# Patient Record
Sex: Female | Born: 1975 | Race: White | Hispanic: No | Marital: Married | Smoking: Former smoker
Health system: Southern US, Community
[De-identification: ages and names within clinical notes are randomized; demographics above are authoritative.]

## PROBLEM LIST (undated history)

## (undated) DIAGNOSIS — I350 Nonrheumatic aortic (valve) stenosis: Secondary | ICD-10-CM

## (undated) DIAGNOSIS — R519 Headache, unspecified: Secondary | ICD-10-CM

## (undated) DIAGNOSIS — Q231 Congenital insufficiency of aortic valve: Secondary | ICD-10-CM

## (undated) DIAGNOSIS — R739 Hyperglycemia, unspecified: Secondary | ICD-10-CM

## (undated) DIAGNOSIS — M1990 Unspecified osteoarthritis, unspecified site: Secondary | ICD-10-CM

## (undated) DIAGNOSIS — I712 Thoracic aortic aneurysm, without rupture: Secondary | ICD-10-CM

## (undated) DIAGNOSIS — E0590 Thyrotoxicosis, unspecified without thyrotoxic crisis or storm: Secondary | ICD-10-CM

## (undated) DIAGNOSIS — J45909 Unspecified asthma, uncomplicated: Secondary | ICD-10-CM

## (undated) DIAGNOSIS — R51 Headache: Secondary | ICD-10-CM

## (undated) DIAGNOSIS — Z8619 Personal history of other infectious and parasitic diseases: Secondary | ICD-10-CM

## (undated) DIAGNOSIS — Q230 Congenital stenosis of aortic valve: Secondary | ICD-10-CM

## (undated) DIAGNOSIS — E042 Nontoxic multinodular goiter: Secondary | ICD-10-CM

## (undated) DIAGNOSIS — Q2381 Bicuspid aortic valve: Secondary | ICD-10-CM

## (undated) DIAGNOSIS — Z8632 Personal history of gestational diabetes: Secondary | ICD-10-CM

## (undated) DIAGNOSIS — R12 Heartburn: Secondary | ICD-10-CM

## (undated) DIAGNOSIS — J302 Other seasonal allergic rhinitis: Secondary | ICD-10-CM

## (undated) DIAGNOSIS — I7121 Aneurysm of the ascending aorta, without rupture: Secondary | ICD-10-CM

## (undated) HISTORY — DX: Personal history of other infectious and parasitic diseases: Z86.19

## (undated) HISTORY — DX: Headache, unspecified: R51.9

## (undated) HISTORY — DX: Nonrheumatic aortic (valve) stenosis: I35.0

## (undated) HISTORY — DX: Unspecified osteoarthritis, unspecified site: M19.90

## (undated) HISTORY — DX: Nontoxic multinodular goiter: E04.2

## (undated) HISTORY — DX: Aneurysm of the ascending aorta, without rupture: I71.21

## (undated) HISTORY — PX: BREAST REDUCTION SURGERY: SHX8

## (undated) HISTORY — DX: Personal history of gestational diabetes: Z86.32

## (undated) HISTORY — DX: Bicuspid aortic valve: Q23.81

## (undated) HISTORY — DX: Unspecified asthma, uncomplicated: J45.909

## (undated) HISTORY — DX: Heartburn: R12

## (undated) HISTORY — DX: Headache: R51

## (undated) HISTORY — DX: Other seasonal allergic rhinitis: J30.2

## (undated) HISTORY — DX: Thyrotoxicosis, unspecified without thyrotoxic crisis or storm: E05.90

## (undated) HISTORY — PX: OTHER SURGICAL HISTORY: SHX169

## (undated) HISTORY — DX: Hyperglycemia, unspecified: R73.9

## (undated) HISTORY — DX: Congenital insufficiency of aortic valve: Q23.1

## (undated) HISTORY — DX: Congenital stenosis of aortic valve: Q23.0

## (undated) HISTORY — DX: Thoracic aortic aneurysm, without rupture: I71.2

---

## 2008-08-29 ENCOUNTER — Inpatient Hospital Stay (HOSPITAL_COMMUNITY): Admission: AD | Admit: 2008-08-29 | Discharge: 2008-08-29 | Payer: Self-pay | Admitting: Obstetrics and Gynecology

## 2008-10-05 ENCOUNTER — Ambulatory Visit: Payer: Self-pay | Admitting: Unknown Physician Specialty

## 2009-04-03 ENCOUNTER — Encounter: Admission: RE | Admit: 2009-04-03 | Discharge: 2009-04-03 | Payer: Self-pay | Admitting: Family Medicine

## 2009-04-03 IMAGING — US US SOFT TISSUE HEAD/NECK
1 series · 14 of 25 positions shown · non-contrast
Comparison: None

CLINICAL DATA: Thyromegaly.

THYROID ULTRASOUND
TECHNIQUE: Ultrasound examination of the thyroid gland and
adjacent soft tissues was performed.

[Series 1: us soft tissue head/neck · 0.07mm/px · 14 of 29 slices shown]
[im 1/29]
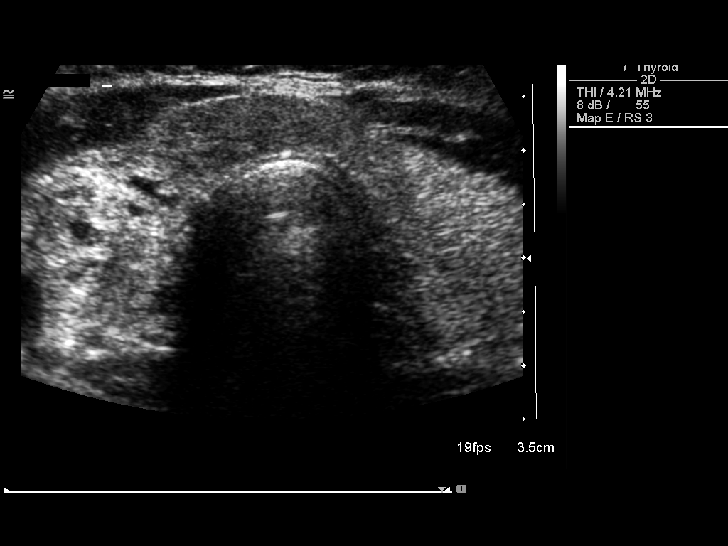
[im 3/29]
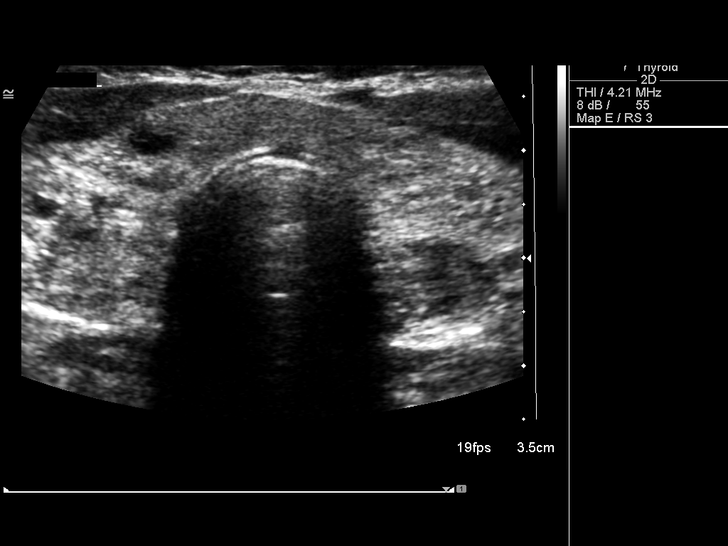
[im 5/29]
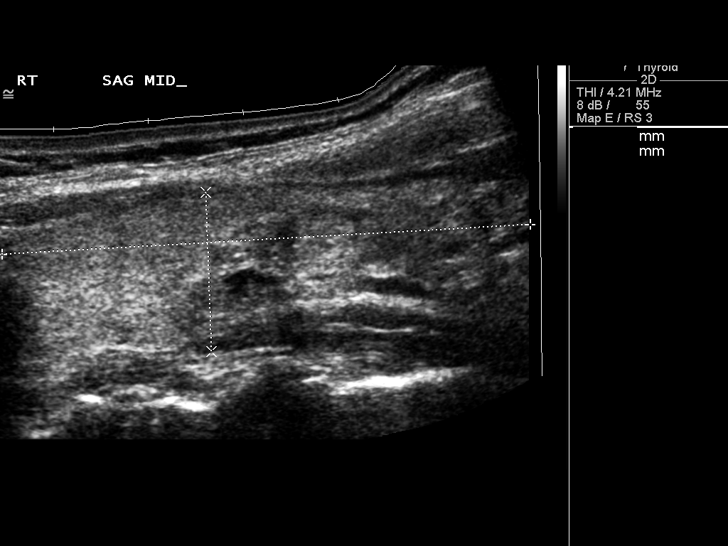
[im 8/29]
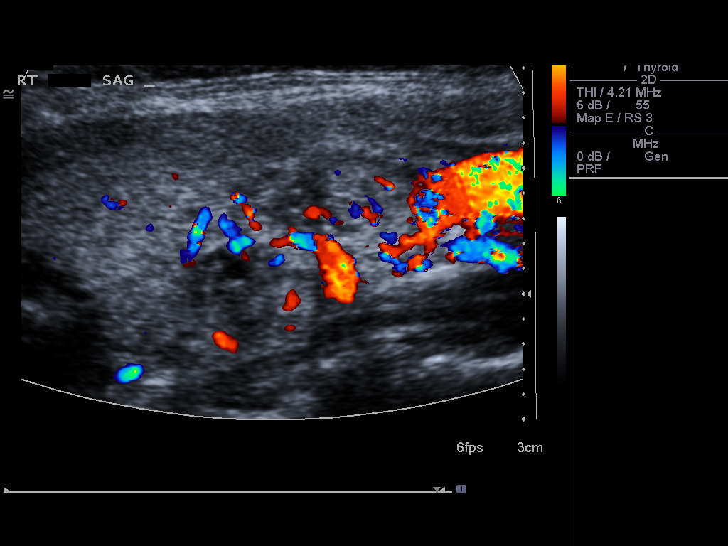
[im 10/29]
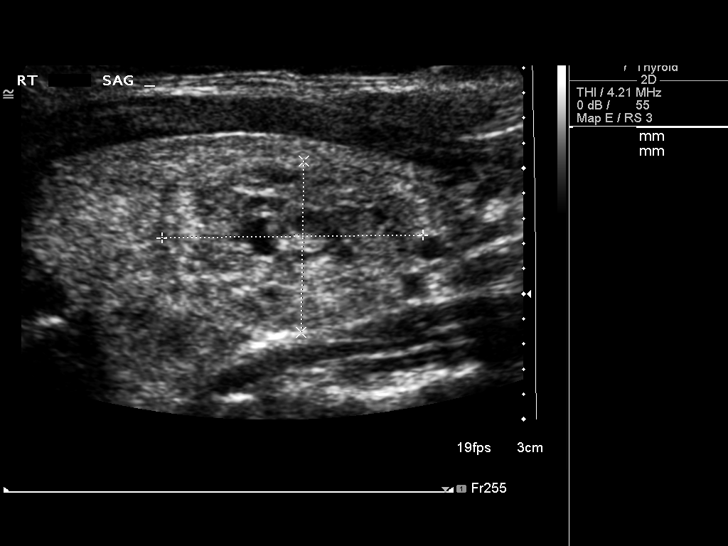
[im 11/29]
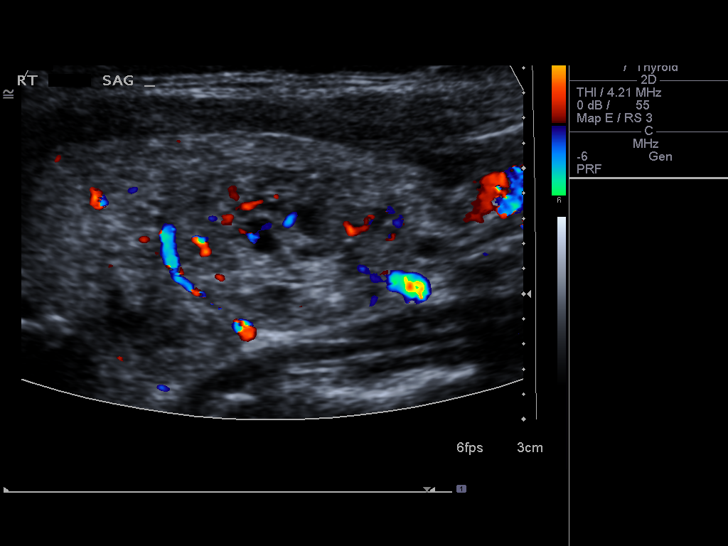
[im 13/29]
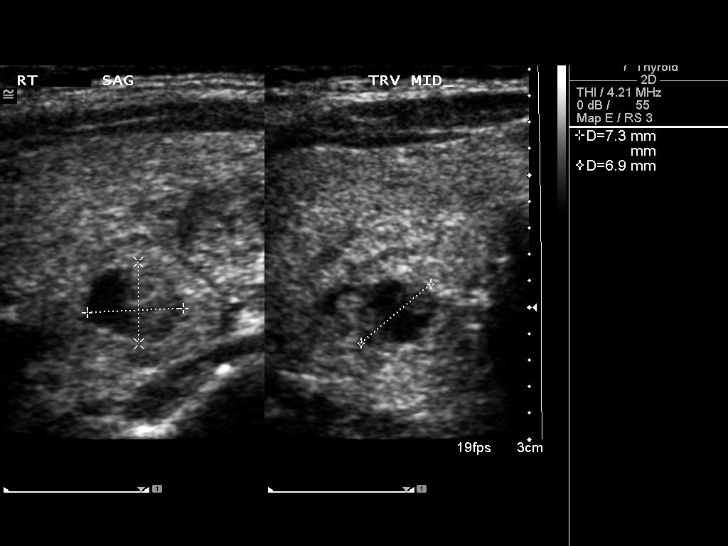
[im 16/29]
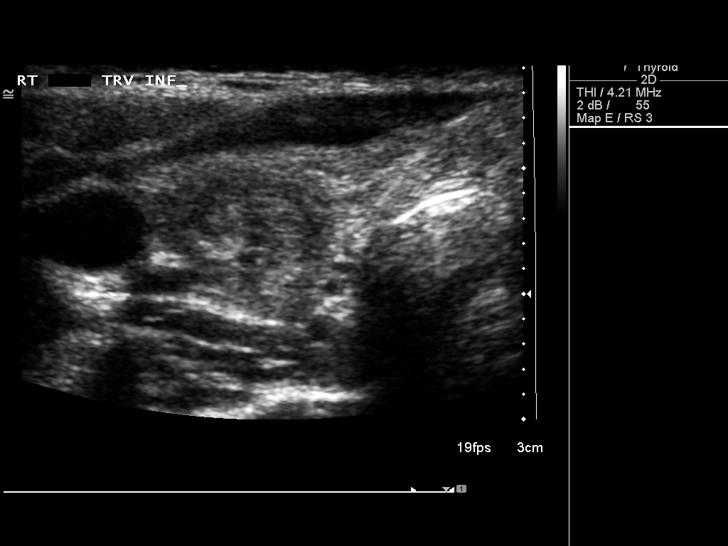
[im 18/29]
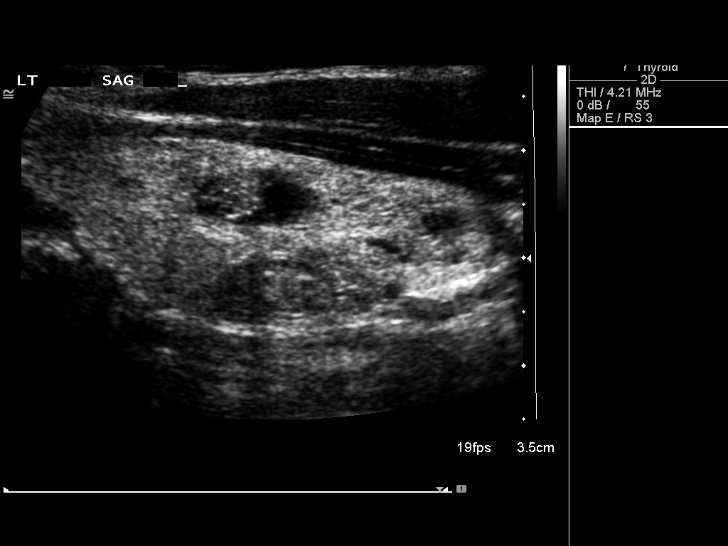
[im 19/29]
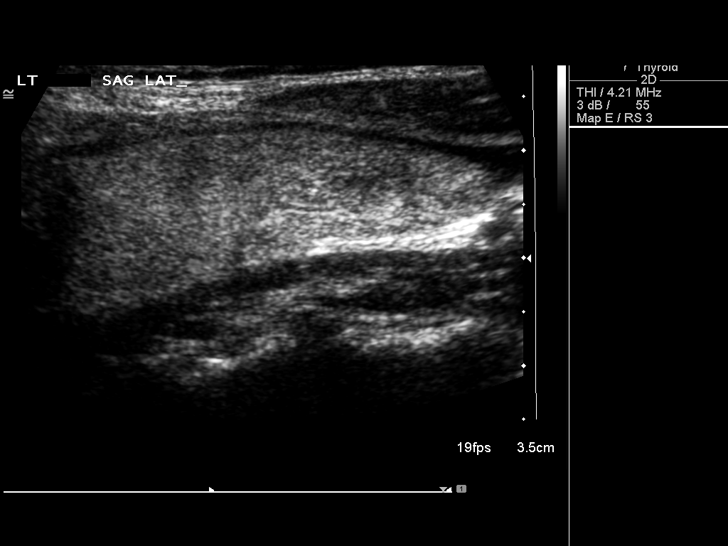
[im 22/29]
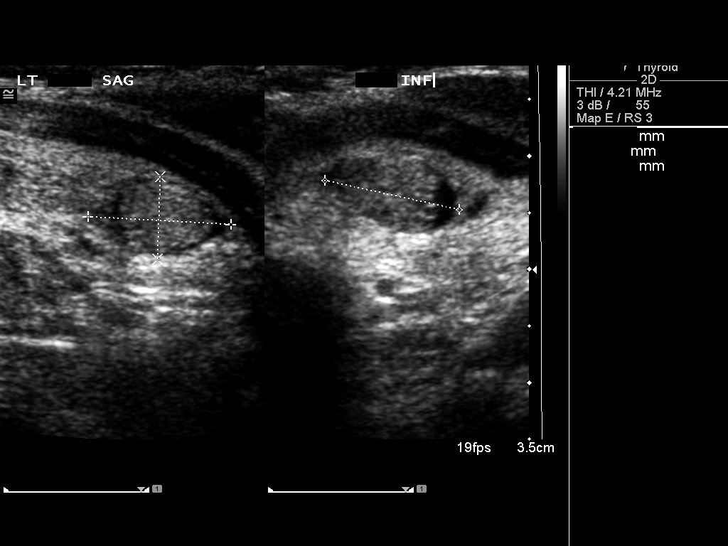
[im 24/29]
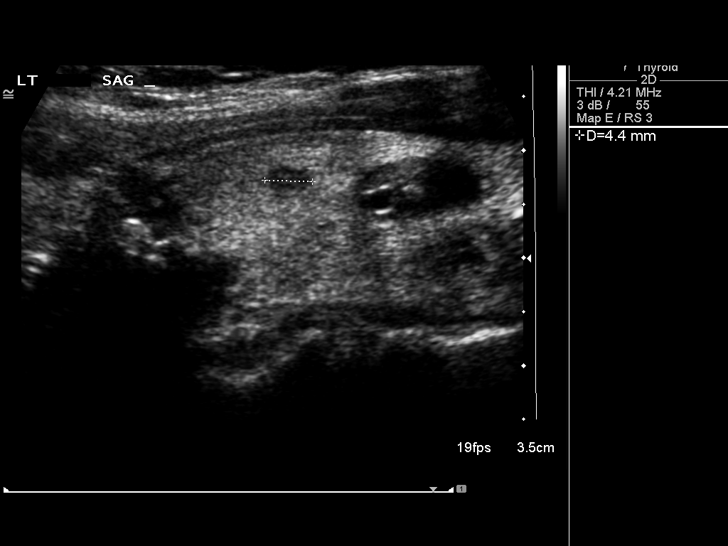
[im 26/29]
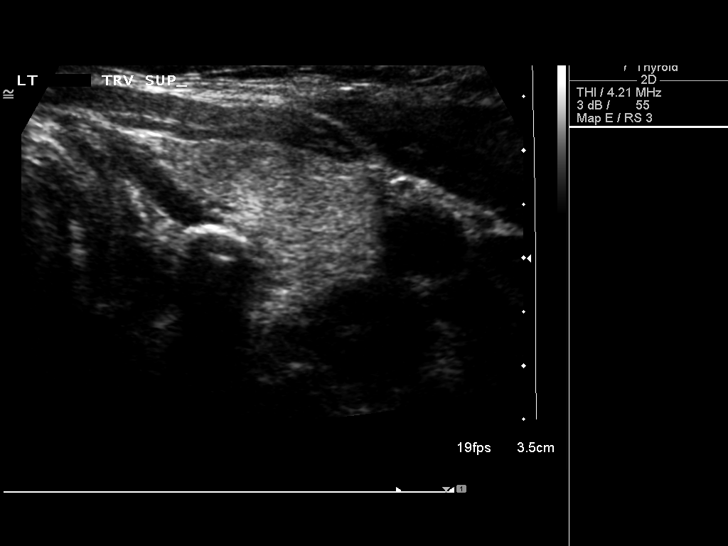
[im 29/29]
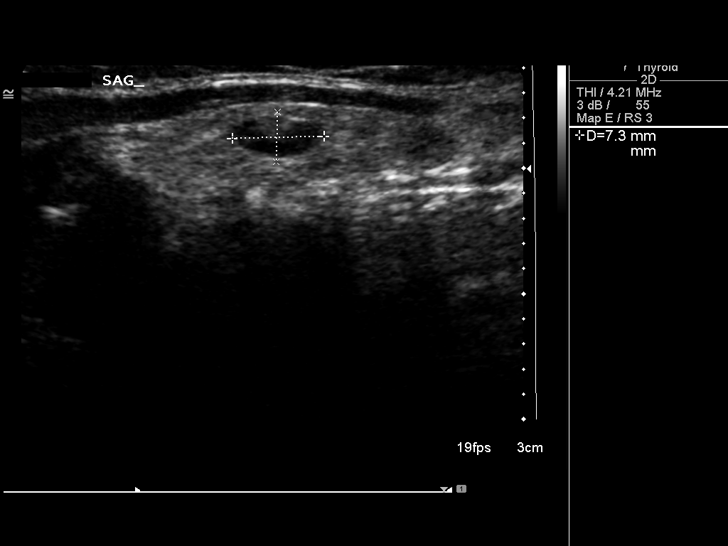

[14 of 25 positions shown; findings below may reference images not displayed]

FINDINGS: The right lobe measures 5.6 x 1.7 x 2.3 cm.  The left
lobe measures 5.3 x 1.9 x 2.1 cm.  The isthmus measures 6 mm.

Thyroid echotexture is diffusely heterogeneous.  Numerous solid and
mixed solid/cystic lesions identified.

The largest right-sided nodule is hypervascular and primarily
solid.  2.1 x 1.4 x 1.8 cm.

A 7 mm isthmic/medial right-sided nodule is hypoechoic and
partially solid. The largest left sided lesion is in the
inter/lower pole region and measures 2.1 x 0.8 x 1.4 cm.  This is
primarily solid with a small amount of cystic component superiorly.

A solid lower pole left sided lesion measures 1.3 x 0.7 x 1.2 cm.

Other smaller lesions identified bilaterally.
IMPRESSION: Numerous thyroid nodules bilaterally.  Most consistent with
multinodular goiter.  None warrant biopsy at this time.  Recommend
ultrasound surveillance in approximately 6 - 12 months to confirm
ongoing stability.

## 2009-05-10 ENCOUNTER — Encounter: Admission: RE | Admit: 2009-05-10 | Discharge: 2009-05-10 | Payer: Self-pay | Admitting: Internal Medicine

## 2009-05-10 ENCOUNTER — Other Ambulatory Visit: Admission: RE | Admit: 2009-05-10 | Discharge: 2009-05-10 | Payer: Self-pay | Admitting: Interventional Radiology

## 2009-05-10 IMAGING — US US BIOPSY
1 series · 9 of 9 positions shown · non-contrast
Comparison: none

CLINICAL DATA: Patient with history of thyromegaly and thyroid
[DATE] which revealed numerous thyroid nodules bilaterally.
There is a dominant nodule the right mid thyroid lobe measuring
x 1.4 x 1.8 cm.  On the left there is a dominant nodule in the
inter/lower pole region measuring 2.1 x 0.8 x 1.4 cm.  Request is
now made for needle aspirate biopsy of both right and left dominant
thyroid nodules.

ULTRASOUND-GUIDED NEEDLE ASPIRATE BIOPSY, DOMINANT RIGHT MID
THYROID NODULE
The above procedure was discussed with the patient and written
informed consent was obtained.

[Series 1: us biopsy · 9 acquisitions, 9 frames shown]
[im 1/9]
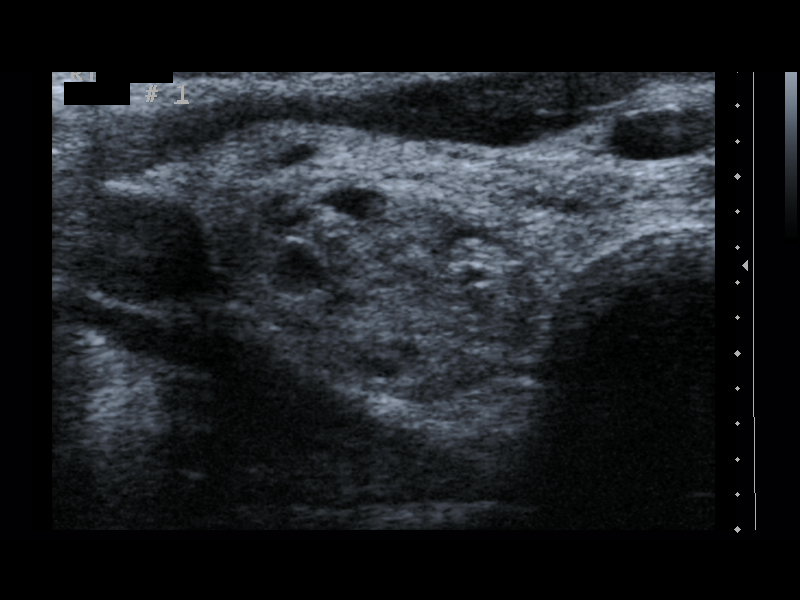
[im 2/9]
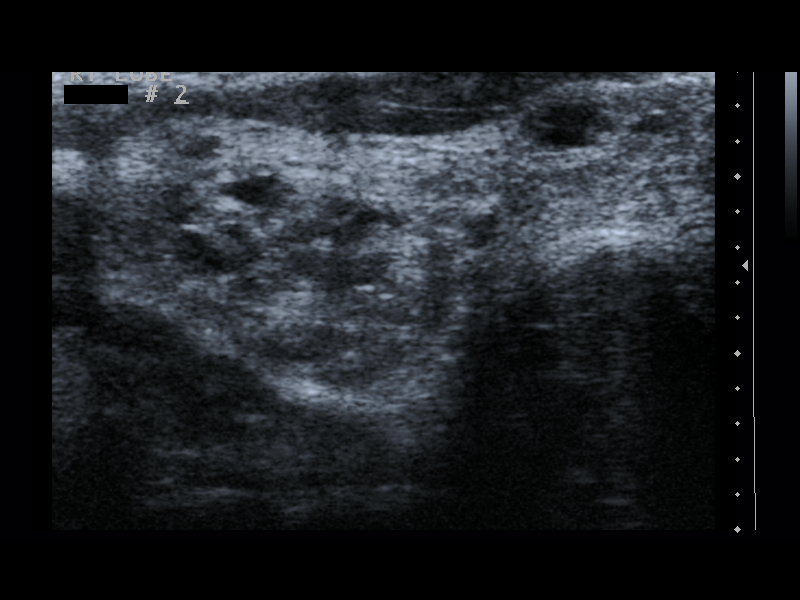
[im 3/9]
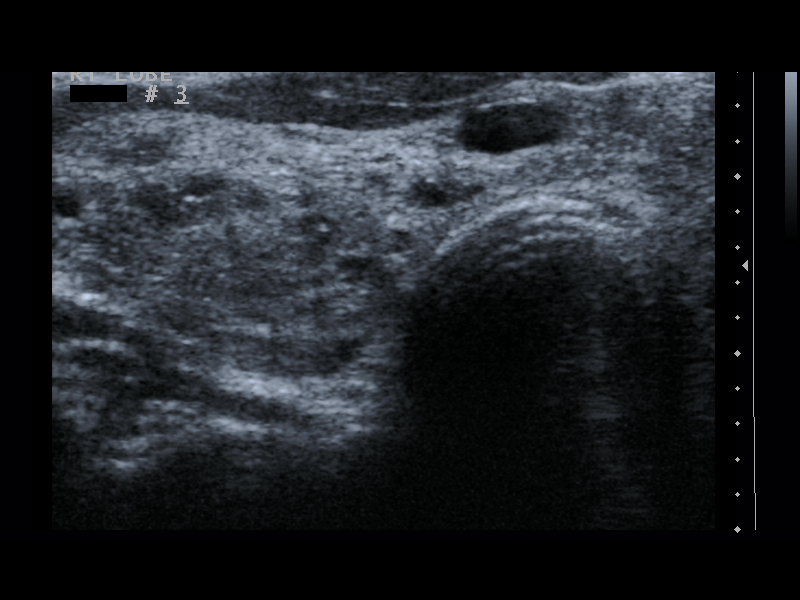
[im 4/9]
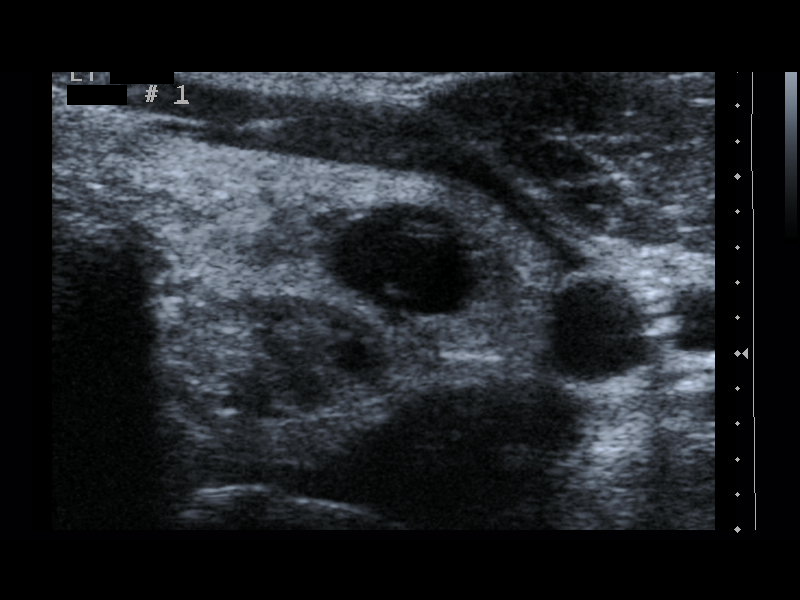
[im 5/9]
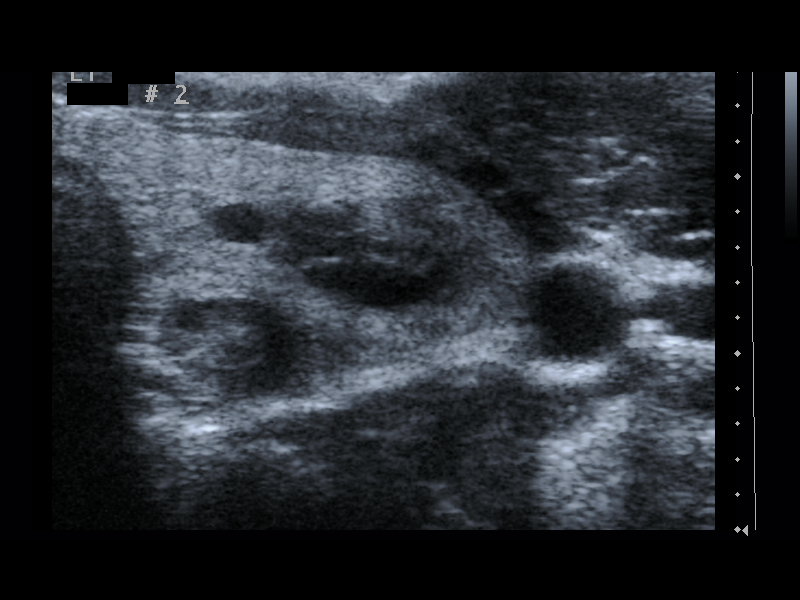
[im 6/9]
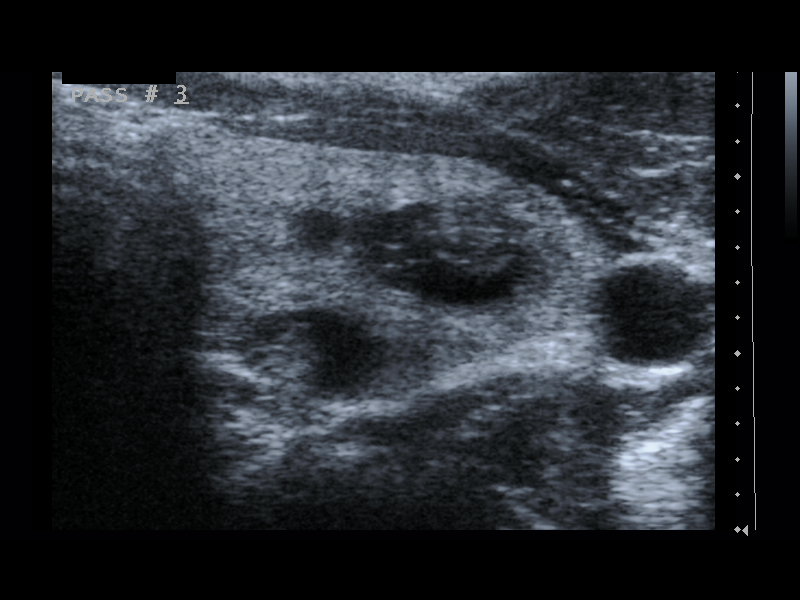
[im 7/9]
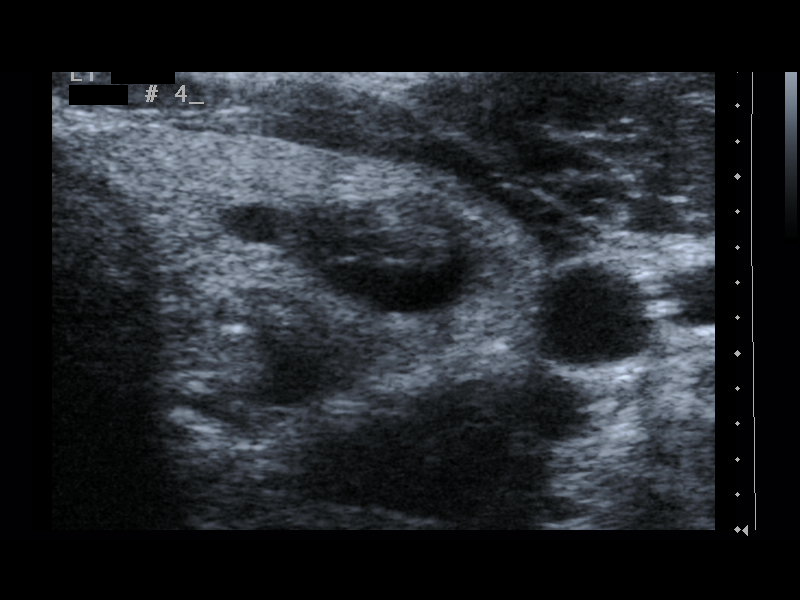
[im 8/9]
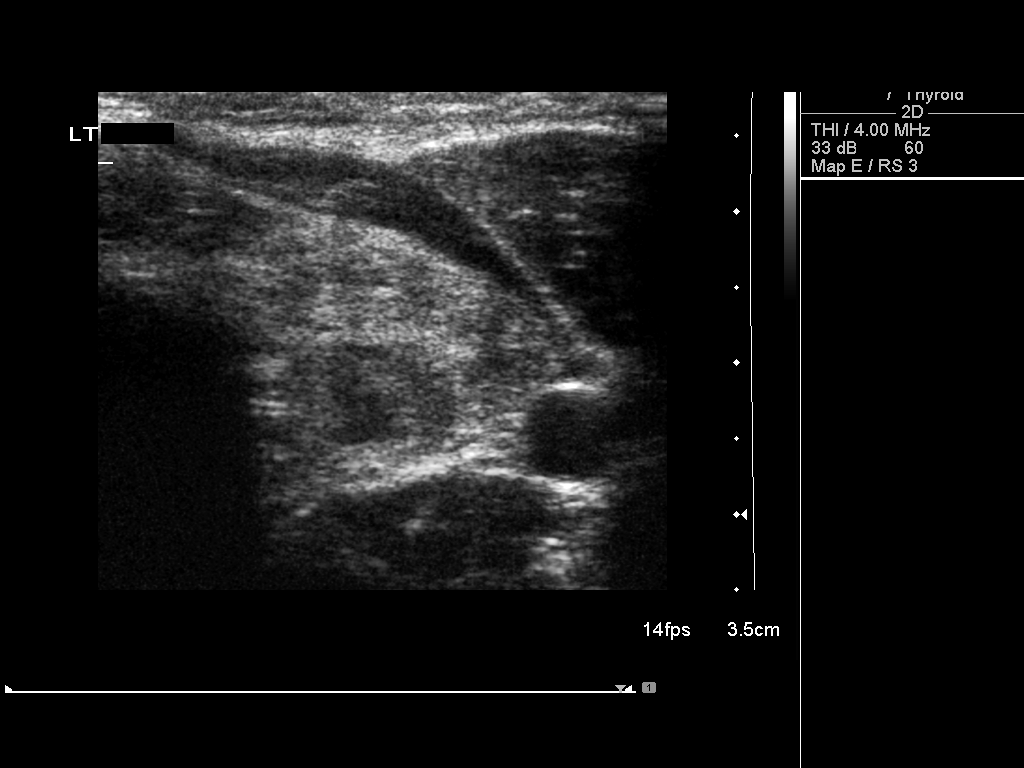
[im 9/9]
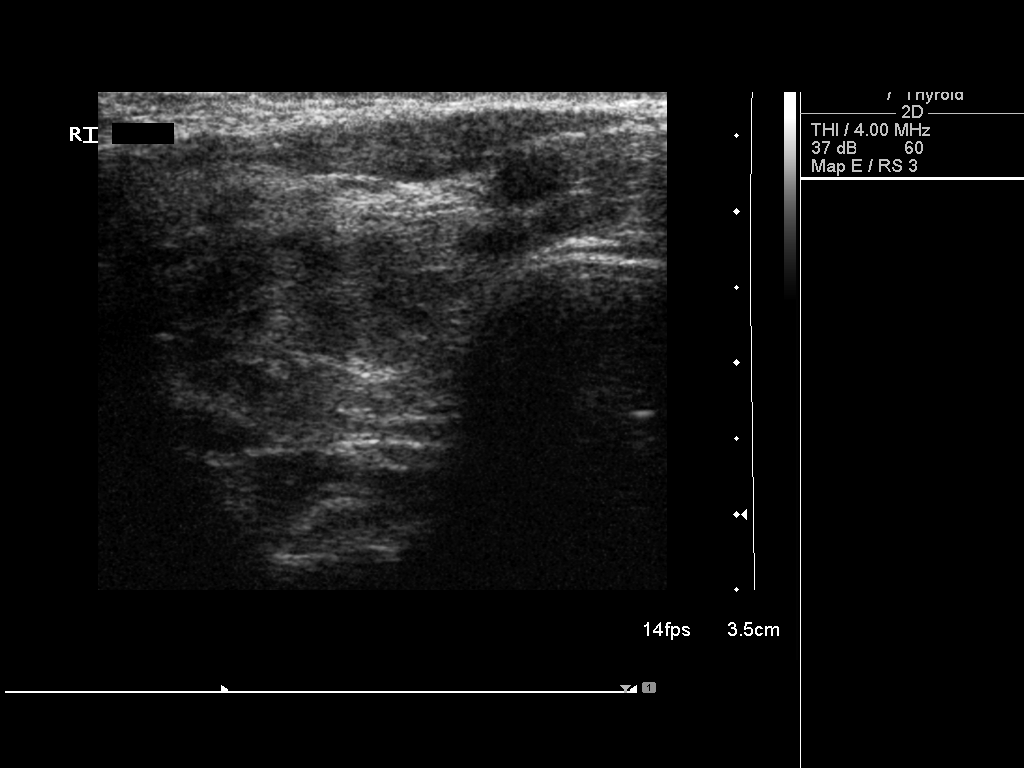

[9 of 9 positions shown; findings below may reference images not displayed]

FINDINGS: Ultrasound was performed to localize and mark an adequate
site for the biopsy.  The patient was then prepped and draped in a
normal sterile fashion.  Local anesthesia was provided with 1%
lidocaine.  Under direct ultrasound guidance, 3 passes were made
using 25 gauge needles into the dominant nodule within the right
mid lobe of the thyroid.  Ultrasound was used to confirm needle
placements on all occasions.  Specimens were sent to Pathology for
analysis.  Post procedural imaging demonstrated no hematoma or
immediate complication.  The patient tolerated the procedure well.

ULTRASOUND GUIDED NEEDLE ASPIRATE BIOPSY , DOMINANT LEFT
INTER/LOWER POLE THYROID NODULE
FINDINGS: Ultrasound was performed to localize and mark an
adequate site for the biopsy.  The patient was then prepped and
draped in the normal sterile fashion.  Local anesthesia was
provided with 1% lidocaine.  Under direct ultrasound guidance , 4
passes were made using 25 gauge needles into the dominant nodule
within the left inter/lower pole of the thyroid.  Ultrasound was
used to confirm needle placements on all occasions.  Specimens were
sent to pathology for analysis.  Postprocedural imaging
demonstrated no hematoma or immediate complication.  The patient
tolerated the procedure well.
IMPRESSION: Successful ultrasound guided needle aspirate biopsies
of  dominant right mid and left lower thyroid nodules.  Pathology
pending.

Read by: CORO.-CORO

## 2009-09-12 ENCOUNTER — Encounter: Admission: RE | Admit: 2009-09-12 | Discharge: 2009-09-12 | Payer: Self-pay | Admitting: Endocrinology

## 2010-01-01 ENCOUNTER — Inpatient Hospital Stay (HOSPITAL_COMMUNITY): Admission: RE | Admit: 2010-01-01 | Discharge: 2010-01-03 | Payer: Self-pay | Admitting: Obstetrics

## 2010-05-10 ENCOUNTER — Ambulatory Visit: Payer: Self-pay | Admitting: Cardiology

## 2010-05-23 ENCOUNTER — Encounter: Payer: Self-pay | Admitting: Cardiology

## 2010-05-23 ENCOUNTER — Ambulatory Visit (HOSPITAL_COMMUNITY)
Admission: RE | Admit: 2010-05-23 | Discharge: 2010-05-23 | Payer: Self-pay | Source: Home / Self Care | Attending: Cardiology | Admitting: Cardiology

## 2010-05-23 ENCOUNTER — Ambulatory Visit: Payer: Self-pay

## 2010-08-25 LAB — CBC
HCT: 34.1 % — ABNORMAL LOW (ref 36.0–46.0)
HCT: 40.1 % (ref 36.0–46.0)
Hemoglobin: 13.5 g/dL (ref 12.0–15.0)
MCH: 31.4 pg (ref 26.0–34.0)
MCV: 94.5 fL (ref 78.0–100.0)
Platelets: 159 10*3/uL (ref 150–400)
Platelets: 172 10*3/uL (ref 150–400)
RBC: 3.61 MIL/uL — ABNORMAL LOW (ref 3.87–5.11)
WBC: 15 10*3/uL — ABNORMAL HIGH (ref 4.0–10.5)

## 2010-08-25 LAB — RPR: RPR Ser Ql: NONREACTIVE

## 2010-08-25 LAB — GLUCOSE, CAPILLARY
Glucose-Capillary: 84 mg/dL (ref 70–99)
Glucose-Capillary: 88 mg/dL (ref 70–99)

## 2010-09-20 LAB — HCG, QUANTITATIVE, PREGNANCY: hCG, Beta Chain, Quant, S: 15 m[IU]/mL — ABNORMAL HIGH (ref <5)

## 2010-09-20 LAB — ABO/RH: ABO/RH(D): A POS

## 2010-09-20 LAB — CBC: Hemoglobin: 14.1 g/dL (ref 12.0–15.0)

## 2015-01-22 ENCOUNTER — Emergency Department: Admission: EM | Admit: 2015-01-22 | Discharge: 2015-01-22 | Discharge status: Absent without leave | Payer: Self-pay

## 2015-01-24 ENCOUNTER — Ambulatory Visit (INDEPENDENT_AMBULATORY_CARE_PROVIDER_SITE_OTHER): Payer: BLUE CROSS/BLUE SHIELD | Admitting: Cardiovascular Disease

## 2015-01-24 ENCOUNTER — Encounter: Payer: Self-pay | Admitting: Cardiovascular Disease

## 2015-01-24 VITALS — BP 116/72 | HR 71 | Ht 66.5 in | Wt 140.2 lb

## 2015-01-24 DIAGNOSIS — R0989 Other specified symptoms and signs involving the circulatory and respiratory systems: Secondary | ICD-10-CM | POA: Diagnosis not present

## 2015-01-24 DIAGNOSIS — R0609 Other forms of dyspnea: Secondary | ICD-10-CM

## 2015-01-24 DIAGNOSIS — Q230 Congenital stenosis of aortic valve: Secondary | ICD-10-CM | POA: Insufficient documentation

## 2015-01-24 DIAGNOSIS — I350 Nonrheumatic aortic (valve) stenosis: Secondary | ICD-10-CM | POA: Insufficient documentation

## 2015-01-24 DIAGNOSIS — Z13220 Encounter for screening for lipoid disorders: Secondary | ICD-10-CM

## 2015-01-24 DIAGNOSIS — Q231 Congenital insufficiency of aortic valve: Secondary | ICD-10-CM | POA: Diagnosis not present

## 2015-01-24 LAB — HEPATIC FUNCTION PANEL
ALK PHOS: 40 U/L (ref 33–115)
ALT: 13 U/L (ref 6–29)
AST: 16 U/L (ref 10–30)
Albumin: 4.4 g/dL (ref 3.6–5.1)
BILIRUBIN DIRECT: 0.1 mg/dL (ref <=0.2)
BILIRUBIN TOTAL: 0.4 mg/dL (ref 0.2–1.2)
Indirect Bilirubin: 0.3 mg/dL (ref 0.2–1.2)
Total Protein: 6.9 g/dL (ref 6.1–8.1)

## 2015-01-24 LAB — LIPID PANEL
CHOL/HDL RATIO: 3.2 ratio (ref <=5.0)
Cholesterol: 165 mg/dL (ref 125–200)
HDL: 52 mg/dL (ref >=46)
LDL Cholesterol: 101 mg/dL (ref <130)
Triglycerides: 58 mg/dL (ref <150)
VLDL: 12 mg/dL (ref <30)

## 2015-01-24 NOTE — Assessment & Plan Note (Signed)
Mr. Vargus has had a murmur since childhood and was followed by Dr. Martinique remotely. She has what is described as bicuspid aortic stenosis and was told at some point she would need to suggest surgically. She has noticed increasing dyspnea on exertion over the last year with some chest heaviness. She has a typical AS murmur. I'm going to order 2-D echocardiogram to further evaluate this.

## 2015-01-24 NOTE — Patient Instructions (Addendum)
Your physician has requested that you have an echocardiogram. Echocardiography is a painless test that uses sound waves to create images of your heart. It provides your doctor with information about the size and shape of your heart and how well your heart's chambers and valves are working. This procedure takes approximately one hour. There are no restrictions for this procedure.  Your physician has requested that you have a carotid duplex. This test is an ultrasound of the carotid arteries in your neck. It looks at blood flow through these arteries that supply the brain with blood. Allow one hour for this exam. There are no restrictions or special instructions.  Your physician recommends that you return for lab work at your earliest [place].   Dr Gwenlyn Found recommends that you schedule a follow-up appointment after the studies have been completed.

## 2015-01-24 NOTE — Progress Notes (Signed)
     01/24/2015 Charlene Carey   1976-04-05  [id number]  Primary Physician No PCP Per Patient Primary Cardiologist: Lorretta Harp MD Renae Gloss   HPI:  Charlene Carey is a very pleasant 39 year old mildly overweight married Caucasian female mother of 3 children and is accompanied by her mother Charlene Carey today. She saw Dr. Martinique in the remote past (2011). There is a history of a murmur from childhood with what she describes as a bicuspid aortic valve. Dr. Martinique said that she would need this addressed at some point in the future. She has no other cardiovascular risk factors. She has noticed increasing dyspnea on exertion when walking up stairs and fatigue with some chest heaviness. She has had no problems with her deliveries. There is no family history of bicuspid aortic valve sclerosis.   No current outpatient prescriptions on file.   No current facility-administered medications for this visit.    Allergies  Allergen Reactions  . Codeine Nausea Only    Has withdrawal when trying to stop med    Social History   Social History  . Marital Status: Married    Spouse Name: N/A  . Number of Children: N/A  . Years of Education: N/A   Occupational History  . Not on file.   Social History Main Topics  . Smoking status: Former Research scientist (life sciences)  . Smokeless tobacco: Never Used  . Alcohol Use: 0.6 oz/week    1 Standard drinks or equivalent per week     Comment: depends on day   . Drug Use: No  . Sexual Activity: Not on file   Other Topics Concern  . Not on file   Social History Narrative  . No narrative on file     Review of Systems: General: negative for chills, fever, night sweats or weight changes.  Cardiovascular: negative for chest pain, dyspnea on exertion, edema, orthopnea, palpitations, paroxysmal nocturnal dyspnea or shortness of breath Dermatological: negative for rash Respiratory: negative for cough or wheezing Urologic: negative for hematuria Abdominal:  negative for nausea, vomiting, diarrhea, bright red blood per rectum, melena, or hematemesis Neurologic: negative for visual changes, syncope, or dizziness All other systems reviewed and are otherwise negative except as noted above.    Blood pressure 116/72, pulse 71, height 5' 6.5" (1.689 m), weight 140 lb 4 oz (63.617 kg).  General appearance: alert and no distress Neck: no adenopathy, no JVD, supple, symmetrical, trachea midline, thyroid not enlarged, symmetric, no tenderness/mass/nodules and bilateral carotid bruits versus transmitted murmur Lungs: clear to auscultation bilaterally Heart: 22-3/6 outflow tract murmur at base consistent with aortic stenosis Extremities: extremities normal, atraumatic, no cyanosis or edema  EKG normal sinus rhythm at 71 ST-T wave changes. I personally reviewed this EKG  ASSESSMENT AND PLAN:   No problem-specific assessment & plan notes found for this encounter.      Lorretta Harp MD FACP,FACC,FAHA, [facility] 01/24/2015 8:17 AM

## 2015-01-26 ENCOUNTER — Telehealth: Payer: Self-pay | Admitting: Cardiovascular Disease

## 2015-01-26 NOTE — Telephone Encounter (Signed)
Returning call from yesterday,think it ws her lab results.

## 2015-01-26 NOTE — Telephone Encounter (Signed)
Communicated results to patient. She verbalized understanding and appreciation for the call.

## 2015-01-31 ENCOUNTER — Other Ambulatory Visit: Payer: Self-pay

## 2015-01-31 ENCOUNTER — Ambulatory Visit (HOSPITAL_COMMUNITY): Payer: BLUE CROSS/BLUE SHIELD | Attending: Cardiovascular Disease

## 2015-01-31 DIAGNOSIS — Q230 Congenital stenosis of aortic valve: Secondary | ICD-10-CM

## 2015-01-31 DIAGNOSIS — I071 Rheumatic tricuspid insufficiency: Secondary | ICD-10-CM | POA: Diagnosis not present

## 2015-01-31 DIAGNOSIS — Z87891 Personal history of nicotine dependence: Secondary | ICD-10-CM | POA: Diagnosis not present

## 2015-01-31 DIAGNOSIS — I371 Nonrheumatic pulmonary valve insufficiency: Secondary | ICD-10-CM | POA: Diagnosis not present

## 2015-01-31 DIAGNOSIS — I340 Nonrheumatic mitral (valve) insufficiency: Secondary | ICD-10-CM | POA: Diagnosis not present

## 2015-01-31 DIAGNOSIS — Q231 Congenital insufficiency of aortic valve: Secondary | ICD-10-CM | POA: Diagnosis not present

## 2015-01-31 DIAGNOSIS — I350 Nonrheumatic aortic (valve) stenosis: Secondary | ICD-10-CM | POA: Insufficient documentation

## 2015-02-03 ENCOUNTER — Ambulatory Visit (HOSPITAL_COMMUNITY)
Admission: RE | Admit: 2015-02-03 | Discharge: 2015-02-03 | Disposition: A | Discharge status: Home / Self Care | Payer: BLUE CROSS/BLUE SHIELD | Source: Ambulatory Visit | Attending: Cardiology | Admitting: Cardiology

## 2015-02-03 DIAGNOSIS — R0989 Other specified symptoms and signs involving the circulatory and respiratory systems: Secondary | ICD-10-CM | POA: Diagnosis not present

## 2015-02-06 ENCOUNTER — Encounter: Payer: Self-pay | Admitting: *Deleted

## 2015-02-14 ENCOUNTER — Encounter: Payer: Self-pay | Admitting: Cardiovascular Disease

## 2015-02-14 ENCOUNTER — Ambulatory Visit (INDEPENDENT_AMBULATORY_CARE_PROVIDER_SITE_OTHER): Payer: BLUE CROSS/BLUE SHIELD | Admitting: Cardiovascular Disease

## 2015-02-14 VITALS — BP 112/78 | HR 80 | Ht 66.0 in | Wt 142.0 lb

## 2015-02-14 DIAGNOSIS — Q231 Congenital insufficiency of aortic valve: Secondary | ICD-10-CM

## 2015-02-14 DIAGNOSIS — I350 Nonrheumatic aortic (valve) stenosis: Secondary | ICD-10-CM

## 2015-02-14 DIAGNOSIS — Q230 Congenital stenosis of aortic valve: Secondary | ICD-10-CM

## 2015-02-14 NOTE — Patient Instructions (Addendum)
Your physician wants you to follow-up in: 6 months with Dr Gwenlyn Found. You will receive a reminder letter in the mail two months in advance. If you don't receive a letter, please call our office to schedule the follow-up appointment.    Echocardiogram (to be done in 6 months). Echocardiography is a painless test that uses sound waves to create images of your heart. It provides your doctor with information about the size and shape of your heart and how well your heart's chambers and valves are working. This procedure takes approximately one hour. There are no restrictions for this procedure. This test is performed at [address] 3rd floor.

## 2015-02-14 NOTE — Progress Notes (Signed)
History of bicuspid aortic stenosis with recent 2-D echo revealed moderate AS the bicuspid aortic valve. The aortic valve area was measured at 1.06 cm2 with a peak gradient of 44 mmHg.  The LV function was normal. 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 She does have mild dyspnea on exertion. We'll continue to follow despite 2-D echo again in 6 months. We had a long discussion about aortic valve replacement at some point in the future and the risks and benefits of tissue versus mechanical prosthesis.   Lorretta Harp, M.D., [place], [facility], [place] [address] [place], [place]  [address]  [phone] 02/14/2015 10:18 AM

## 2015-02-14 NOTE — Assessment & Plan Note (Signed)
History of bicuspid aortic stenosis with recent 2-D echo revealed moderate AS the bicuspid aortic valve. The aortic valve area was measured at 1.06 cm2 with a peak gradient of 44 mmHg.  The LV function was normal. 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 She does have mild dyspnea on exertion. We'll continue to follow despite 2-D echo again in 6 months. We had a long discussion about aortic valve replacement at some point in the future and the risks and benefits of tissue versus mechanical prosthesis.

## 2016-10-16 ENCOUNTER — Encounter: Payer: Self-pay | Admitting: Cardiovascular Disease

## 2016-10-16 ENCOUNTER — Ambulatory Visit (INDEPENDENT_AMBULATORY_CARE_PROVIDER_SITE_OTHER): Payer: BLUE CROSS/BLUE SHIELD | Admitting: Cardiovascular Disease

## 2016-10-16 VITALS — BP 118/78 | HR 88 | Ht 66.5 in | Wt 154.0 lb

## 2016-10-16 DIAGNOSIS — R0609 Other forms of dyspnea: Secondary | ICD-10-CM

## 2016-10-16 DIAGNOSIS — Q231 Congenital insufficiency of aortic valve: Secondary | ICD-10-CM

## 2016-10-16 DIAGNOSIS — R0789 Other chest pain: Secondary | ICD-10-CM | POA: Insufficient documentation

## 2016-10-16 DIAGNOSIS — Q230 Congenital stenosis of aortic valve: Secondary | ICD-10-CM | POA: Diagnosis not present

## 2016-10-16 MED ORDER — PANTOPRAZOLE SODIUM 40 MG PO TBEC: 40.0000 mg | DELAYED_RELEASE_TABLET | Freq: Every day | ORAL | 11 refills | Status: DC

## 2016-10-16 NOTE — Progress Notes (Signed)
10/16/2016 KRISTINIA LEAVY   09-Dec-1975  [id number]  Primary Physician Patient, No Pcp Per Primary Cardiologist: Lorretta Harp MD Renae Gloss  HPI:  Mrs. Idler is a very pleasant 41 year old mildly overweight married Caucasian female mother of 3 children and is accompanied by her son [name] today. She saw Dr. Martinique in the remote past (2011). I last saw her in the office 02/14/15. There is a history of a murmur from childhood with what she describes as a bicuspid aortic valve. Dr. Martinique said that she would need this addressed at some point in the future. She has no other cardiovascular risk factors. She has noticed increasing dyspnea on exertion when walking up stairs and fatigue with some chest heaviness. She has had no problems with her deliveries. There is no family history of bicuspid aortic valve sclerosis. Since I saw her last she's gained approximately 15 pounds. She does complain of increasing dyspnea on exertion and some chest burning which she attributes to repeat reflux.   Current Outpatient Prescriptions  Medication Sig Dispense Refill  . cetirizine (ZYRTEC) 10 MG tablet Take 10 mg by mouth daily.    . pantoprazole (PROTONIX) 40 MG tablet Take 1 tablet (40 mg total) by mouth daily. 30 tablet 11   No current facility-administered medications for this visit.     Allergies  Allergen Reactions  . Codeine Nausea Only    Has withdrawal when trying to stop med    Social History   Social History  . Marital status: Married    Spouse name: N/A  . Number of children: N/A  . Years of education: N/A   Occupational History  . Not on file.   Social History Main Topics  . Smoking status: Former Research scientist (life sciences)  . Smokeless tobacco: Never Used  . Alcohol use 0.6 oz/week    1 Standard drinks or equivalent per week     Comment: depends on day   . Drug use: No  . Sexual activity: Not on file   Other Topics Concern  . Not on file   Social History Narrative  .  No narrative on file     Review of Systems: General: negative for chills, fever, night sweats or weight changes.  Cardiovascular: negative for chest pain, dyspnea on exertion, edema, orthopnea, palpitations, paroxysmal nocturnal dyspnea or shortness of breath Dermatological: negative for rash Respiratory: negative for cough or wheezing Urologic: negative for hematuria Abdominal: negative for nausea, vomiting, diarrhea, bright red blood per rectum, melena, or hematemesis Neurologic: negative for visual changes, syncope, or dizziness All other systems reviewed and are otherwise negative except as noted above.    Blood pressure 118/78, pulse 88, height 5' 6.5" (1.689 m), weight 154 lb (69.9 kg).  General appearance: alert and no distress Neck: no adenopathy, no JVD, supple, symmetrical, trachea midline, thyroid not enlarged, symmetric, no tenderness/mass/nodules and Soft bilateral carotid bruits Lungs: clear to auscultation bilaterally Heart: Soft outflow tract murmur consistent with aortic stenosis Extremities: extremities normal, atraumatic, no cyanosis or edema  EKG sinus rhythm at 88 without ST or T-wave changes. I personally reviewed this EKG  ASSESSMENT AND PLAN:   Aortic stenosis with bicuspid valve History of bicuspid aortic stenosis with echo performed 01/31/15 revealing normal LV systolic function with an aortic valve area of 1.05 cm, peak gradient of 44 mmHg. She has noticed increasing dyspnea on exertion and some chest burning which she appeared to reflux. I'm going to repeat a 2-D echocardiogram.  Atypical  chest pain Increase and recent atypical chest pain which she treated to reflux. She really has no cardiac risk factors. Her father however did have a myocardial infarction last week at age 63. I am going to put her on Protonix empirically and we'll see her back  Dyspnea on exertion Increased dyspnea on exertion recently with known bicuspid aortic stenosis. We'll recheck a  2-D echocardiogram      Lorretta Harp MD [facility], [facility] 10/16/2016 1:01 PM

## 2016-10-16 NOTE — Assessment & Plan Note (Signed)
History of bicuspid aortic stenosis with echo performed 01/31/15 revealing normal LV systolic function with an aortic valve area of 1.05 cm, peak gradient of 44 mmHg. She has noticed increasing dyspnea on exertion and some chest burning which she appeared to reflux. I'm going to repeat a 2-D echocardiogram.

## 2016-10-16 NOTE — Assessment & Plan Note (Signed)
Increased dyspnea on exertion recently with known bicuspid aortic stenosis. We'll recheck a 2-D echocardiogram

## 2016-10-16 NOTE — Patient Instructions (Signed)
Medication Instructions: START Protonix 40 mg daily.   Labwork: Your physician recommends that you return for FASTING lab work.   Testing/Procedures: Your physician has requested that you have an echocardiogram. Echocardiography is a painless test that uses sound waves to create images of your heart. It provides your doctor with information about the size and shape of your heart and how well your heart's chambers and valves are working. This procedure takes approximately one hour. There are no restrictions for this procedure.  Follow-Up: Your physician recommends that you schedule a follow-up appointment in: 2-3 weeks with Dr. Gwenlyn Found.  If you need a refill on your cardiac medications before your next appointment, please call your pharmacy.

## 2016-10-16 NOTE — Assessment & Plan Note (Signed)
Increase and recent atypical chest pain which she treated to reflux. She really has no cardiac risk factors. Her father however did have a myocardial infarction last week at age 43. I am going to put her on Protonix empirically and we'll see her back

## 2016-10-18 LAB — CBC WITH DIFFERENTIAL/PLATELET
BASOS ABS: 57 {cells}/uL (ref 0–200)
BASOS PCT: 1 %
EOS ABS: 0 {cells}/uL — AB (ref 15–500)
Eosinophils Relative: 0 %
HEMATOCRIT: 41.6 % (ref 35.0–45.0)
Hemoglobin: 13.8 g/dL (ref 11.7–15.5)
LYMPHS PCT: 33 %
Lymphs Abs: 1881 cells/uL (ref 850–3900)
MCH: 30.2 pg (ref 27.0–33.0)
MCHC: 33.2 g/dL (ref 32.0–36.0)
MCV: 91 fL (ref 80.0–100.0)
MPV: 11.4 fL (ref 7.5–12.5)
Monocytes Absolute: 399 cells/uL (ref 200–950)
Monocytes Relative: 7 %
NEUTROS PCT: 59 %
Neutro Abs: 3363 cells/uL (ref 1500–7800)
Platelets: 252 10*3/uL (ref 140–400)
RBC: 4.57 MIL/uL (ref 3.80–5.10)
RDW: 13 % (ref 11.0–15.0)
WBC: 5.7 10*3/uL (ref 3.8–10.8)

## 2016-10-18 LAB — HEPATIC FUNCTION PANEL
ALK PHOS: 39 U/L (ref 33–115)
ALT: 12 U/L (ref 6–29)
AST: 15 U/L (ref 10–30)
Albumin: 4.4 g/dL (ref 3.6–5.1)
BILIRUBIN DIRECT: 0.1 mg/dL (ref <=0.2)
BILIRUBIN INDIRECT: 0.4 mg/dL (ref 0.2–1.2)
Total Bilirubin: 0.5 mg/dL (ref 0.2–1.2)
Total Protein: 6.8 g/dL (ref 6.1–8.1)

## 2016-10-18 LAB — BASIC METABOLIC PANEL WITH GFR
BUN: 11 mg/dL (ref 7–25)
CALCIUM: 9.6 mg/dL (ref 8.6–10.2)
CO2: 26 mmol/L (ref 20–31)
CREATININE: 0.82 mg/dL (ref 0.50–1.10)
Chloride: 106 mmol/L (ref 98–110)
GFR, Est African American: >89 mL/min (ref >=60)
GFR, Est Non African American: 88 mL/min (ref >=60)
GLUCOSE: 88 mg/dL (ref 65–99)
Potassium: 5.3 mmol/L (ref 3.5–5.3)
SODIUM: 139 mmol/L (ref 135–146)

## 2016-10-18 LAB — LIPID PANEL
CHOL/HDL RATIO: 3.3 ratio (ref <5.0)
CHOLESTEROL: 161 mg/dL (ref <200)
HDL: 49 mg/dL — ABNORMAL LOW (ref >50)
LDL Cholesterol: 96 mg/dL (ref <100)
Triglycerides: 80 mg/dL (ref <150)
VLDL: 16 mg/dL (ref <30)

## 2016-10-18 LAB — T4, FREE: Free T4: 1.4 ng/dL (ref 0.8–1.8)

## 2016-10-18 LAB — TSH: TSH: 0.34 m[IU]/L — AB

## 2016-10-19 LAB — HEMOGLOBIN A1C
Hgb A1c MFr Bld: 5.2 % (ref <5.7)
MEAN PLASMA GLUCOSE: 103 mg/dL

## 2016-10-19 LAB — PROTIME-INR
INR: 1
PROTHROMBIN TIME: 10.4 s (ref 9.0–11.5)

## 2016-10-21 ENCOUNTER — Telehealth: Payer: Self-pay | Admitting: Cardiovascular Disease

## 2016-10-21 NOTE — Telephone Encounter (Signed)
Protime-INR  Order: [id number]  Status:  Final result Visible to patient:  No (Not Released) Dx:  Aortic stenosis with bicuspid valve; ...  Notes recorded by Therisa Doyne on 10/21/2016 at 12:48 PM EDT Left detail message with results, ok per DPR, and to call back if any questions.  Asked pt to call back with PCP information if she has one. ------  Notes recorded by Lorretta Harp, MD on 10/21/2016 at 9:16 AM EDT Acceptable FLP for primary prevention. TSH low. Forward to PCP to address

## 2016-10-21 NOTE — Telephone Encounter (Signed)
Lipid panel  Order: [id number]  Status:  Final result Visible to patient:  No (Not Released) Dx:  Aortic stenosis with bicuspid valve; ...  Notes recorded by Therisa Doyne on 10/21/2016 at 1:16 PM EDT Spoke with pt. She stated that she does not currently have a PCP but to send her the lab work to take to a PCP when she finds one. Pt stated she has been having more chest tightness lately as she had explained to Dr. Gwenlyn Found at last appt. I advised pt to go to emergency room if she has severe tightness, pain in jaw or left arm or any other symptoms such as SOB, dizziness, sweating associated with the chest tightness. She verbalized understanding. I reminded her of her ECHO appt on 10/29/16 and explained that this will look at the size and function of her heart and based on those results, Dr. Gwenlyn Found will decide if anything else to do. Pt verbalized thanks. ------  Notes recorded by Therisa Doyne on 10/21/2016 at 12:48 PM EDT Left detail message with results, ok per DPR, and to call back if any questions.  Asked pt to call back with PCP information if she has one. ------  Notes recorded by Lorretta Harp, MD on 10/21/2016 at 9:16 AM EDT Acceptable FLP for primary prevention. TSH low. Forward to PCP to address

## 2016-10-28 ENCOUNTER — Telehealth (HOSPITAL_COMMUNITY): Payer: Self-pay | Admitting: Cardiovascular Disease

## 2016-10-28 NOTE — Telephone Encounter (Signed)
Pt returned my call in regards to rescheduling her echo appt due to a tech having a death in the family. Patient called with some concerns as far as when she exerts a moderate amount of energy(climbing a flight of stairs) she has increased SOB followed back pain in her back. She also voiced occasional a dull achyness/tingling feeling in both arms. As of yesterday she noticed a bruise on her rt ankle without any injury. She would like to speak with a nurse to be advised if these symptoms are alarming and if they should be addressed immediately.   Thanks

## 2016-10-28 NOTE — Telephone Encounter (Signed)
Spoke with the patient. She stated that everything was finalized for her ECHO tomorrow and she did not need anything further as of right now.

## 2016-10-29 ENCOUNTER — Ambulatory Visit (HOSPITAL_COMMUNITY): Payer: BLUE CROSS/BLUE SHIELD | Attending: Cardiovascular Disease

## 2016-10-29 ENCOUNTER — Other Ambulatory Visit: Payer: Self-pay

## 2016-10-29 DIAGNOSIS — R0789 Other chest pain: Secondary | ICD-10-CM

## 2016-10-29 DIAGNOSIS — Q230 Congenital stenosis of aortic valve: Secondary | ICD-10-CM | POA: Diagnosis present

## 2016-10-29 DIAGNOSIS — Q231 Congenital insufficiency of aortic valve: Secondary | ICD-10-CM

## 2016-10-29 DIAGNOSIS — I350 Nonrheumatic aortic (valve) stenosis: Secondary | ICD-10-CM | POA: Insufficient documentation

## 2016-10-30 ENCOUNTER — Other Ambulatory Visit: Payer: Self-pay | Admitting: Cardiovascular Disease

## 2016-10-30 DIAGNOSIS — Q231 Congenital insufficiency of aortic valve: Principal | ICD-10-CM

## 2016-10-30 DIAGNOSIS — Q230 Congenital stenosis of aortic valve: Secondary | ICD-10-CM

## 2016-11-08 ENCOUNTER — Ambulatory Visit (INDEPENDENT_AMBULATORY_CARE_PROVIDER_SITE_OTHER): Payer: BLUE CROSS/BLUE SHIELD | Admitting: Cardiovascular Disease

## 2016-11-08 ENCOUNTER — Encounter: Payer: Self-pay | Admitting: Cardiovascular Disease

## 2016-11-08 VITALS — BP 106/76 | HR 86 | Ht 66.0 in | Wt 153.0 lb

## 2016-11-08 DIAGNOSIS — I350 Nonrheumatic aortic (valve) stenosis: Secondary | ICD-10-CM

## 2016-11-08 DIAGNOSIS — R0609 Other forms of dyspnea: Secondary | ICD-10-CM

## 2016-11-08 LAB — CBC WITH DIFFERENTIAL/PLATELET
Basophils Absolute: 0 10*3/uL (ref 0.0–0.2)
Basos: 1 %
EOS (ABSOLUTE): 0 10*3/uL (ref 0.0–0.4)
Eos: 0 %
Hematocrit: 42.5 % (ref 34.0–46.6)
Hemoglobin: 14.7 g/dL (ref 11.1–15.9)
Immature Grans (Abs): 0 10*3/uL (ref 0.0–0.1)
Immature Granulocytes: 0 %
Lymphocytes Absolute: 1.8 10*3/uL (ref 0.7–3.1)
Lymphs: 31 %
MCH: 30.8 pg (ref 26.6–33.0)
MCHC: 34.6 g/dL (ref 31.5–35.7)
MCV: 89 fL (ref 79–97)
Monocytes Absolute: 0.5 10*3/uL (ref 0.1–0.9)
Monocytes: 9 %
Neutrophils Absolute: 3.3 10*3/uL (ref 1.4–7.0)
Neutrophils: 59 %
Platelets: 233 10*3/uL (ref 150–379)
RBC: 4.77 x10E6/uL (ref 3.77–5.28)
RDW: 13 % (ref 12.3–15.4)
WBC: 5.7 10*3/uL (ref 3.4–10.8)

## 2016-11-08 LAB — BASIC METABOLIC PANEL
BUN / CREAT RATIO: 13 (ref 9–23)
BUN: 11 mg/dL (ref 6–24)
CO2: 25 mmol/L (ref 18–29)
CREATININE: 0.85 mg/dL (ref 0.57–1.00)
Calcium: 10 mg/dL (ref 8.7–10.2)
Chloride: 102 mmol/L (ref 96–106)
GFR, EST AFRICAN AMERICAN: 97 mL/min/{1.73_m2} (ref >59)
GFR, EST NON AFRICAN AMERICAN: 84 mL/min/{1.73_m2} (ref >59)
Glucose: 94 mg/dL (ref 65–99)
Potassium: 4.8 mmol/L (ref 3.5–5.2)
SODIUM: 139 mmol/L (ref 134–144)

## 2016-11-08 LAB — PROTIME-INR
INR: 1 (ref 0.8–1.2)
Prothrombin Time: 10.4 s (ref 9.1–12.0)

## 2016-11-08 LAB — APTT: aPTT: 26 s (ref 24–33)

## 2016-11-08 NOTE — Assessment & Plan Note (Signed)
Charlene Carey returns today for follow-up for 2-D echo performed in evaluation of dyspnea and chest pain. Her LV function was normal. Aortic stenosis has progressed now to the moderate range with a peak gradient of 54 mmHg and a mean of 35. Valve area was 0.89 cm. Based on this and her symptoms I decided to proceed with outpatient right and left heart cath. The patient understands that risks included but are not limited to stroke (1 in 1000), death (1 in 39), kidney failure [usually temporary] (1 in 500), bleeding (1 in 200), allergic reaction [possibly serious] (1 in 200). The patient understands and agrees to proceed

## 2016-11-08 NOTE — Patient Instructions (Signed)
   [facility] [address] [place] [address] Dept: [phone] Loc: [phone]  Charlene Carey  11/08/2016  You are scheduled for a Cardiac Catheterization on Monday, June 11 with Dr. Quay Burow.  1. Please arrive at the [facility] (Main Entrance A) at [facility]: [address] [place], [place] [address] at 9:30 AM (two hours before your procedure to ensure your preparation). Free valet parking service is available.   Special note: Every effort is made to have your procedure done on time. Please understand that emergencies sometimes delay scheduled procedures.  2. Diet: Do not eat or drink anything after midnight prior to your procedure except sips of water to take medications.  3. Labs: You will need to have blood drawn on Monday, June 4 at [address], [place]  Open: 8am - 4:30pm (Lunch 12:30 - 1:30). You do not need to be fasting.  4. Medication instructions in preparation for your procedure:  On the morning of your procedure, take any morning medicines NOT listed above.  You may use sips of water.  5. Plan for one night stay--bring personal belongings. 6. Bring a current list of your medications and current insurance cards. 7. You MUST have a responsible person to drive you home. 8. Someone MUST be with you the first 24 hours after you arrive home or your discharge will be delayed. 9. Please wear clothes that are easy to get on and off and wear slip-on shoes.  Thank you for allowing [place] to care for you!   -- [facility]

## 2016-11-08 NOTE — Progress Notes (Signed)
11/08/2016 Charlene Carey   1976/03/04  [id number]  Primary Physician Patient, No Pcp Per Primary Cardiologist: Lorretta Harp MD Renae Gloss  HPI:  Charlene Carey is a very pleasant 41 year old mildly overweight married Caucasian female mother of 3 children and is accompanied by her son Charlene Carey today. She saw Dr. Martinique in the remote past (2011). I last saw her in the office 02/14/15. There is a history of a murmur from childhood with what she describes as a bicuspid aortic valve. Dr. Martinique said that she would need this addressed at some point in the future. She has no other cardiovascular risk factors. She has noticed increasing dyspnea on exertion when walking up stairs and fatigue with some chest heaviness. She has had no problems with her deliveries. There is no family history of bicuspid aortic valve sclerosis. Since I saw her last she's gained approximately 15 pounds. She does complain of increasing dyspnea on exertion and some chest burning which she attributes to repeat reflux. A 2-D echocardiogram performed 10/29/16 revealed normal LV function with progression of her aortic stenosis now in the moderate range. Gradient was 54 mmHg with a mean of 35 and a valve area 0.89 cm. She recently was at [facility] and  when trying to keep up with her children and husband noticed increasing dyspnea and chest. Burning rating to her back. Based on this we decided to proceed with outpatient right and left heart cath to define her anatomy and physiology.   Current Outpatient Prescriptions  Medication Sig Dispense Refill  . cetirizine (ZYRTEC) 10 MG tablet Take 10 mg by mouth daily.    . pantoprazole (PROTONIX) 40 MG tablet Take 1 tablet (40 mg total) by mouth daily. 30 tablet 11   No current facility-administered medications for this visit.     Allergies  Allergen Reactions  . Codeine Nausea Only    Has withdrawal when trying to stop med    Social History   Social  History  . Marital status: Married    Spouse name: N/A  . Number of children: N/A  . Years of education: N/A   Occupational History  . Not on file.   Social History Main Topics  . Smoking status: Former Research scientist (life sciences)  . Smokeless tobacco: Never Used  . Alcohol use 0.6 oz/week    1 Standard drinks or equivalent per week     Comment: depends on day   . Drug use: No  . Sexual activity: Not on file   Other Topics Concern  . Not on file   Social History Narrative  . No narrative on file     Review of Systems: General: negative for chills, fever, night sweats or weight changes.  Cardiovascular: negative for chest pain, dyspnea on exertion, edema, orthopnea, palpitations, paroxysmal nocturnal dyspnea or shortness of breath Dermatological: negative for rash Respiratory: negative for cough or wheezing Urologic: negative for hematuria Abdominal: negative for nausea, vomiting, diarrhea, bright red blood per rectum, melena, or hematemesis Neurologic: negative for visual changes, syncope, or dizziness All other systems reviewed and are otherwise negative except as noted above.    Blood pressure 106/76, pulse 86, height 5\' 6"  (1.676 m), weight 153 lb (69.4 kg), SpO2 98 %.  General appearance: alert and no distress Neck: no adenopathy, no carotid bruit, no JVD, supple, symmetrical, trachea midline, thyroid not enlarged, symmetric, no tenderness/mass/nodules and Bilateral bruits versus transmitted murmur Lungs: clear to auscultation bilaterally Heart: 2/6 systolic ejection murmur  at murmur at the base consistent with aortic stenosis. Extremities: Tumor 6 systolic ejection murmur at the base consistent with aortic stenosis.  EKG not performed today  ASSESSMENT AND PLAN:   Dyspnea on exertion Charlene Carey returns today for follow-up for 2-D echo performed in evaluation of dyspnea and chest pain. Her LV function was normal. Aortic stenosis has progressed now to the moderate range with a peak  gradient of 54 mmHg and a mean of 35. Valve area was 0.89 cm. Based on this and her symptoms I decided to proceed with outpatient right and left heart cath. The patient understands that risks included but are not limited to stroke (1 in 1000), death (1 in 62), kidney failure [usually temporary] (1 in 500), bleeding (1 in 200), allergic reaction [possibly serious] (1 in 200). The patient understands and agrees to proceed      Lorretta Harp MD [facility], [facility] 11/08/2016 10:30 AM

## 2016-11-12 ENCOUNTER — Telehealth: Payer: Self-pay

## 2016-11-12 NOTE — Telephone Encounter (Signed)
Pre visit call completed 

## 2016-11-13 ENCOUNTER — Ambulatory Visit
Admission: RE | Admit: 2016-11-13 | Discharge: 2016-11-13 | Disposition: A | Discharge status: Home / Self Care | Payer: BLUE CROSS/BLUE SHIELD | Source: Ambulatory Visit | Attending: Cardiovascular Disease | Admitting: Cardiovascular Disease

## 2016-11-13 ENCOUNTER — Ambulatory Visit (INDEPENDENT_AMBULATORY_CARE_PROVIDER_SITE_OTHER): Payer: BLUE CROSS/BLUE SHIELD | Admitting: Family

## 2016-11-13 ENCOUNTER — Other Ambulatory Visit: Payer: Self-pay | Admitting: Cardiovascular Disease

## 2016-11-13 ENCOUNTER — Encounter: Payer: Self-pay | Admitting: Family

## 2016-11-13 VITALS — BP 121/75 | HR 77 | Temp 98.2°F | Resp 16 | Ht 67.0 in | Wt 154.2 lb

## 2016-11-13 DIAGNOSIS — E559 Vitamin D deficiency, unspecified: Secondary | ICD-10-CM

## 2016-11-13 DIAGNOSIS — I350 Nonrheumatic aortic (valve) stenosis: Secondary | ICD-10-CM | POA: Diagnosis not present

## 2016-11-13 DIAGNOSIS — F419 Anxiety disorder, unspecified: Secondary | ICD-10-CM

## 2016-11-13 DIAGNOSIS — E041 Nontoxic single thyroid nodule: Secondary | ICD-10-CM

## 2016-11-13 DIAGNOSIS — E0590 Thyrotoxicosis, unspecified without thyrotoxic crisis or storm: Secondary | ICD-10-CM

## 2016-11-13 DIAGNOSIS — R0609 Other forms of dyspnea: Principal | ICD-10-CM

## 2016-11-13 LAB — T4, FREE: FREE T4: 1.17 ng/dL (ref 0.60–1.60)

## 2016-11-13 LAB — T3, FREE: T3, Free: 3.1 pg/mL (ref 2.3–4.2)

## 2016-11-13 LAB — TSH: TSH: 0.38 u[IU]/mL (ref 0.35–4.50)

## 2016-11-13 IMAGING — CR DG CHEST 2V
2 series · 2 of 2 positions shown · non-contrast
Comparison: [DATE]

CLINICAL DATA: Dyspnea on exertion.  Aortic valve stenosis.

EXAM:
CHEST  2 VIEW

[w chest pa]
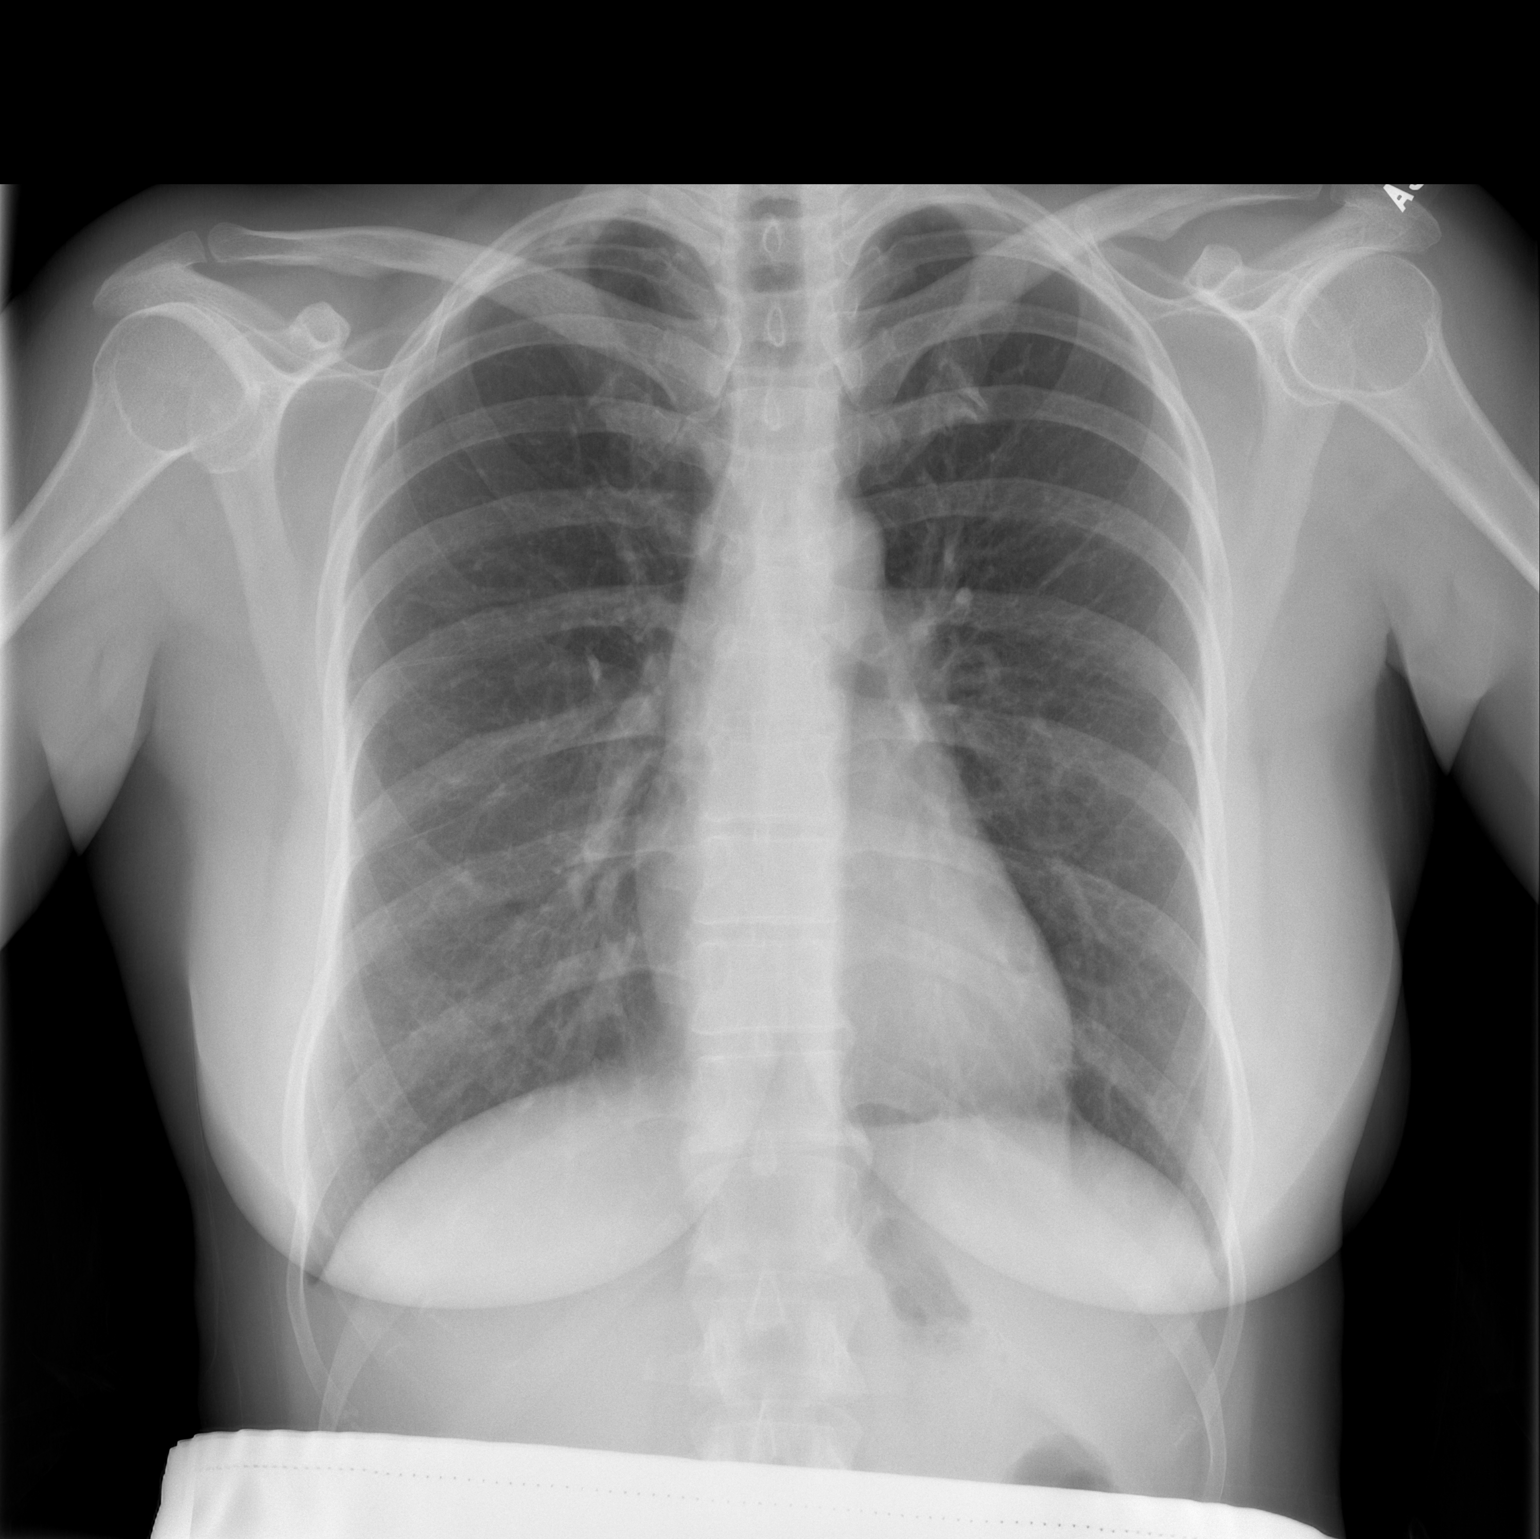

[w chest lat]
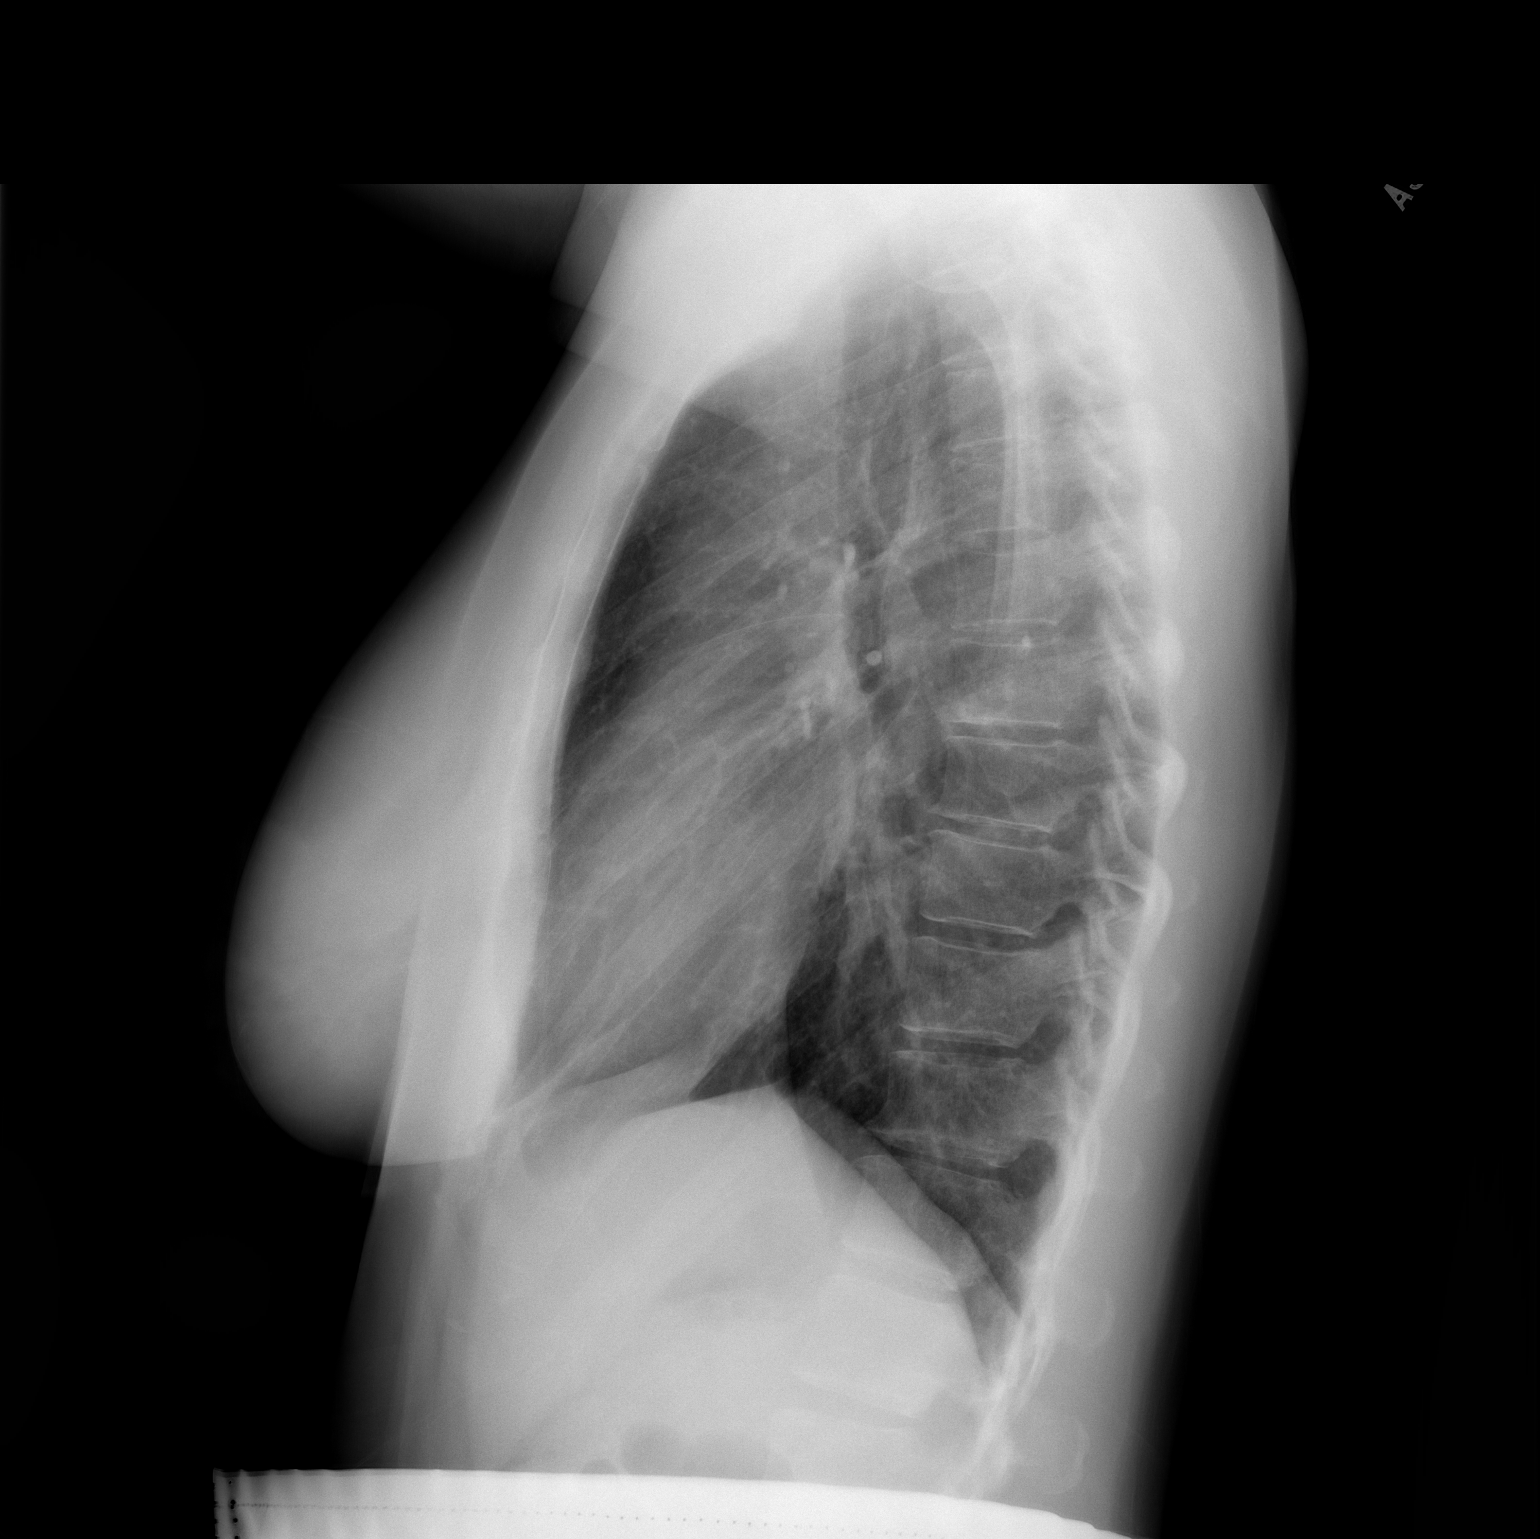

[2 of 2 positions shown; findings below may reference images not displayed]

FINDINGS: Normal heart size and mediastinal contours. No acute infiltrate or
edema. No effusion or pneumothorax. No acute osseous findings.
IMPRESSION: Negative chest.
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
## 2016-11-13 NOTE — Patient Instructions (Addendum)
Please complete lab work prior to leaving.  Welcome to [facility]! 

## 2016-11-14 ENCOUNTER — Ambulatory Visit (HOSPITAL_BASED_OUTPATIENT_CLINIC_OR_DEPARTMENT_OTHER)
Admission: RE | Admit: 2016-11-14 | Discharge: 2016-11-14 | Disposition: A | Discharge status: Home / Self Care | Payer: BLUE CROSS/BLUE SHIELD | Source: Ambulatory Visit | Attending: Family | Admitting: Family

## 2016-11-14 DIAGNOSIS — E041 Nontoxic single thyroid nodule: Secondary | ICD-10-CM | POA: Diagnosis present

## 2016-11-14 DIAGNOSIS — E042 Nontoxic multinodular goiter: Secondary | ICD-10-CM | POA: Diagnosis not present

## 2016-11-15 ENCOUNTER — Telehealth: Payer: Self-pay

## 2016-11-15 NOTE — Telephone Encounter (Signed)
Call placed to Pt.  Call went to VM.  Left generic message requesting call back.  This nurse name and # left.  Await call back.

## 2016-11-16 ENCOUNTER — Telehealth: Payer: Self-pay | Admitting: Family

## 2016-11-16 DIAGNOSIS — E042 Nontoxic multinodular goiter: Secondary | ICD-10-CM

## 2016-11-16 NOTE — Telephone Encounter (Addendum)
Her thyroid blood test is now in the normal range but still bordering on overactive thyroid. Vit D testing is normal.

## 2016-11-16 NOTE — Telephone Encounter (Signed)
Please let pt know that her US shows multiple thyroid nodules. I would like her to see endocrinology for further evaluation.

## 2016-11-17 LAB — VITAMIN D 1,25 DIHYDROXY
VITAMIN D 1, 25 (OH) TOTAL: 63 pg/mL (ref 18–72)
VITAMIN D3 1, 25 (OH): 63 pg/mL
Vitamin D2 1, 25 (OH)2: <8 pg/mL

## 2016-11-18 ENCOUNTER — Encounter (HOSPITAL_COMMUNITY)
Admission: RE | Disposition: A | Discharge status: Home / Self Care | Payer: Self-pay | Source: Ambulatory Visit | Attending: Cardiovascular Disease

## 2016-11-18 ENCOUNTER — Encounter: Payer: Self-pay | Admitting: Family

## 2016-11-18 ENCOUNTER — Ambulatory Visit (HOSPITAL_COMMUNITY)
Admission: RE | Admit: 2016-11-18 | Discharge: 2016-11-18 | Disposition: A | Discharge status: Home / Self Care | Payer: BLUE CROSS/BLUE SHIELD | Source: Ambulatory Visit | Attending: Cardiovascular Disease | Admitting: Cardiovascular Disease

## 2016-11-18 DIAGNOSIS — I350 Nonrheumatic aortic (valve) stenosis: Secondary | ICD-10-CM

## 2016-11-18 DIAGNOSIS — E663 Overweight: Secondary | ICD-10-CM | POA: Diagnosis not present

## 2016-11-18 DIAGNOSIS — Q230 Congenital stenosis of aortic valve: Secondary | ICD-10-CM

## 2016-11-18 DIAGNOSIS — R0609 Other forms of dyspnea: Secondary | ICD-10-CM | POA: Insufficient documentation

## 2016-11-18 DIAGNOSIS — Z6824 Body mass index (BMI) 24.0-24.9, adult: Secondary | ICD-10-CM | POA: Insufficient documentation

## 2016-11-18 DIAGNOSIS — K219 Gastro-esophageal reflux disease without esophagitis: Secondary | ICD-10-CM | POA: Diagnosis not present

## 2016-11-18 DIAGNOSIS — Z87891 Personal history of nicotine dependence: Secondary | ICD-10-CM | POA: Insufficient documentation

## 2016-11-18 DIAGNOSIS — Q231 Congenital insufficiency of aortic valve: Secondary | ICD-10-CM

## 2016-11-18 DIAGNOSIS — Q2381 Bicuspid aortic valve: Secondary | ICD-10-CM

## 2016-11-18 HISTORY — PX: RIGHT/LEFT HEART CATH AND CORONARY ANGIOGRAPHY: CATH118266

## 2016-11-18 LAB — POCT I-STAT 3, VENOUS BLOOD GAS (G3P V)
Acid-base deficit: 1 mmol/L (ref 0.0–2.0)
BICARBONATE: 23.9 mmol/L (ref 20.0–28.0)
O2 Saturation: 77 %
PCO2 VEN: 39.5 mmHg — AB (ref 44.0–60.0)
PH VEN: 7.389 (ref 7.250–7.430)
PO2 VEN: 42 mmHg (ref 32.0–45.0)
TCO2: 25 mmol/L (ref 0–100)

## 2016-11-18 LAB — POCT I-STAT 3, ART BLOOD GAS (G3+)
Acid-base deficit: 1 mmol/L (ref 0.0–2.0)
Bicarbonate: 23.9 mmol/L (ref 20.0–28.0)
O2 Saturation: 97 %
PCO2 ART: 39 mmHg (ref 32.0–48.0)
PH ART: 7.396 (ref 7.350–7.450)
PO2 ART: 89 mmHg (ref 83.0–108.0)
TCO2: 25 mmol/L (ref 0–100)

## 2016-11-18 LAB — PREGNANCY, URINE: PREG TEST UR: NEGATIVE

## 2016-11-18 SURGERY — RIGHT/LEFT HEART CATH AND CORONARY ANGIOGRAPHY
Anesthesia: LOCAL

## 2016-11-18 MED ORDER — ASPIRIN 81 MG PO CHEW
CHEWABLE_TABLET | ORAL | Status: AC
  Administered 2016-11-18: 81 mg via ORAL
  Filled 2016-11-18: qty 1

## 2016-11-18 MED ORDER — MIDAZOLAM HCL 2 MG/2ML IJ SOLN
INTRAMUSCULAR | Status: AC
  Filled 2016-11-18: qty 2

## 2016-11-18 MED ORDER — FENTANYL CITRATE (PF) 100 MCG/2ML IJ SOLN
INTRAMUSCULAR | Status: AC
  Filled 2016-11-18: qty 2

## 2016-11-18 MED ORDER — SODIUM CHLORIDE 0.9% FLUSH: 3.0000 mL | Freq: Two times a day (BID) | INTRAVENOUS | Status: DC

## 2016-11-18 MED ORDER — LIDOCAINE HCL (PF) 1 % IJ SOLN
INTRAMUSCULAR | Status: DC | PRN
  Administered 2016-11-18: 24 mL

## 2016-11-18 MED ORDER — HEPARIN (PORCINE) IN NACL 2-0.9 UNIT/ML-% IJ SOLN
INTRAMUSCULAR | Status: AC | PRN
  Administered 2016-11-18: 1000 mL

## 2016-11-18 MED ORDER — HEPARIN (PORCINE) IN NACL 2-0.9 UNIT/ML-% IJ SOLN
INTRAMUSCULAR | Status: AC
  Filled 2016-11-18: qty 1000

## 2016-11-18 MED ORDER — IOPAMIDOL (ISOVUE-370) INJECTION 76%
INTRAVENOUS | Status: DC | PRN
  Administered 2016-11-18: 65 mL via INTRA_ARTERIAL

## 2016-11-18 MED ORDER — ASPIRIN 81 MG PO CHEW
81.0000 mg | CHEWABLE_TABLET | Freq: Every day | ORAL | Status: DC
  Administered 2016-11-18: 81 mg via ORAL

## 2016-11-18 MED ORDER — LIDOCAINE HCL (PF) 1 % IJ SOLN
INTRAMUSCULAR | Status: AC
  Filled 2016-11-18: qty 30

## 2016-11-18 MED ORDER — SODIUM CHLORIDE 0.9 % IV SOLN
250.0000 mL | INTRAVENOUS | Status: DC | PRN
Start: 2016-11-18 — End: 2016-11-18

## 2016-11-18 MED ORDER — SODIUM CHLORIDE 0.9% FLUSH
3.0000 mL | INTRAVENOUS | Status: DC | PRN
Start: 2016-11-18 — End: 2016-11-18

## 2016-11-18 MED ORDER — ASPIRIN 81 MG PO CHEW
CHEWABLE_TABLET | ORAL | Status: AC
  Filled 2016-11-18: qty 1

## 2016-11-18 MED ORDER — SODIUM CHLORIDE 0.9 % IV SOLN
INTRAVENOUS | Status: AC
  Administered 2016-11-18: 16:00:00 via INTRAVENOUS

## 2016-11-18 MED ORDER — SODIUM CHLORIDE 0.9 % WEIGHT BASED INFUSION: 1.0000 mL/kg/h | INTRAVENOUS | Status: DC

## 2016-11-18 MED ORDER — SODIUM CHLORIDE 0.9% FLUSH: 3.0000 mL | INTRAVENOUS | Status: DC | PRN

## 2016-11-18 MED ORDER — MIDAZOLAM HCL 2 MG/2ML IJ SOLN
INTRAMUSCULAR | Status: DC | PRN
  Administered 2016-11-18: 1 mg via INTRAVENOUS

## 2016-11-18 MED ORDER — ACETAMINOPHEN 325 MG PO TABS: 650.0000 mg | ORAL_TABLET | ORAL | Status: DC | PRN

## 2016-11-18 MED ORDER — FENTANYL CITRATE (PF) 100 MCG/2ML IJ SOLN
INTRAMUSCULAR | Status: DC | PRN
  Administered 2016-11-18: 25 ug via INTRAVENOUS

## 2016-11-18 MED ORDER — SODIUM CHLORIDE 0.9 % WEIGHT BASED INFUSION
3.0000 mL/kg/h | INTRAVENOUS | Status: AC
  Administered 2016-11-18: 3 mL/kg/h via INTRAVENOUS

## 2016-11-18 MED ORDER — ASPIRIN 81 MG PO CHEW
81.0000 mg | CHEWABLE_TABLET | ORAL | Status: AC
  Administered 2016-11-18: 81 mg via ORAL

## 2016-11-18 MED ORDER — IOPAMIDOL (ISOVUE-370) INJECTION 76%
INTRAVENOUS | Status: AC
  Filled 2016-11-18: qty 100

## 2016-11-18 MED ORDER — ONDANSETRON HCL 4 MG/2ML IJ SOLN: 4.0000 mg | Freq: Four times a day (QID) | INTRAMUSCULAR | Status: DC | PRN

## 2016-11-18 SURGICAL SUPPLY — 15 items
CATH INFINITI 5FR MULTPACK ANG (CATHETERS) ×1 IMPLANT
CATH SWAN GANZ 7F STRAIGHT (CATHETERS) ×1 IMPLANT
DEVICE CLOSURE MYNXGRIP 6/7F (Vascular Products) ×1 IMPLANT
KIT HEART LEFT (KITS) ×2 IMPLANT
PACK CARDIAC CATHETERIZATION (CUSTOM PROCEDURE TRAY) ×2 IMPLANT
SHEATH PINNACLE 5F 10CM (SHEATH) IMPLANT
SHEATH PINNACLE 6F 10CM (SHEATH) ×1 IMPLANT
SHEATH PINNACLE 7F 10CM (SHEATH) ×1 IMPLANT
SYR MEDRAD MARK V 150ML (SYRINGE) ×2 IMPLANT
TRANSDUCER W/STOPCOCK (MISCELLANEOUS) ×3 IMPLANT
TUBING ART PRESS 72  MALE/FEM (TUBING) ×1
TUBING ART PRESS 72 MALE/FEM (TUBING) IMPLANT
TUBING CIL FLEX 10 FLL-RA (TUBING) ×2 IMPLANT
WIRE EMERALD 3MM-J .035X150CM (WIRE) ×1 IMPLANT
WIRE EMERALD ST .035X150CM (WIRE) ×1 IMPLANT

## 2016-11-18 NOTE — Discharge Instructions (Signed)

## 2016-11-18 NOTE — H&P (View-Only) (Signed)
11/08/2016 Charlene Carey   [birth year]  [id number]  Primary Physician Patient, No Pcp Per Primary Cardiologist: Lorretta Harp MD Renae Gloss  HPI:  Charlene Carey is a very pleasant 41 year old mildly overweight married Caucasian female mother of 3 children and is accompanied by Charlene Carey today. She saw Dr. Martinique in the remote past (2011). I last saw Charlene in the office 02/14/15. There is a history of a murmur from childhood with what she describes as a bicuspid aortic valve. Dr. Martinique said that she would need this addressed at some point in the future. She has no other cardiovascular risk factors. She has noticed increasing dyspnea on exertion when walking up stairs and fatigue with some chest heaviness. She has had no problems with Charlene deliveries. There is no family history of bicuspid aortic valve sclerosis. Since I saw Charlene last she's gained approximately 15 pounds. She does complain of increasing dyspnea on exertion and some chest burning which she attributes to repeat reflux. A 2-D echocardiogram performed 10/29/16 revealed normal LV function with progression of Charlene aortic stenosis now in the moderate range. Gradient was 54 mmHg with a mean of 35 and a valve area 0.89 cm. She recently was at [facility] and  when trying to keep up with Charlene children and husband noticed increasing dyspnea and chest. Burning rating to Charlene back. Based on this we decided to proceed with outpatient right and left heart cath to define Charlene anatomy and physiology.   Current Outpatient Prescriptions  Medication Sig Dispense Refill  . cetirizine (ZYRTEC) 10 MG tablet Take 10 mg by mouth daily.    . pantoprazole (PROTONIX) 40 MG tablet Take 1 tablet (40 mg total) by mouth daily. 30 tablet 11   No current facility-administered medications for this visit.     Allergies  Allergen Reactions  . Codeine Nausea Only    Has withdrawal when trying to stop med    Social History   Social  History  . Marital status: Married    Spouse name: N/A  . Number of children: N/A  . Years of education: N/A   Occupational History  . Not on file.   Social History Main Topics  . Smoking status: Former Research scientist (life sciences)  . Smokeless tobacco: Never Used  . Alcohol use 0.6 oz/week    1 Standard drinks or equivalent per week     Comment: depends on day   . Drug use: No  . Sexual activity: Not on file   Other Topics Concern  . Not on file   Social History Narrative  . No narrative on file     Review of Systems: General: negative for chills, fever, night sweats or weight changes.  Cardiovascular: negative for chest pain, dyspnea on exertion, edema, orthopnea, palpitations, paroxysmal nocturnal dyspnea or shortness of breath Dermatological: negative for rash Respiratory: negative for cough or wheezing Urologic: negative for hematuria Abdominal: negative for nausea, vomiting, diarrhea, bright red blood per rectum, melena, or hematemesis Neurologic: negative for visual changes, syncope, or dizziness All other systems reviewed and are otherwise negative except as noted above.    Blood pressure 106/76, pulse 86, height 5\' 6"  (1.676 m), weight 153 lb (69.4 kg), SpO2 98 %.  General appearance: alert and no distress Neck: no adenopathy, no carotid bruit, no JVD, supple, symmetrical, trachea midline, thyroid not enlarged, symmetric, no tenderness/mass/nodules and Bilateral bruits versus transmitted murmur Lungs: clear to auscultation bilaterally Heart: 2/6 systolic ejection murmur  at murmur at the base consistent with aortic stenosis. Extremities: Tumor 6 systolic ejection murmur at the base consistent with aortic stenosis.  EKG not performed today  ASSESSMENT AND PLAN:   Dyspnea on exertion Charlene Carey returns today for follow-up for 2-D echo performed in evaluation of dyspnea and chest pain. Charlene LV function was normal. Aortic stenosis has progressed now to the moderate range with a peak  gradient of 54 mmHg and a mean of 35. Valve area was 0.89 cm. Based on this and Charlene symptoms I decided to proceed with outpatient right and left heart cath. The patient understands that risks included but are not limited to stroke (1 in 1000), death (1 in 67), kidney failure [usually temporary] (1 in 500), bleeding (1 in 200), allergic reaction [possibly serious] (1 in 200). The patient understands and agrees to proceed      Lorretta Harp MD [facility], [facility] 11/08/2016 10:30 AM

## 2016-11-18 NOTE — Interval H&P Note (Signed)
Cath Lab Visit (complete for each Cath Lab visit)  Clinical Evaluation Leading to the Procedure:   ACS: No.  Non-ACS:    Anginal Classification: CCS II  Anti-ischemic medical therapy: No Therapy  Non-Invasive Test Results: No non-invasive testing performed  Prior CABG: No previous CABG      History and Physical Interval Note:  11/18/2016 3:02 PM  Charlene Carey  has presented today for surgery, with the diagnosis of aortic stenosis  The various methods of treatment have been discussed with the patient and family. After consideration of risks, benefits and other options for treatment, the patient has consented to  Procedure(s): Right/Left Heart Cath and Coronary Angiography (N/A) as a surgical intervention .  The patient's history has been reviewed, patient examined, no change in status, stable for surgery.  I have reviewed the patient's chart and labs.  Questions were answered to the patient's satisfaction.     Quay Burow

## 2016-11-18 NOTE — Telephone Encounter (Signed)
Notified pt and she is agreeable to proceed with referral. Advised pt to let us know if she has not been contacted within 1 week regarding endo appt.

## 2016-11-19 ENCOUNTER — Encounter (HOSPITAL_COMMUNITY): Payer: Self-pay | Admitting: Cardiovascular Disease

## 2016-11-20 ENCOUNTER — Telehealth: Payer: Self-pay | Admitting: Cardiovascular Disease

## 2016-11-20 NOTE — Telephone Encounter (Signed)
Pt said she had a Cath on Monday. She was told by the nurse to call and find out is she needs to take an aspirin every day?

## 2016-11-20 NOTE — Telephone Encounter (Signed)
Patient had cath on Monday 6/11 by Dr. Gwenlyn Found to evaluate aortic stenosis. Will defer to MD to advise on ASA

## 2016-11-24 NOTE — Telephone Encounter (Signed)
No need for her to be on aspirin.

## 2016-11-25 NOTE — Telephone Encounter (Signed)
Patient returned the phone call. Informed that per Dr. Gwenlyn Found she did not need to take an aspirin. Her appointment for Monday the 25th with Rosaria Ferries, PA has been confirmed. Patient verbalized her understanding.

## 2016-11-25 NOTE — Telephone Encounter (Signed)
Left message to call back  

## 2016-12-02 ENCOUNTER — Ambulatory Visit (INDEPENDENT_AMBULATORY_CARE_PROVIDER_SITE_OTHER): Payer: BLUE CROSS/BLUE SHIELD | Admitting: Physician Assistant

## 2016-12-02 ENCOUNTER — Encounter: Payer: Self-pay | Admitting: Physician Assistant

## 2016-12-02 VITALS — Ht 66.5 in | Wt 155.0 lb

## 2016-12-02 DIAGNOSIS — I7789 Other specified disorders of arteries and arterioles: Secondary | ICD-10-CM

## 2016-12-02 DIAGNOSIS — Q231 Congenital insufficiency of aortic valve: Secondary | ICD-10-CM

## 2016-12-02 DIAGNOSIS — R0609 Other forms of dyspnea: Secondary | ICD-10-CM

## 2016-12-02 DIAGNOSIS — Q230 Congenital stenosis of aortic valve: Secondary | ICD-10-CM | POA: Diagnosis not present

## 2016-12-02 DIAGNOSIS — R0789 Other chest pain: Secondary | ICD-10-CM

## 2016-12-02 LAB — HCG, SERUM, QUALITATIVE: hCG,Beta Subunit,Qual,Serum: NEGATIVE m[IU]/mL (ref <6)

## 2016-12-02 NOTE — Patient Instructions (Addendum)
Medication Instructions:  Your physician recommends that you continue on your current medications as directed. Please refer to the Current Medication list given to you today.  Labwork: D-DIMER  Testing/Procedures: CTA CHEST AORTA   Follow-Up: Your physician recommends that you schedule a follow-up appointment in: DR BERRY July   If you need a refill on your cardiac medications before your next appointment, please call your pharmacy.

## 2016-12-02 NOTE — Progress Notes (Signed)
Cardiology Office Note   Date:  12/02/2016   ID:  Charlene Carey, DOB 1975-09-14, MRN [id number]  PCP:  Debbrah Alar, NP  Cardiologist:  Dr. Gwenlyn Found 11/08/2016 Charlene Ferries, PA-C    History of Present Illness: Charlene Carey is a 41 y.o. female with a history of moderate AS  06/10, seen by Dr. Gwenlyn Found for dyspnea on exertion and chest burning, cath scheduled 06/11, R/L heart cath with no significant CAD and mild AS, needs CT angiogram for her aortic root.  Charlene Carey presents for cardiology follow up.  She remembers being told that her arteries were clear and her valve was mild. She also remembers being told that her aorta is enlarged.  She is getting chest tightness with exertion. She will get the tightness with climbing one flight of stairs and will get a cramping pain in her back that comes around to her chest. It resolves with rest.  She tried to a gentle hike recently, just could not keep up with others. She tried to walk up the stairs carrying laundry, but this also made her very SOB and gave her chest pain.   She denies orthopnea, PND or LE edema. No CP at rest.   She is frustrated because she feels that her activity level is very limited. She is not able to play with her son and she would like. She doesn't see away to improve.  Before her bowel started giving her problems, she was an exerciser and was very active.   Past Medical History:  Diagnosis Date  . Allergy-induced asthma   . Aortic stenosis with bicuspid valve   . Frequent headaches   . Heartburn   . History of chicken pox   . History of gestational diabetes   . Seasonal allergies     Past Surgical History:  Procedure Laterality Date  . BREAST REDUCTION SURGERY    . OTHER SURGICAL HISTORY     had d& c  . RIGHT/LEFT HEART CATH AND CORONARY ANGIOGRAPHY N/A 11/18/2016   Procedure: Right/Left Heart Cath and Coronary Angiography;  Surgeon: Lorretta Harp, MD;  Location: [facility];  Service: Cardiovascular;  Laterality: N/A;    No current outpatient prescriptions on file.   No current facility-administered medications for this visit.     Allergies:   Codeine    Social History:  The patient  reports that she has quit smoking. She has never used smokeless tobacco. She reports that she drinks about 0.6 oz of alcohol per week . She reports that she does not use drugs.   Family History:  The patient's family history includes Diabetes in her father; Hyperlipidemia in her maternal grandfather; Hypertension in her maternal grandfather and mother; Stroke in her maternal grandfather.    ROS:  Please see the history of present illness. All other systems are reviewed and negative.    PHYSICAL EXAM: VS:  Ht 5' 6.5" (1.689 m)   Wt 155 lb (70.3 kg)   BMI 24.64 kg/m  , BMI Body mass index is 24.64 kg/m. GEN: Well nourished, well developed, female in no acute distress  HEENT: normal for age  Neck: no JVD, no carotid bruit, no masses Cardiac: RRR; 3/6 murmur, no rubs, or gallops, loud S2 Respiratory:  clear to auscultation bilaterally, normal work of breathing GI: soft, nontender, nondistended, + BS MS: no deformity or atrophy; no edema; distal pulses are 2+ in all 4 extremities   Skin: warm and dry, no  rash Neuro:  Strength and sensation are intact Psych: euthymic mood, full affect   EKG:  EKG is not ordered today.  CATH: 11/18/2016 IMPRESSION: [name] has normal coronary arteries, mild aortic stenosis and a dilated aortic root. She'll need CT angiography of her aorta. At this point, I do not think that her aortic valve is tight enough to require intervention. I performed right common femoral angiogram and MYNX closed right common femoral puncture site. She will be discharged home today as an outpatient and I will see her back in 2-3 weeks for follow-up.  ECHO: 10/29/2016 - Left ventricle: The cavity size was normal. Systolic function was    normal. The estimated ejection fraction was in the range of 55%   to 60%. Wall motion was normal; there were no regional wall   motion abnormalities. Left ventricular diastolic function   parameters were normal. - Aortic valve: There was moderate stenosis. There was no   regurgitation. Peak velocity (S): 380 cm/s. Mean gradient (S): 35 mm Hg. - Mitral valve: Transvalvular velocity was within the normal range.   There was no evidence for stenosis. There was trivial regurgitation. - Right ventricle: The cavity size was normal. Wall thickness was   normal. Systolic function was normal. - Atrial septum: No defect or patent foramen ovale was identified   by color flow Doppler. - Tricuspid valve: There was trivial regurgitation. - Pulmonary arteries: Systolic pressure was within the normal   range. PA peak pressure: 40 mm Hg (S). Impressions: - Compared with the echo 01/2015, the mean aortic valve gradient has   increased from 24 mmHg to 35 mmHg.  Recent Labs: 10/16/2016: ALT 12 11/08/2016: BUN 11; Creatinine, Ser 0.85; Hemoglobin 14.7; Platelets 233; Potassium 4.8; Sodium 139 11/13/2016: TSH 0.38    Lipid Panel    Component Value Date/Time   CHOL 161 10/16/2016 1058   TRIG 80 10/16/2016 1058   HDL 49 (L) 10/16/2016 1058   CHOLHDL 3.3 10/16/2016 1058   VLDL 16 10/16/2016 1058   LDLCALC 96 10/16/2016 1058     Wt Readings from Last 3 Encounters:  12/02/16 155 lb (70.3 kg)  11/18/16 154 lb (69.9 kg)  11/13/16 154 lb 3.2 oz (69.9 kg)     Other studies Reviewed: Additional studies/ records that were reviewed today include: Office notes, hospital records and testing.  ASSESSMENT AND PLAN:  1.  Bicuspid aortic valve with aortic stenosis: I feel that her dyspnea on exertion and chest pain are coming from supply/demand mismatch secondary to her valve. I will discuss with Dr. There are any procedures available might help her. Her gradients are not high enough for a valve replacement. She is  encouraged to limit her activity but do anything she feels like doing.  2. Enlarged aortic root: See cath report. A CT chest to evaluate this was ordered. Prior to the CT, we will check a BMET and a pregnancy test if these are needed. Follow-up on results  3. Chest pain/DOE: Her symptoms are atypical and she had no coronary artery disease cath. However, the patient and her family are very worried so we will check a d-dimer.   Current medicines are reviewed at length with the patient today.  The patient does not have concerns regarding medicines.  The following changes have been made:  no change  Labs/ tests ordered today include:   Orders Placed This Encounter  Procedures  . CT ANGIO CHEST AORTA W &/OR WO CONTRAST  . D-Dimer, Quantitative  .  hCG, serum, qualitative     Disposition:   FU with Dr. Gwenlyn Found  Signed, Thayne Cindric, Suanne Marker, PA-C  12/02/2016 1:37 PM    [facility] Phone: [phone]; Fax: [phone]  This note was written with the assistance of speech recognition software. Please excuse any transcriptional errors.

## 2016-12-03 LAB — D-DIMER, QUANTITATIVE: D-DIMER: 0.26 mg/L FEU (ref 0.00–0.49)

## 2016-12-05 ENCOUNTER — Ambulatory Visit (INDEPENDENT_AMBULATORY_CARE_PROVIDER_SITE_OTHER)
Admission: RE | Admit: 2016-12-05 | Discharge: 2016-12-05 | Disposition: A | Discharge status: Home / Self Care | Payer: BLUE CROSS/BLUE SHIELD | Source: Ambulatory Visit | Attending: Physician Assistant | Admitting: Physician Assistant

## 2016-12-05 DIAGNOSIS — I7789 Other specified disorders of arteries and arterioles: Secondary | ICD-10-CM | POA: Diagnosis not present

## 2016-12-05 IMAGING — CT CT ANGIO CHEST
2 of 7 series · 18 of 46 positions shown · IV contrast (isovue)
Comparison: None.

CLINICAL DATA: Eval aortic root enlargement. Hx of bicuspid valve.
Chronic SOB with only slight exertion worsening since [REDACTED].

EXAM:
CT ANGIOGRAPHY CHEST WITH CONTRAST
TECHNIQUE: Multidetector CT imaging of the chest was performed using the
standard protocol during bolus administration of intravenous
contrast. Multiplanar CT image reconstructions and MIPs were
obtained to evaluate the vascular anatomy.
CONTRAST:  100 cc Isovue 370

[Series 4: aorta 3.0 i31f 2 · axial · 0.65mm/px · z∈[-303,-21]mm · 15 of 102 slices shown]
[im 4/102  lung]
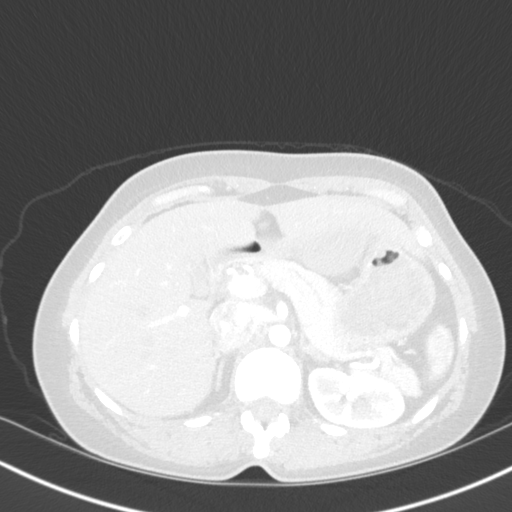
[im 11/102  soft-tissue]
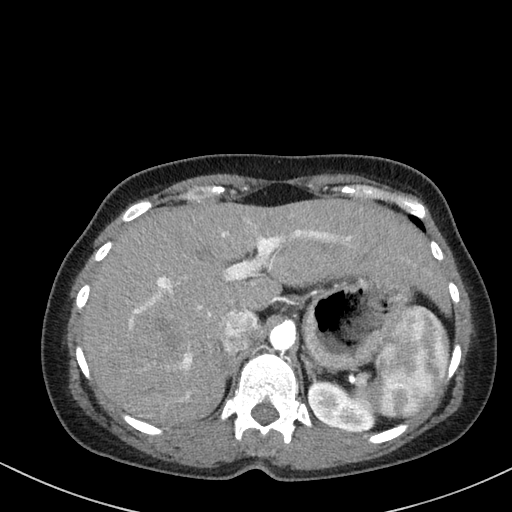
[im 18/102  lung]
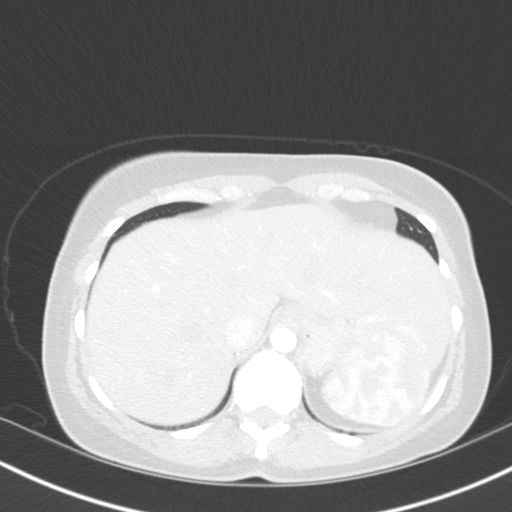
[im 25/102  soft-tissue]
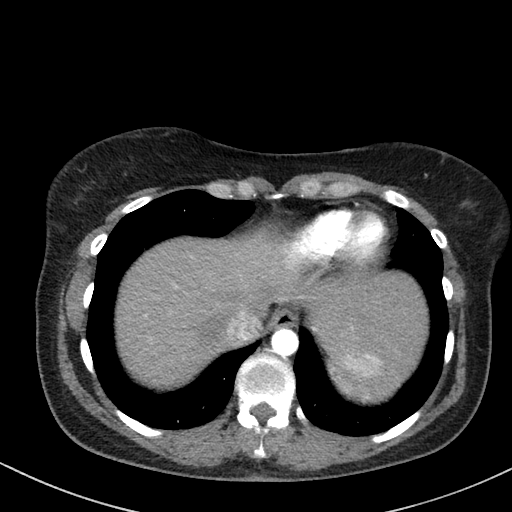
[im 32/102  lung]
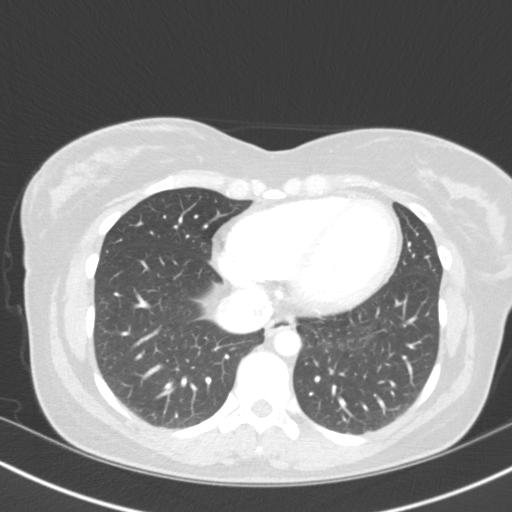
[im 39/102  soft-tissue]
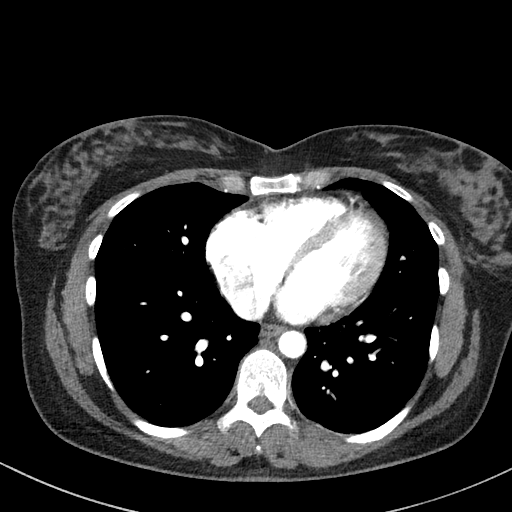
[im 46/102  lung]
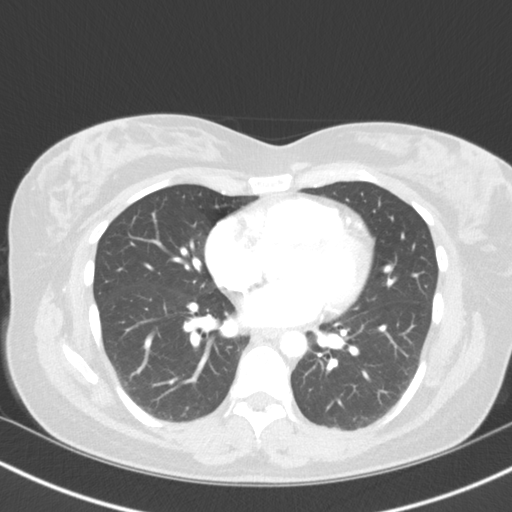
[im 53/102  soft-tissue]
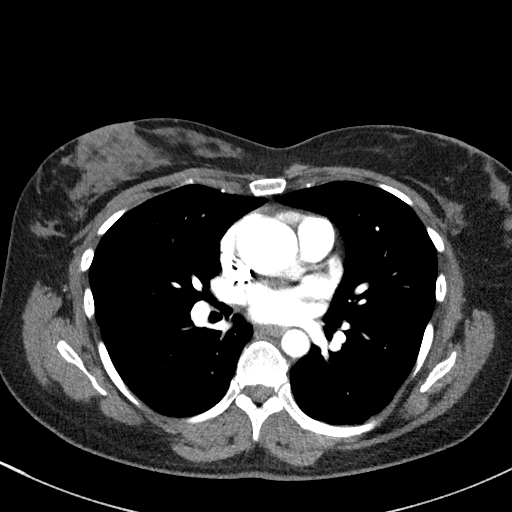
[im 56/102  lung]
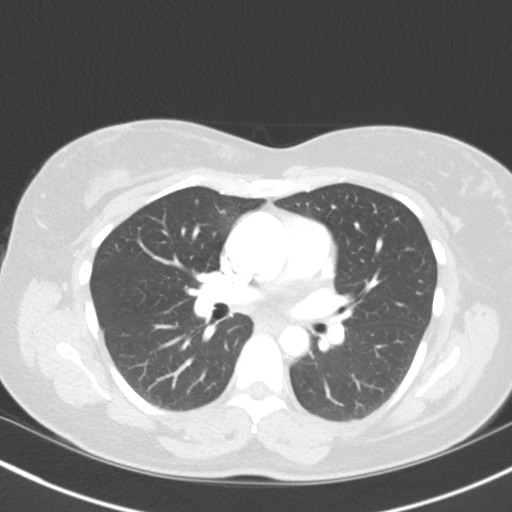
[im 63/102  soft-tissue]
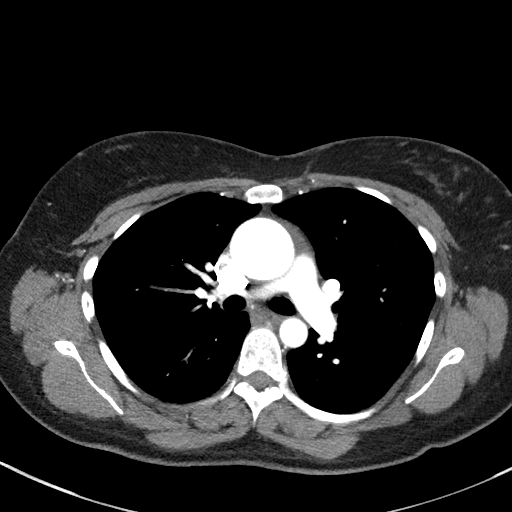
[im 70/102  lung]
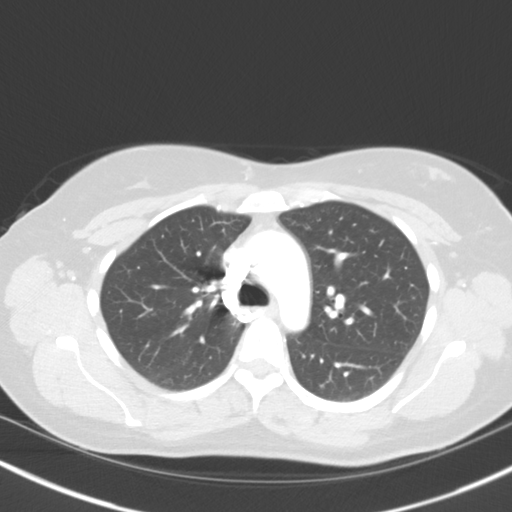
[im 77/102  soft-tissue]
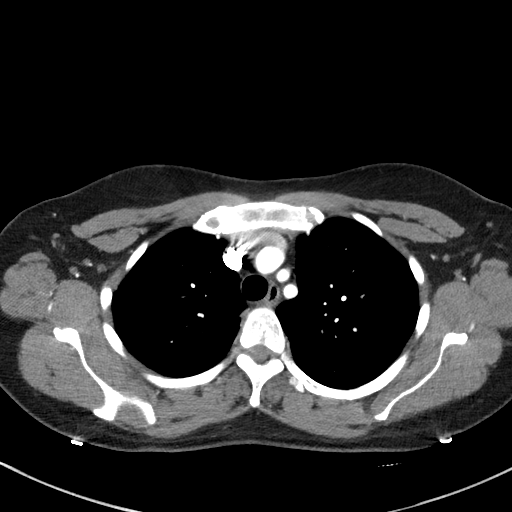
[im 84/102  lung]
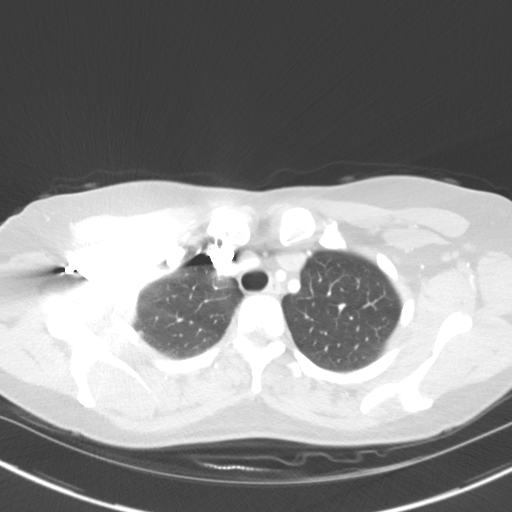
[im 91/102  soft-tissue]
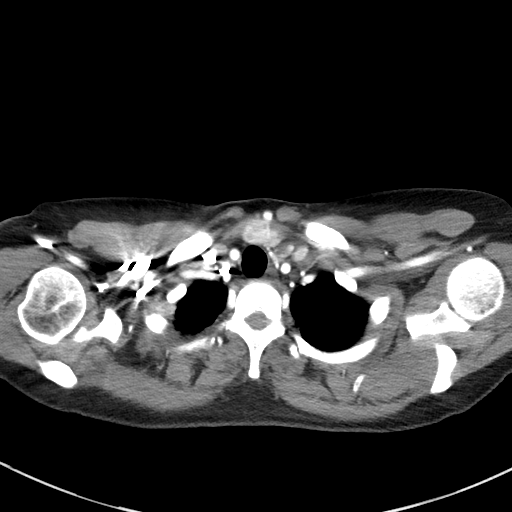
[im 98/102  lung]
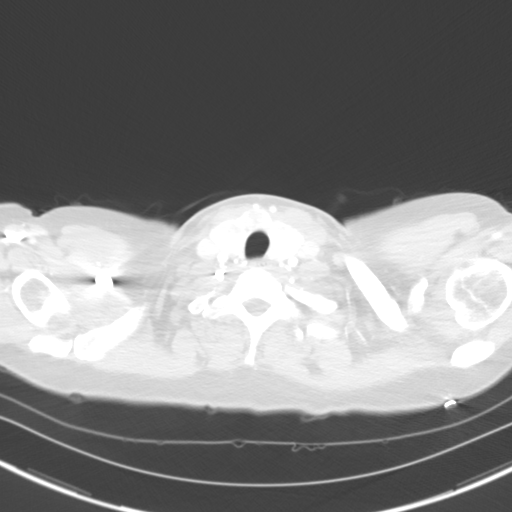

[Series 7: coronals · coronal · 0.59mm/px · 3 of 98 slices shown]
[im 25/98  soft-tissue]
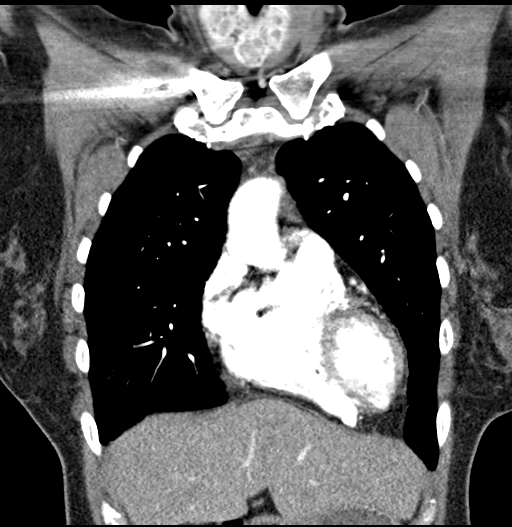
[im 49/98  soft-tissue]
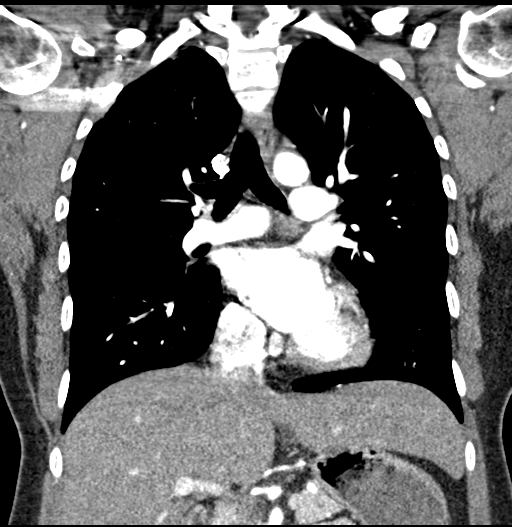
[im 73/98  soft-tissue]
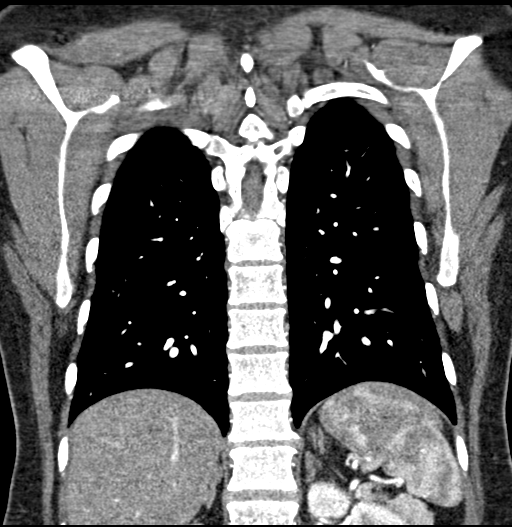

[18 of 46 positions shown; findings below may reference images not displayed]

FINDINGS: Cardiovascular: Aortic root measures 3.6 cm diameter. Mid ascending
aorta measures 4 cm diameter. Upper ascending aorta/proximal aortic
arch measures 3.5 cm diameter.

Distal arch is normal in caliber at 2 cm. Descending thoracic aorta
is normal in caliber.

Heart size is normal.  No pericardial effusion.

Mediastinum/Nodes: No mass or enlarged lymph nodes within the
mediastinum or perihilar regions. Esophagus is unremarkable. Trachea
and central bronchi are unremarkable.

Numerous hypodense foci throughout the thyroid gland, incompletely
imaged, largest within the left thyroid lobe measuring 1.5 cm.

Lungs/Pleura: Lungs are clear. No pleural effusion. No pulmonary
edema.

Upper Abdomen: Limited images of the upper abdomen are unremarkable.

Musculoskeletal: Mild degenerative spurring within the lower
thoracic spine. No acute or suspicious osseous finding.

Review of the MIP images confirms the above findings.
IMPRESSION: 1. Aneurysmal dilatation of the ascending aorta, measurements given
above.
2. No acute findings. Lungs are clear. No pneumonia or pulmonary
edema.
3. **An incidental finding of potential clinical significance has
been found. Numerous hypodense foci throughout the thyroid lobe,
incompletely imaged, largest hypodense lesion located within the
left thyroid lobe measuring 1.5 cm. Recommend further
characterization with nonemergent thyroid ultrasound.**

Aortic aneurysm NOS ([TX]-[TX]).

## 2016-12-05 MED ORDER — IOPAMIDOL (ISOVUE-370) INJECTION 76%
100.0000 mL | Freq: Once | INTRAVENOUS | Status: AC | PRN
  Administered 2016-12-05: 100 mL via INTRAVENOUS

## 2016-12-13 ENCOUNTER — Encounter: Payer: Self-pay | Admitting: Cardiovascular Disease

## 2016-12-13 ENCOUNTER — Ambulatory Visit (INDEPENDENT_AMBULATORY_CARE_PROVIDER_SITE_OTHER): Payer: BLUE CROSS/BLUE SHIELD | Admitting: Cardiovascular Disease

## 2016-12-13 ENCOUNTER — Telehealth: Payer: Self-pay | Admitting: Cardiovascular Disease

## 2016-12-13 VITALS — BP 121/77 | HR 76 | Ht 66.5 in | Wt 159.0 lb

## 2016-12-13 DIAGNOSIS — Q231 Congenital insufficiency of aortic valve: Secondary | ICD-10-CM

## 2016-12-13 DIAGNOSIS — Q230 Congenital stenosis of aortic valve: Secondary | ICD-10-CM

## 2016-12-13 DIAGNOSIS — R0609 Other forms of dyspnea: Secondary | ICD-10-CM | POA: Diagnosis not present

## 2016-12-13 DIAGNOSIS — R0789 Other chest pain: Secondary | ICD-10-CM | POA: Diagnosis not present

## 2016-12-13 NOTE — Telephone Encounter (Signed)
New Message  Pt call requesting to speak with someone to reschedule procedure on 7/23. Pt would like to reschedule for August. Please call back to discuss

## 2016-12-13 NOTE — Patient Instructions (Signed)
Medication Instructions: Your physician recommends that you continue on your current medications as directed. Please refer to the Current Medication list given to you today.   Testing/Procedures: Your physician has requested that you have a TEE with Dr. Sallyanne Kuster. During a TEE, sound waves are used to create images of your heart. It provides your doctor with information about the size and shape of your heart and how well your heart's chambers and valves are working. In this test, a transducer is attached to the end of a flexible tube that's guided down your throat and into your esophagus (the tube leading from you mouth to your stomach) to get a more detailed image of your heart. You are not awake for the procedure. Please see the instruction sheet given to you today. For further information please visit [email or web address].   Follow-Up: You have been referred to Dr. Halford Chessman for evaluation for Aortic Valve Replacement.  Your physician recommends that you schedule a follow-up appointment in: 3 months with Dr. Gwenlyn Found.  If you need a refill on your cardiac medications before your next appointment, please call your pharmacy.

## 2016-12-13 NOTE — Progress Notes (Signed)
12/13/2016 Charlene Carey   1976-04-01  [id number]  Primary Physician Debbrah Alar, NP Primary Cardiologist: Lorretta Harp MD Renae Gloss  HPI:  Charlene Carey is a very pleasant 40 year old mildly overweight married Caucasian female mother of 3 children . She saw Dr. Martinique in the remote past (2011). I last saw her in the office 11/08/16. There is a history of a murmur from childhood with what she describes as a bicuspid aortic valve. Dr. Martinique said that she would need this addressed at some point in the future. She has no other cardiovascular risk factors. She has noticed increasing dyspnea on exertion when walking up stairs and fatigue with some chest heaviness. She has had no problems with her deliveries. There is no family history of bicuspid aortic valve sclerosis. Since I saw her last she's gained approximately 15 pounds. She does complain of increasing dyspnea on exertion and some chest burning which she attributes to repeat reflux. A 2-D echocardiogram performed 10/29/16 revealed normal LV function with progression of her aortic stenosis now in the moderate range. Gradient was 54 mmHg with a mean of 35 and a valve area 0.89 cm. She recently was at [facility] and when trying to keep up with her children and husband noticed increasing dyspnea and chest. Burning rating to her back. Based on this we decided to proceed with outpatient right and left heart cath to define her anatomy and physiology on 11/18/16 revealing normal coronary arteries with only mild aortic stenosis. Her valve area calculated at 1.4 cm with a peak gradient of 20 mmHg. There is clearly a discrepancy between her transthoracic echo and her invasive hemodynamic measurements. I'm going to obtain a TEE and refer her to Dr. Roxy Manns for surgical evaluation.   Current Outpatient Prescriptions  Medication Sig Dispense Refill  . Fexofenadine HCl (MUCINEX ALLERGY PO) Take by mouth.     No current  facility-administered medications for this visit.     Allergies  Allergen Reactions  . Codeine Nausea Only    Has withdrawal when trying to stop med    Social History   Social History  . Marital status: Married    Spouse name: N/A  . Number of children: N/A  . Years of education: N/A   Occupational History  . Not on file.   Social History Main Topics  . Smoking status: Former Research scientist (life sciences)  . Smokeless tobacco: Never Used  . Alcohol use 0.6 oz/week    1 Standard drinks or equivalent per week     Comment: depends on day   . Drug use: No  . Sexual activity: Not on file   Other Topics Concern  . Not on file   Social History Narrative   Homeschools    [name]- born in 1997   [name]-2001   [name]- 2011 (from second marriage)   She is a stay at home mom   No pets   Completed 2 years of college    married (second marriage)   Enjoys baking, gardening, herb garden        Review of Systems: General: negative for chills, fever, night sweats or weight changes.  Cardiovascular: negative for chest pain, dyspnea on exertion, edema, orthopnea, palpitations, paroxysmal nocturnal dyspnea or shortness of breath Dermatological: negative for rash Respiratory: negative for cough or wheezing Urologic: negative for hematuria Abdominal: negative for nausea, vomiting, diarrhea, bright red blood per rectum, melena, or hematemesis Neurologic: negative for visual changes, syncope, or dizziness All  other systems reviewed and are otherwise negative except as noted above.    Blood pressure 121/77, pulse 76, height 5' 6.5" (1.689 m), weight 159 lb (72.1 kg).  General appearance: alert and no distress Neck: no adenopathy, no JVD, supple, symmetrical, trachea midline, thyroid not enlarged, symmetric, no tenderness/mass/nodules and Bilateral bruit bruits versus transmitted murmur Lungs: clear to auscultation bilaterally Heart: Successful check murmur consistent with aortic stenosis Extremities:  extremities normal, atraumatic, no cyanosis or edema and Right femoral arterial venous puncture sites well-healed  EKG not performed today  ASSESSMENT AND PLAN:   Aortic stenosis with bicuspid valve Zyaira returns today after her recent right left heart cath performed in evaluation of a bicuspid aortic valve. Her coronary arteries were completely normal. Valve area by hemodynamics was 1.47 cm with a gradient of 20 mmHg which is different than her echo suggested moderate aortic stenosis with valve area of 0.897 m and a peak gradient of 54. A CT angiogram of her thoracic aorta measured 4 cm. There is a discrepancy between hemodynamic and noninvasive data. She is clinically symptomatic with chest pain and dyspnea. I am going to order a transesophageal echo and refer her to Dr. Ricard Dillon for surgical consultation.      Lorretta Harp MD [place], [facility] 12/13/2016 10:27 AM

## 2016-12-13 NOTE — Assessment & Plan Note (Signed)
Charlene Carey returns today after her recent right left heart cath performed in evaluation of a bicuspid aortic valve. Her coronary arteries were completely normal. Valve area by hemodynamics was 1.47 cm with a gradient of 20 mmHg which is different than her echo suggested moderate aortic stenosis with valve area of 0.897 m and a peak gradient of 54. A CT angiogram of her thoracic aorta measured 4 cm. There is a discrepancy between hemodynamic and noninvasive data. She is clinically symptomatic with chest pain and dyspnea. I am going to order a transesophageal echo and refer her to Dr. Ricard Dillon for surgical consultation.

## 2016-12-13 NOTE — Telephone Encounter (Signed)
Spoke with patient regarding new date and time for TEE with Dr. Floydene Flock 01/27/17 @ 10:00am---arrival time 9:00am at Short Stay .  Informed patient I will mail new instruction letter.  She voiced her understanding.

## 2016-12-13 NOTE — Telephone Encounter (Signed)
Pt would like to re schedule procedure on 12/30/16, to send or third week in August. She stated that she had prior plans that she had forgotten about. Will route to scheduling

## 2016-12-31 ENCOUNTER — Encounter: Payer: BLUE CROSS/BLUE SHIELD | Admitting: Thoracic Surgery (Cardiothoracic Vascular Surgery)

## 2017-01-08 ENCOUNTER — Encounter: Payer: Self-pay | Admitting: Endocrinology

## 2017-01-08 ENCOUNTER — Ambulatory Visit (INDEPENDENT_AMBULATORY_CARE_PROVIDER_SITE_OTHER): Payer: BLUE CROSS/BLUE SHIELD | Admitting: Endocrinology

## 2017-01-08 ENCOUNTER — Ambulatory Visit: Payer: BLUE CROSS/BLUE SHIELD | Admitting: Endocrinology

## 2017-01-08 VITALS — BP 128/76 | HR 84 | Ht 66.5 in | Wt 160.4 lb

## 2017-01-08 DIAGNOSIS — R946 Abnormal results of thyroid function studies: Secondary | ICD-10-CM | POA: Diagnosis not present

## 2017-01-08 DIAGNOSIS — E042 Nontoxic multinodular goiter: Secondary | ICD-10-CM | POA: Diagnosis not present

## 2017-01-08 DIAGNOSIS — R7989 Other specified abnormal findings of blood chemistry: Secondary | ICD-10-CM

## 2017-01-08 NOTE — Progress Notes (Signed)
Patient ID: Charlene Carey, female   DOB: April 25, 1976, 41 y.o.   MRN: [id number]           Referring provider: Debbrah Alar  Reason for Appointment: Goiter, new consultation    History of Present Illness:    The patient's thyroid enlargement was first discovered in 2010 or so when she was pregnant and her gynecologist noticed a thyroid swelling and also she was told that her thyroid test was slightly abnormal  She was referred to an endocrinologist who sent her for thyroid biopsy, this was done on 2 of the dominant nodule she had an was benign She apparently did not have any subsequent follow-up  Recently with her seeing a new primary care provider she was recommended a follow-up ultrasound Patient does not herself feel any swelling in her neck area  She has had difficulty no with swallowing  Does not feel like she has any choking sensation in her neck or pressure in any position   She has been told that her thyroid levels have been borderline She does not complain of any shakiness, palpitations, heat intolerance, weight loss or sweating   Lab Results  Component Value Date   FREET4 1.17 11/13/2016   FREET4 1.4 10/16/2016   TSH 0.38 11/13/2016   TSH 0.34 (L) 10/16/2016    She has had an ultrasound exam in 11/2016 which showed the following Right mid nodule measuring 2.6 x 1.9 x 1.7 cm previously measured 2.1 x 1.8 x 1.4 cm. There are calcifications within the nodule. This was previously biopsied in 2010.  Left mid nodule today measuring 2.3 x 1.4 x 1.0 cm previously measured 2.1 x 1.4 x 0.8 cm. This underwent biopsy in 2010.  She also has a right mid nodule which is 1.8 centimeters in maximum size which is solid and hypoechoic with T1 Rads score 4 points  Thyroid biopsy in  2010 showed the following from the 2 nodules, both reports were similar   INTERPRETATION(S): BENIGN Comment: There is colloid with admixed lymphocytes suggesting a background of chronic  thyroiditis.  There are also follicular epithelial cells predominantlyin flat sheets.  The findings suggest a non-neoplastic process such as anon-neoplastic goiter/hyperplastic nodule    Allergies as of 01/08/2017      Reactions   Codeine Nausea Only   Has withdrawal when trying to stop med      Medication List       Accurate as of 01/08/17  2:16 PM. Always use your most recent med list.          MUCINEX ALLERGY PO Take by mouth.       Allergies:  Allergies  Allergen Reactions  . Codeine Nausea Only    Has withdrawal when trying to stop med    Past Medical History:  Diagnosis Date  . Allergy-induced asthma   . Aortic stenosis with bicuspid valve   . Frequent headaches   . Heartburn   . History of chicken pox   . History of gestational diabetes   . Seasonal allergies     Past Surgical History:  Procedure Laterality Date  . BREAST REDUCTION SURGERY    . OTHER SURGICAL HISTORY     had d& c  . RIGHT/LEFT HEART CATH AND CORONARY ANGIOGRAPHY N/A 11/18/2016   Procedure: Right/Left Heart Cath and Coronary Angiography;  Surgeon: Lorretta Harp, MD;  Location: [facility];  Service: Cardiovascular;  Laterality: N/A;    Family History  Problem Relation Age  of Onset  . Hypertension Mother   . Hypertension Maternal Grandfather   . Stroke Maternal Grandfather   . Hyperlipidemia Maternal Grandfather   . Diabetes Father     Social History:  reports that she has quit smoking. She has never used smokeless tobacco. She reports that she drinks about 0.6 oz of alcohol per week . She reports that she does not use drugs.   Review of Systems:  Review of Systems  Constitutional: Negative for weight loss.  HENT: Negative for trouble swallowing.   Respiratory: Positive for shortness of breath.        She feels short of breath with exertion such as going up stairs  Cardiovascular: Positive for chest pain. Negative for palpitations and leg swelling.       She may  feel a pressure sensation in her left upper chest.  She is being evaluated for aortic stenosis by cardiologist  Endocrine: Negative for fatigue and heat intolerance.  Musculoskeletal: Negative for joint pain.  Neurological: Positive for tremors.      Examination:   BP 128/76   Pulse 84   Ht 5' 6.5" (1.689 m)   Wt 160 lb 6.4 oz (72.8 kg)   SpO2 99%   BMI 25.50 kg/m    General Appearance: pleasant,  Averagely built and nourished, not unusually anxious or hyperkinetic          Eyes: No abnormal prominence or eyelid swelling.          Neck: The thyroid is enlarged diffusely but mostly on the right side. Neck circumference is about 38 cm over the thyroid The right lobe is fleshy, relatively smooth without any clear nodules and about 2. 5-3 times normal Has small areas of nodularity in the isthmus Left lobe was about twice normal in size and has some firmer areas  There is no stridor. Pemberton sign is negative  There is no lymphadenopathy.     Cardiovascular: S2 is loud.  Hard systolic ejection murmur at the base  Respiratory:  Lungs clear  Neurological: REFLEXES: at biceps are normal.  No tremor Skin: no rash        Assessment/Plan:  Multinodular goiter, long-standing Her previous needle aspiration biopsy of 2 separate nodules had showed a nonneoplastic goiter with areas of colloid and follicular cells that were benign She has a relatively new solid nodule 1.8 cm in size on recent ultrasound Interval between the 2 ultrasound exams is 8 years  Although the radiologist has recommended needle aspiration of this new solid nodule review of the patient's record indicates that she has had a benign multinodular goiter for several years with fairly identical cytology in both the nodules that she had evaluated previously The patient is not very keen on doing a biopsy at this time and she felt better was rather uncomfortable She has not had any interim follow-up for 8 years on her  nodules and most likely she has had slow-growing multiple nodules which are similar and pathology The patient is agreeable to have follow-up ultrasound in 6 months to reevaluate and consider any new last patient at that time Discussed with patient that chances of malignancy are very small overall and the nodule is relatively small in size currently  ABNORMAL TSH: She has no signs or symptoms of hyperthyroidism and the last TSH is normal Likely she had an autonomous thyroid, discussed potential for developing a hot nodule in the future  [facility] 01/08/2017   Consultation note to be routed through PCP  on closing the note

## 2017-01-14 ENCOUNTER — Ambulatory Visit (INDEPENDENT_AMBULATORY_CARE_PROVIDER_SITE_OTHER): Payer: BLUE CROSS/BLUE SHIELD | Admitting: Family

## 2017-01-14 ENCOUNTER — Encounter: Payer: Self-pay | Admitting: Family

## 2017-01-14 DIAGNOSIS — M79643 Pain in unspecified hand: Secondary | ICD-10-CM

## 2017-01-14 DIAGNOSIS — Z0000 Encounter for general adult medical examination without abnormal findings: Secondary | ICD-10-CM

## 2017-01-14 DIAGNOSIS — Z0001 Encounter for general adult medical examination with abnormal findings: Secondary | ICD-10-CM | POA: Diagnosis not present

## 2017-01-14 LAB — URINALYSIS, ROUTINE W REFLEX MICROSCOPIC
Bilirubin Urine: NEGATIVE
HGB URINE DIPSTICK: NEGATIVE
KETONES UR: NEGATIVE
Leukocytes, UA: NEGATIVE
NITRITE: NEGATIVE
RBC / HPF: NONE SEEN (ref 0–?)
Specific Gravity, Urine: 1.025 (ref 1.000–1.030)
Total Protein, Urine: NEGATIVE
URINE GLUCOSE: NEGATIVE
UROBILINOGEN UA: 0.2 (ref 0.0–1.0)
pH: 6 (ref 5.0–8.0)

## 2017-01-14 LAB — HEPATIC FUNCTION PANEL
ALK PHOS: 37 U/L — AB (ref 39–117)
ALT: 12 U/L (ref 0–35)
AST: 17 U/L (ref 0–37)
Albumin: 4.4 g/dL (ref 3.5–5.2)
BILIRUBIN DIRECT: 0.1 mg/dL (ref 0.0–0.3)
BILIRUBIN TOTAL: 0.5 mg/dL (ref 0.2–1.2)
Total Protein: 7.1 g/dL (ref 6.0–8.3)

## 2017-01-14 LAB — BASIC METABOLIC PANEL
BUN: 11 mg/dL (ref 6–23)
CALCIUM: 9.5 mg/dL (ref 8.4–10.5)
CO2: 28 mEq/L (ref 19–32)
Chloride: 105 mEq/L (ref 96–112)
Creatinine, Ser: 0.87 mg/dL (ref 0.40–1.20)
GFR: 75.36 mL/min (ref >60.00)
GLUCOSE: 100 mg/dL — AB (ref 70–99)
POTASSIUM: 5.2 meq/L — AB (ref 3.5–5.1)
Sodium: 140 mEq/L (ref 135–145)

## 2017-01-14 LAB — LIPID PANEL
CHOLESTEROL: 172 mg/dL (ref 0–200)
HDL: 52 mg/dL (ref >39.00)
LDL Cholesterol: 107 mg/dL — ABNORMAL HIGH (ref 0–99)
NonHDL: 120.49
TRIGLYCERIDES: 65 mg/dL (ref 0.0–149.0)
Total CHOL/HDL Ratio: 3
VLDL: 13 mg/dL (ref 0.0–40.0)

## 2017-01-14 LAB — CBC WITH DIFFERENTIAL/PLATELET
BASOS ABS: 0.1 10*3/uL (ref 0.0–0.1)
Basophils Relative: 2 % (ref 0.0–3.0)
EOS ABS: 0 10*3/uL (ref 0.0–0.7)
Eosinophils Relative: 0.6 % (ref 0.0–5.0)
HCT: 42.2 % (ref 36.0–46.0)
HEMOGLOBIN: 13.9 g/dL (ref 12.0–15.0)
LYMPHS ABS: 1.6 10*3/uL (ref 0.7–4.0)
Lymphocytes Relative: 31.4 % (ref 12.0–46.0)
MCHC: 32.9 g/dL (ref 30.0–36.0)
MCV: 93.5 fl (ref 78.0–100.0)
MONO ABS: 0.4 10*3/uL (ref 0.1–1.0)
Monocytes Relative: 8.6 % (ref 3.0–12.0)
NEUTROS PCT: 57.4 % (ref 43.0–77.0)
Neutro Abs: 2.9 10*3/uL (ref 1.4–7.7)
Platelets: 221 10*3/uL (ref 150.0–400.0)
RBC: 4.52 Mil/uL (ref 3.87–5.11)
RDW: 12.7 % (ref 11.5–15.5)
WBC: 5 10*3/uL (ref 4.0–10.5)

## 2017-01-14 LAB — SEDIMENTATION RATE: Sed Rate: 1 mm/hr (ref 0–20)

## 2017-01-14 NOTE — Patient Instructions (Signed)
Please complete lab work prior to leaving. Work on [employer], exercise.

## 2017-01-14 NOTE — Progress Notes (Signed)
Pre visit review using our clinic review tool, if applicable. No additional management support is needed unless otherwise documented below in the visit note. 

## 2017-01-14 NOTE — Progress Notes (Signed)
Subjective:    Patient ID: Charlene Carey, female    DOB: [birth year], 41 y.o.   MRN: [id number]  HPI  [name] is a 41 yr old female who presents today for cpx.  Immunizations: tetanus 2011 Diet: diet is healthy Exercise: some exercise, hiking/walks in the neighborhood Pap Smear: up to date per pt- done by gyn Mammogram: 01/14/17, normal Vision:  2/18 Dental:  Up to date.     Review of Systems  Constitutional: Positive for unexpected weight change.  HENT: Positive for hearing loss.        Reports some issues with low tones, declines follow up at this time  Eyes: Negative for visual disturbance.  Respiratory: Positive for chest tightness.        + DOE  Cardiovascular: Negative for leg swelling.  Gastrointestinal: Negative for constipation and diarrhea.  Genitourinary: Negative for dysuria.  Musculoskeletal: Negative for myalgias.       Some hand pain in the AM, improves with improved diet.   Neurological: Negative for headaches.  Hematological: Negative for adenopathy.  Psychiatric/Behavioral:       Anxiety has been fair.        Past Medical History:  Diagnosis Date  . Allergy-induced asthma   . Aortic stenosis with bicuspid valve   . Frequent headaches   . Heartburn   . History of chicken pox   . History of gestational diabetes   . Seasonal allergies      Social History   Social History  . Marital status: Married    Spouse name: N/A  . Number of children: N/A  . Years of education: N/A   Occupational History  . Not on file.   Social History Main Topics  . Smoking status: Former Research scientist (life sciences)  . Smokeless tobacco: Never Used  . Alcohol use 0.6 oz/week    1 Standard drinks or equivalent per week     Comment: depends on day   . Drug use: No  . Sexual activity: Not on file   Other Topics Concern  . Not on file   Social History Narrative   Homeschools    Terrence Carey- born in 1997   Charlene Carey-1999   Charlene Carey- 2011 (from second marriage)   She is a stay at  home mom   No pets   Completed 2 years of college    married (second marriage)   Enjoys Control and instrumentation engineer, gardening, herb garden       Past Surgical History:  Procedure Laterality Date  . BREAST REDUCTION SURGERY    . OTHER SURGICAL HISTORY     had d& c  . RIGHT/LEFT HEART CATH AND CORONARY ANGIOGRAPHY N/A 11/18/2016   Procedure: Right/Left Heart Cath and Coronary Angiography;  Surgeon: Lorretta Harp, MD;  Location: [facility];  Service: Cardiovascular;  Laterality: N/A;    Family History  Problem Relation Age of Onset  . Hypertension Mother   . Thyroid disease Mother        Hypothyroid  . Hypertension Maternal Grandfather   . Stroke Maternal Grandfather   . Hyperlipidemia Maternal Grandfather   . Diabetes Father     Allergies  Allergen Reactions  . Codeine Nausea Only    Has withdrawal when trying to stop med    No current outpatient prescriptions on file prior to visit.   No current facility-administered medications on file prior to visit.     BP 118/82 (BP Location: Left Arm, Patient Position: Sitting, Cuff Size: Normal)  Pulse 87   Temp 98 F (36.7 C) (Oral)   Ht 5' 6.5" (1.689 m)   Wt 157 lb 2 oz (71.3 kg)   SpO2 96%   BMI 24.98 kg/m    Objective:   Physical Exam  Physical Exam  Constitutional: She is oriented to person, place, and time. She appears well-developed and well-nourished. No distress.  HENT:  Head: Normocephalic and atraumatic.  Right Ear: Tympanic membrane and ear canal normal.  Left Ear: Tympanic membrane and ear canal normal.  Mouth/Throat: Oropharynx is clear and moist.  Eyes: Pupils are equal, round, and reactive to light. No scleral icterus.  Neck: Normal range of motion. No thyromegaly present.  Cardiovascular: Normal rate and regular rhythm.   Grade 2 systolic murmur heard. Pulmonary/Chest: Effort normal and breath sounds normal. No respiratory distress. He has no wheezes. She has no rales. She exhibits no tenderness.    Abdominal: Soft. Bowel sounds are normal. She exhibits no distension and no mass. There is no tenderness. There is no rebound and no guarding.  Musculoskeletal: She exhibits no edema.  Lymphadenopathy:    She has no cervical adenopathy.  Neurological: She is alert and oriented to person, place, and time. She has normal patellar reflexes. She exhibits normal muscle tone. Coordination normal.  Skin: Skin is warm and dry.  Psychiatric: She has a normal mood and affect. Her behavior is normal. Judgment and thought content normal.  Breast/pelvic deferred           Assessment & Plan:    Preventive care-will obtain routine labs. Tetanus is up-to-date. Pap smear and mammogram are up-to-date. Discussed healthy diet and regular exercise. Of note she has had extensive cardiac workup and this is ongoing.  Hand pain-will check ESR rheumatoid factor and a N/A for further evaluation.     Assessment & Plan:

## 2017-01-15 LAB — RHEUMATOID FACTOR

## 2017-01-15 LAB — ANA: Anti Nuclear Antibody(ANA): NEGATIVE

## 2017-01-18 ENCOUNTER — Telehealth: Payer: Self-pay | Admitting: Family

## 2017-01-18 DIAGNOSIS — E875 Hyperkalemia: Secondary | ICD-10-CM

## 2017-01-18 NOTE — Telephone Encounter (Signed)
Potassium is  Mildly elevated.  I would like her to repeat her basic metabolic panel in 1 week please. Other lab work looks good.

## 2017-01-21 NOTE — Telephone Encounter (Signed)
Notified pt and she voices understanding. Lab appt scheduled for 01/29/17 at 9:30am, future order placed.

## 2017-01-21 NOTE — Telephone Encounter (Signed)
Left message for pt to return my call.

## 2017-01-24 ENCOUNTER — Other Ambulatory Visit: Payer: Self-pay | Admitting: Cardiovascular Disease

## 2017-01-24 DIAGNOSIS — I350 Nonrheumatic aortic (valve) stenosis: Secondary | ICD-10-CM

## 2017-01-27 ENCOUNTER — Ambulatory Visit (HOSPITAL_COMMUNITY)
Admission: RE | Admit: 2017-01-27 | Discharge: 2017-01-27 | Disposition: A | Discharge status: Home / Self Care | Payer: BLUE CROSS/BLUE SHIELD | Source: Ambulatory Visit | Attending: Cardiovascular Disease | Admitting: Cardiovascular Disease

## 2017-01-27 ENCOUNTER — Encounter (HOSPITAL_COMMUNITY)
Admission: RE | Disposition: A | Discharge status: Home / Self Care | Payer: Self-pay | Source: Ambulatory Visit | Attending: Cardiovascular Disease

## 2017-01-27 ENCOUNTER — Ambulatory Visit (HOSPITAL_BASED_OUTPATIENT_CLINIC_OR_DEPARTMENT_OTHER)
Admission: RE | Admit: 2017-01-27 | Discharge: 2017-01-27 | Disposition: A | Discharge status: Home / Self Care | Payer: BLUE CROSS/BLUE SHIELD | Source: Ambulatory Visit | Attending: Cardiovascular Disease | Admitting: Cardiovascular Disease

## 2017-01-27 ENCOUNTER — Encounter (HOSPITAL_COMMUNITY): Payer: Self-pay | Admitting: Cardiovascular Disease

## 2017-01-27 DIAGNOSIS — I350 Nonrheumatic aortic (valve) stenosis: Secondary | ICD-10-CM | POA: Diagnosis not present

## 2017-01-27 DIAGNOSIS — Q231 Congenital insufficiency of aortic valve: Secondary | ICD-10-CM | POA: Diagnosis not present

## 2017-01-27 DIAGNOSIS — R12 Heartburn: Secondary | ICD-10-CM | POA: Insufficient documentation

## 2017-01-27 DIAGNOSIS — Z87891 Personal history of nicotine dependence: Secondary | ICD-10-CM | POA: Diagnosis not present

## 2017-01-27 DIAGNOSIS — Q230 Congenital stenosis of aortic valve: Secondary | ICD-10-CM

## 2017-01-27 DIAGNOSIS — Z8249 Family history of ischemic heart disease and other diseases of the circulatory system: Secondary | ICD-10-CM | POA: Diagnosis not present

## 2017-01-27 DIAGNOSIS — R51 Headache: Secondary | ICD-10-CM | POA: Insufficient documentation

## 2017-01-27 DIAGNOSIS — Z885 Allergy status to narcotic agent status: Secondary | ICD-10-CM | POA: Diagnosis not present

## 2017-01-27 HISTORY — PX: TEE WITHOUT CARDIOVERSION: SHX5443

## 2017-01-27 SURGERY — ECHOCARDIOGRAM, TRANSESOPHAGEAL
Anesthesia: Moderate Sedation

## 2017-01-27 MED ORDER — DIPHENHYDRAMINE HCL 50 MG/ML IJ SOLN
INTRAMUSCULAR | Status: AC
  Filled 2017-01-27: qty 1

## 2017-01-27 MED ORDER — MIDAZOLAM HCL 10 MG/2ML IJ SOLN
INTRAMUSCULAR | Status: DC | PRN
  Administered 2017-01-27: 2 mg via INTRAVENOUS
  Administered 2017-01-27 (×2): 1 mg via INTRAVENOUS
  Administered 2017-01-27: 2 mg via INTRAVENOUS

## 2017-01-27 MED ORDER — SODIUM CHLORIDE 0.9 % IV SOLN: INTRAVENOUS | Status: DC

## 2017-01-27 MED ORDER — BUTAMBEN-TETRACAINE-BENZOCAINE 2-2-14 % EX AERO
INHALATION_SPRAY | CUTANEOUS | Status: DC | PRN
  Administered 2017-01-27: 2 via TOPICAL

## 2017-01-27 MED ORDER — MIDAZOLAM HCL 5 MG/ML IJ SOLN
INTRAMUSCULAR | Status: AC
  Filled 2017-01-27: qty 2

## 2017-01-27 MED ORDER — FENTANYL CITRATE (PF) 100 MCG/2ML IJ SOLN
INTRAMUSCULAR | Status: AC
  Filled 2017-01-27: qty 2

## 2017-01-27 MED ORDER — FENTANYL CITRATE (PF) 100 MCG/2ML IJ SOLN
INTRAMUSCULAR | Status: DC | PRN
Start: 2017-01-27 — End: 2017-01-27
  Administered 2017-01-27 (×2): 25 ug via INTRAVENOUS

## 2017-01-27 NOTE — Op Note (Signed)
INDICATIONS: aortic stenosis  PROCEDURE:   Informed consent was obtained prior to the procedure. The risks, benefits and alternatives for the procedure were discussed and the patient comprehended these risks.  Risks include, but are not limited to, cough, sore throat, vomiting, nausea, somnolence, esophageal and stomach trauma or perforation, bleeding, low blood pressure, aspiration, pneumonia, infection, trauma to the teeth and death.    After a procedural time-out, the oropharynx was anesthetized with 20% benzocaine spray.   During this procedure the patient was administered a total of Versed 6 mg and Fentanyl 50 mg to achieve and maintain moderate conscious sedation.  The patient's heart rate, blood pressure, and oxygen saturationweare monitored continuously during the procedure. The period of conscious sedation was 16 minutes, of which I was present face-to-face 100% of this time.  The transesophageal probe was inserted in the esophagus and stomach without difficulty and multiple views were obtained.  The patient was kept under observation until the patient left the procedure room.  The patient left the procedure room in stable condition.   Agitated microbubble saline contrast was not administered.  COMPLICATIONS:    There were no immediate complications.  FINDINGS:  Bicuspid aortic valve with moderate stenosis, trivial regurgitation. Normal LVEF. No coarctation is seen.  RECOMMENDATIONS:    Will send report to Dr. Gwenlyn Found and Dr. Roxy Manns  Time Spent Directly with the Patient:  30 minutes   Destine Ambroise 01/27/2017, 10:36 AM

## 2017-01-27 NOTE — H&P (Signed)
   Chief Complaint:  Aortic stenosis  HPI:  This is a 41 y.o. female with a past medical history significant for bicuspid aortic valve, but with only mild gradients at cardiac cath, with recent exertional angina and dyspnea, referred for TEE   PMHx:  Past Medical History:  Diagnosis Date  . Allergy-induced asthma   . Aortic stenosis with bicuspid valve   . Frequent headaches   . Heartburn   . History of chicken pox   . History of gestational diabetes   . Seasonal allergies     Past Surgical History:  Procedure Laterality Date  . BREAST REDUCTION SURGERY    . OTHER SURGICAL HISTORY     had d& c  . RIGHT/LEFT HEART CATH AND CORONARY ANGIOGRAPHY N/A 11/18/2016   Procedure: Right/Left Heart Cath and Coronary Angiography;  Surgeon: Lorretta Harp, MD;  Location: [facility];  Service: Cardiovascular;  Laterality: N/A;    FAMHx:  Family History  Problem Relation Age of Onset  . Hypertension Mother   . Thyroid disease Mother        Hypothyroid  . Hypertension Maternal Grandfather   . Stroke Maternal Grandfather   . Hyperlipidemia Maternal Grandfather   . Diabetes Father     SOCHx:   reports that she has quit smoking. She has never used smokeless tobacco. She reports that she drinks about 0.6 oz of alcohol per week . She reports that she does not use drugs.  ALLERGIES:  Allergies  Allergen Reactions  . Codeine Nausea Only    Has withdrawal when trying to stop med    ROS: Pertinent items noted in HPI and remainder of comprehensive ROS otherwise negative.  HOME MEDS: No prescriptions prior to admission.    LABS/IMAGING: No results found for this or any previous visit (from the past 48 hour(s)). No results found.  VITALS: There were no vitals taken for this visit.  EXAM:  General: Alert, oriented x3, no distress Head: no evidence of trauma, PERRL, EOMI, no exophtalmos or lid lag, no myxedema, no xanthelasma; normal ears, nose and oropharynx Neck:  normal jugular venous pulsations and no hepatojugular reflux; brisk carotid pulses without delay and no carotid bruits Chest: clear to auscultation, no signs of consolidation by percussion or palpation, normal fremitus, symmetrical and full respiratory excursions Cardiovascular: normal position and quality of the apical impulse, regular rhythm, normal first heart sound and second heart sounds, no rubs or gallops, 2-3/6 aortic ejection murmur Abdomen: no tenderness or distention, no masses by palpation, no abnormal pulsatility or arterial bruits, normal bowel sounds, no hepatosplenomegaly Extremities: no clubbing, cyanosis or edema; 2+ radial, ulnar and brachial pulses bilaterally; 2+ right femoral, posterior tibial and dorsalis pedis pulses; 2+ left femoral, posterior tibial and dorsalis pedis pulses; no subclavian or femoral bruits Neurological: grossly nonfocal   IMPRESSION: 41 yo with symptoms of hemodynamically significant AS due to bicuspid aortic valve and only mild gradients at cath, here for TEE  PLAN: This procedure has been fully reviewed with the patient and written informed consent has been obtained.   Sanda Klein, MD, [facility] [phone] office [phone] pager  01/27/2017, 9:14 AM

## 2017-01-27 NOTE — Discharge Instructions (Signed)
Moderate Conscious Sedation, Adult °Sedation is the use of medicines to promote relaxation and relieve discomfort and anxiety. Moderate conscious sedation is a type of sedation. Under moderate conscious sedation, you are less alert than normal, but you are still able to respond to instructions, touch, or both. °Moderate conscious sedation is used during short medical and dental procedures. It is milder than deep sedation, which is a type of sedation under which you cannot be easily woken up. It is also milder than general anesthesia, which is the use of medicines to make you unconscious. Moderate conscious sedation allows you to return to your regular activities sooner. °Tell a health care provider about: °· Any allergies you have. °· All medicines you are taking, including vitamins, herbs, eye drops, creams, and over-the-counter medicines. °· Use of steroids (by mouth or creams). °· Any problems you or family members have had with sedatives and anesthetic medicines. °· Any blood disorders you have. °· Any surgeries you have had. °· Any medical conditions you have, such as sleep apnea. °· Whether you are pregnant or may be pregnant. °· Any use of cigarettes, alcohol, marijuana, or street drugs. °What are the risks? °Generally, this is a safe procedure. However, problems may occur, including: °· Getting too much medicine (oversedation). °· Nausea. °· Allergic reaction to medicines. °· Trouble breathing. If this happens, a breathing tube may be used to help with breathing. It will be removed when you are awake and breathing on your own. °· Heart trouble. °· Lung trouble. ° °What happens before the procedure? °Staying hydrated °Follow instructions from your health care provider about hydration, which may include: °· Up to 2 hours before the procedure - you may continue to drink clear liquids, such as water, clear fruit juice, black coffee, and plain tea. ° °Eating and drinking restrictions °Follow instructions from  your health care provider about eating and drinking, which may include: °· 8 hours before the procedure - stop eating heavy meals or foods such as meat, fried foods, or fatty foods. °· 6 hours before the procedure - stop eating light meals or foods, such as toast or cereal. °· 6 hours before the procedure - stop drinking milk or drinks that contain milk. °· 2 hours before the procedure - stop drinking clear liquids. ° °Medicine ° °Ask your health care provider about: °· Changing or stopping your regular medicines. This is especially important if you are taking diabetes medicines or blood thinners. °· Taking medicines such as aspirin and ibuprofen. These medicines can thin your blood. Do not take these medicines before your procedure if your health care provider instructs you not to. ° °Tests and exams °· You will have a physical exam. °· You may have blood tests done to show: °? How well your kidneys and liver are working. °? How well your blood can clot. °General instructions °· Plan to have someone take you home from the hospital or clinic. °· If you will be going home right after the procedure, plan to have someone with you for 24 hours. °What happens during the procedure? °· An IV tube will be inserted into one of your veins. °· Medicine to help you relax (sedative) will be given through the IV tube. °· The medical or dental procedure will be performed. °What happens after the procedure? °· Your blood pressure, heart rate, breathing rate, and blood oxygen level will be monitored often until the medicines you were given have worn off. °· Do not drive for 24 hours. °  This information is not intended to replace advice given to you by your health care provider. Make sure you discuss any questions you have with your health care provider. °Document Released: 02/19/2001 Document Revised: 10/31/2015 Document Reviewed: 09/16/2015 °Elsevier Interactive Patient Education © 2018 [employer] ° °

## 2017-01-27 NOTE — Progress Notes (Signed)
  Echocardiogram Echocardiogram Transesophageal has been performed.  Jennette Dubin 01/27/2017, 11:08 AM

## 2017-01-29 ENCOUNTER — Other Ambulatory Visit (INDEPENDENT_AMBULATORY_CARE_PROVIDER_SITE_OTHER): Payer: BLUE CROSS/BLUE SHIELD

## 2017-01-29 DIAGNOSIS — E875 Hyperkalemia: Secondary | ICD-10-CM

## 2017-01-29 LAB — BASIC METABOLIC PANEL
BUN: 8 mg/dL (ref 6–23)
CALCIUM: 9.6 mg/dL (ref 8.4–10.5)
CO2: 30 meq/L (ref 19–32)
CREATININE: 0.82 mg/dL (ref 0.40–1.20)
Chloride: 105 mEq/L (ref 96–112)
GFR: 80.67 mL/min (ref >60.00)
Glucose, Bld: 101 mg/dL — ABNORMAL HIGH (ref 70–99)
Potassium: 4.6 mEq/L (ref 3.5–5.1)
Sodium: 140 mEq/L (ref 135–145)

## 2017-01-30 ENCOUNTER — Encounter: Payer: Self-pay | Admitting: Thoracic Surgery (Cardiothoracic Vascular Surgery)

## 2017-01-30 ENCOUNTER — Institutional Professional Consult (permissible substitution) (INDEPENDENT_AMBULATORY_CARE_PROVIDER_SITE_OTHER): Payer: BLUE CROSS/BLUE SHIELD | Admitting: Thoracic Surgery (Cardiothoracic Vascular Surgery)

## 2017-01-30 VITALS — BP 123/79 | HR 76 | Ht 66.0 in | Wt 157.0 lb

## 2017-01-30 DIAGNOSIS — Q230 Congenital stenosis of aortic valve: Secondary | ICD-10-CM | POA: Diagnosis not present

## 2017-01-30 DIAGNOSIS — Q231 Congenital insufficiency of aortic valve: Secondary | ICD-10-CM | POA: Diagnosis not present

## 2017-01-30 DIAGNOSIS — I712 Thoracic aortic aneurysm, without rupture: Secondary | ICD-10-CM

## 2017-01-30 DIAGNOSIS — I350 Nonrheumatic aortic (valve) stenosis: Secondary | ICD-10-CM

## 2017-01-30 DIAGNOSIS — I7121 Aneurysm of the ascending aorta, without rupture: Secondary | ICD-10-CM | POA: Insufficient documentation

## 2017-01-30 NOTE — Progress Notes (Signed)
[facility]  MULTIDISCIPLINARY HEART VALVE CLINIC  CARDIOTHORACIC SURGERY CONSULTATION REPORT  Referring Provider is Lorretta Harp, MD PCP is Debbrah Alar, NP  Chief Complaint  Patient presents with  . Aortic Stenosis    Surgical eval, ECHO 01/27/17, Cardiac Cath 11/18/16    HPI:  Patient is a 41 year old female with history of bicuspid aortic valve and aortic stenosis who has been referred for elective surgical consultation. The patient states that she was first noted to have a heart murmur during childhood. She was not formally evaluated by cardiologist and she was never told to avoid strenuous physical exertion during her childhood and adolescence.  She had been followed by Dr. Martinique in the past and diagnosed with bicuspid aortic valve and aortic stenosis, most recently in 2011.  Echocardiogram performed in 2011 revealed likely bicuspid aortic valve with moderate aortic stenosis and normal left ventricular systolic function. Peak velocity across the aortic valve was reported 3.12 m/s corresponding to mean transvalvular gradient estimated 23 mmHg and aortic valve area estimated 1.08 cm. She was told that eventually she would likely need to have her aortic valve replaced.  2 years ago she was initially seen by Dr. Gwenlyn Found at which time she complained of exertional shortness of breath, fatigue, and chest heaviness with activity.  Echocardiogram performed 01/31/2015 revealed peak velocity across the aortic valve reported 3.31 m/s corresponding to peak and mean transvalvular gradients estimated 44 and 24 mmHg respectively. Aortic valve area was calculated 1.06 cm and the DVI was reported 0.3.  Left ventricular size and systolic function remained normal. The patient was recently seen again in follow-up by Dr. Gwenlyn Found and continues to complain of symptoms of intermittent chest discomfort and some exertional shortness of breath.  Follow-up echocardiogram revealed peak velocity  across the aortic valve reported 3.80 m/s corresponding to peak and mean transvalvular gradients estimated 54 and 35 mmHg respectively. Aortic valve area was calculated 0.89 cm and the DVI reported 0.28.  Left and right heart catheterization was performed 11/18/2016. The patient was noted to have normal coronary artery anatomy with no significant coronary artery disease. Peak to peak and mean transvalvular gradients measured across the aortic valve were reported 22 and 19.2 mmHg respectively corresponding to aortic valve area calculated 1.47 cm. Pulmonary artery pressures were normal. Given the discrepancy between findings on transthoracic echocardiogram and diagnostic cardiac catheterization, transesophageal echocardiogram was performed 01/27/2017. Findings were notable for congenitally bicuspid aortic valve with mildly thickened leaflets and only moderately restricted leaflet mobility. There was moderate aortic stenosis with valve area by planimetry measured 1.2 cm. Mean transvalvular gradient was estimated 23 mmHg and the DVI reported 0.33. Cardiothoracic surgical consultation was requested.  Patient is married and lives locally in [place] with her husband. 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 She admits that she does not exercise on a regular basis and she has never been physically active, but she reports no significant physical limitations. She admits to chronic anxiety. She has concerns regarding numerous physical symptoms including symptoms of tightness and pressure across her chest that seemed to come and go both with activity and at rest. She does experience some shortness of breath and heaviness in her chest with more strenuous exertion such as going on a strenuous hike with her family. She now avoids such activities. She also gets tightness across her chest when she is feeling nervous or upset. She denies any resting shortness of breath, PND, orthopnea, or lower extremity edema.  She has concerns regarding intermittent vague pain along her left lower leg that reportedly dates back to a previous injury in the distant past. 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  Past Medical History:  Diagnosis Date  . Allergy-induced asthma   . Aortic stenosis with bicuspid valve   . Frequent headaches   . Heartburn   . History of chicken pox   . History of gestational diabetes   . Multinodular goiter   . Seasonal allergies     Past Surgical History:  Procedure Laterality Date  . BREAST REDUCTION SURGERY    . OTHER SURGICAL HISTORY     had d& c  . RIGHT/LEFT HEART CATH AND CORONARY ANGIOGRAPHY N/A 11/18/2016   Procedure: Right/Left Heart Cath and Coronary Angiography;  Surgeon: Lorretta Harp, MD;  Location: [facility];  Service: Cardiovascular;  Laterality: N/A;  . TEE WITHOUT CARDIOVERSION N/A 01/27/2017   Procedure: TRANSESOPHAGEAL ECHOCARDIOGRAM (TEE);  Surgeon: Sanda Klein, MD;  Location: [facility]COPY;  Service: Cardiovascular;  Laterality: N/A;    Family History  Problem Relation Age of Onset  . Hypertension Mother   . Thyroid disease Mother        Hypothyroid  . Hypertension Maternal Grandfather   . Stroke Maternal Grandfather   . Hyperlipidemia Maternal Grandfather   . Diabetes Father     Social History   Social History  . Marital status: Married    Spouse name: N/A  . Number of children: N/A  . Years of education: N/A   Occupational History  . Not on file.   Social History Main Topics  . Smoking status: Former Research scientist (life sciences)  . Smokeless tobacco: Never Used  . Alcohol use 0.6 oz/week    1 Standard drinks or equivalent per week     Comment: depends on day   . Drug use: No  . Sexual activity: Not on file   Other Topics Concern  . Not on file   Social History Narrative   Homeschools    [name]- born in 1997   [name]-1999   [name]- 2011 (from second marriage)    She is a stay at home mom   No pets   Completed 2 years of college    married (second marriage)   Enjoys Control and instrumentation engineer, gardening, herb garden       No current outpatient prescriptions on file.   No current facility-administered medications for this visit.     Allergies  Allergen Reactions  . Codeine Nausea Only    Has withdrawal when trying to stop med      Review of Systems:   General:  normal appetite, stable energy, no weight gain, no weight loss, no fever  Cardiac:  + chest pain with exertion, occasional chest pain at rest, +SOB with exertion, no resting SOB, no PND, no orthopnea, no palpitations, no arrhythmia, no atrial fibrillation, no LE edema, no dizzy spells, no syncope  Respiratory:  no shortness of breath, no home oxygen, no productive cough, no dry cough, no bronchitis, no wheezing, no hemoptysis, no asthma, no pain with inspiration or cough, no sleep apnea, no CPAP at night  GI:   no difficulty swallowing, no reflux, no frequent heartburn, no hiatal hernia, no abdominal pain, no constipation, no diarrhea, no hematochezia, no hematemesis, no melena  GU:   no dysuria,  no frequency, no urinary tract infection, no hematuria, no kidney stones, no kidney  disease  Vascular:  no pain suggestive of claudication, no pain in feet, no leg cramps, no varicose veins, no DVT, no non-healing foot ulcer  Neuro:   no stroke, no TIA's, no seizures, + headaches, no temporary blindness one eye,  no slurred speech, no peripheral neuropathy, no chronic pain, no instability of gait, no memory/cognitive dysfunction  Musculoskeletal: + arthritis, no joint swelling, no myalgias, no difficulty walking, normal mobility   Skin:   no rash, no itching, no skin infections, no pressure sores or ulcerations  Psych:   + anxiety, no depression, + nervousness, no unusual recent stress  Eyes:   no blurry vision, no floaters, no recent vision changes, + wears glasses or contacts  ENT:   no hearing loss, no loose  or painful teeth, no dentures  Hematologic:  + easy bruising, no abnormal bleeding, no clotting disorder, no frequent epistaxis  Endocrine:  no diabetes, does not check CBG's at home           Physical Exam:   BP 123/79   Pulse 76   Ht 5\' 6"  (1.676 m)   Wt 157 lb (71.2 kg)   BMI 25.34 kg/m   General:    well-appearing, somewhat anxious  HEENT:  Unremarkable   Neck:   no JVD, no bruits, no adenopathy   Chest:   clear to auscultation, symmetrical breath sounds, no wheezes, no rhonchi   CV:   RRR, grade III/VI crescendo/decrescendo murmur heard best at RSB,  no diastolic murmur  Abdomen:  soft, non-tender, no masses   Extremities:  warm, well-perfused, pulses palpable, no LE edema  Rectal/GU  Deferred  Neuro:   Grossly non-focal and symmetrical throughout  Skin:   Clean and dry, no rashes, no breakdown   Diagnostic Tests:  Transthoracic Echocardiography  Patient:    Shay, Jhaveri MR #:       [id number] Study Date: 10/29/2016 Gender:     F Age:        53 Height:     168.9 cm Weight:     69.9 kg BSA:        1.82 m^2 Pt. Status: Room:   ORDERING     Quay Burow, MD  REFERRING    Quay Burow, MD  SONOGRAPHER  [name], [facility]  ATTENDING    Skeet Latch, MD  cc:  ------------------------------------------------------------------- LV EF: 55% -   60%  ------------------------------------------------------------------- Indications:      Biscuspid aortic valve (Q23).  ------------------------------------------------------------------- History:   PMH:   Dyspnea.  Risk factors:  Bicuspid aortic valve. Former tobacco use.  ------------------------------------------------------------------- Study Conclusions  - Left ventricle: The cavity size was normal. Systolic function was   normal. The estimated ejection fraction was in the range of 55%   to 60%. Wall motion was normal; there were no regional wall   motion  abnormalities. Left ventricular diastolic function   parameters were normal. - Aortic valve: There was moderate stenosis. There was no   regurgitation. Peak velocity (S): 380 cm/s. Mean gradient (S): 35   mm Hg. - Mitral valve: Transvalvular velocity was within the normal range.   There was no evidence for stenosis. There was trivial   regurgitation. - Right ventricle: The cavity size was normal. Wall thickness was   normal. Systolic function was normal. - Atrial septum: No defect or patent foramen ovale was identified   by color flow Doppler. - Tricuspid valve: There was trivial regurgitation. - Pulmonary  arteries: Systolic pressure was within the normal   range. PA peak pressure: 40 mm Hg (S).  Impressions:  - Compared with the echo 01/2015, the mean aortic valve gradient has   increased from 24 mmHg to 35 mmHg.  ------------------------------------------------------------------- Study data:  Comparison was made to the study of 01/31/2015.  Study status:  Routine.  Procedure:  The patient reported no pain pre or post test. Transthoracic echocardiography. Image quality was adequate.  Study completion:  There were no complications. Transthoracic echocardiography.  M-mode, complete 2D, spectral Doppler, and color Doppler.  Birthdate:  Patient birthdate: 06-Sep-1975.  Age:  Patient is 41 yr old.  Sex:  Gender: female. BMI: 24.5 kg/m^2.  Blood pressure:     118/78  Patient status: Outpatient.  Study date:  Study date: 10/29/2016. Study time: 12:57 PM.  Location:  [facility]  -------------------------------------------------------------------  ------------------------------------------------------------------- Left ventricle:  The cavity size was normal. Systolic function was normal. The estimated ejection fraction was in the range of 55% to 60%. Wall motion was normal; there were no regional wall motion abnormalities. The transmitral flow pattern was normal.  The deceleration time of the early transmitral flow velocity was normal. The pulmonary vein flow pattern was normal. The tissue Doppler parameters were normal. Left ventricular diastolic function parameters were normal.  ------------------------------------------------------------------- Aortic valve:  Not well visualized.  Doppler:   There was moderate stenosis.   There was no regurgitation.    VTI ratio of LVOT to aortic valve: 0.28. Valve area (VTI): 0.89 cm^2. Indexed valve area (VTI): 0.49 cm^2/m^2. Peak velocity ratio of LVOT to aortic valve: 0.26. Valve area (Vmax): 0.82 cm^2. Indexed valve area (Vmax): 0.45 cm^2/m^2. Mean velocity ratio of LVOT to aortic valve: 0.25. Valve area (Vmean): 0.79 cm^2. Indexed valve area (Vmean): 0.43 cm^2/m^2.    Mean gradient (S): 35 mm Hg. Peak gradient (S): 54 mm Hg.  ------------------------------------------------------------------- Aorta:  Aortic root: The aortic root was normal in size.  ------------------------------------------------------------------- Mitral valve:   Structurally normal valve.   Mobility was not restricted.  Doppler:  Transvalvular velocity was within the normal range. There was no evidence for stenosis. There was trivial regurgitation.    Valve area by pressure half-time: 3.38 cm^2. Indexed valve area by pressure half-time: 1.86 cm^2/m^2.    Peak gradient (D): 5 mm Hg.  ------------------------------------------------------------------- Left atrium:  The atrium was normal in size.  ------------------------------------------------------------------- Atrial septum:  No defect or patent foramen ovale was identified by color flow Doppler.  ------------------------------------------------------------------- Right ventricle:  The cavity size was normal. Wall thickness was normal. Systolic function was normal.  ------------------------------------------------------------------- Pulmonic valve:    Structurally  normal valve.   Cusp separation was normal.  Doppler:  Transvalvular velocity was within the normal range. There was no evidence for stenosis. There was no regurgitation.  ------------------------------------------------------------------- Tricuspid valve:   Structurally normal valve.    Doppler: Transvalvular velocity was within the normal range. There was trivial regurgitation.  ------------------------------------------------------------------- Pulmonary artery:   The main pulmonary artery was normal-sized. Systolic pressure was within the normal range.  ------------------------------------------------------------------- Right atrium:  The atrium was normal in size.  ------------------------------------------------------------------- Pericardium:  There was no pericardial effusion.  ------------------------------------------------------------------- Systemic veins: Inferior vena cava: The vessel was normal in size. The respirophasic diameter changes were in the normal range (>= 50%), consistent with normal central venous pressure.  ------------------------------------------------------------------- Measurements   Left ventricle  Value          Reference  LV ID, ED, PLAX chordal                   43    mm       43 - 52  LV ID, ES, PLAX chordal                   30    mm       23 - 38  LV fx shortening, PLAX chordal            30    %        >=29  LV PW thickness, ED                       10    mm       ---------  IVS/LV PW ratio, ED                       0.8            <=1.3  Stroke volume, 2D                         67    ml       ---------  Stroke volume/bsa, 2D                     37    ml/m^2   ---------  LV e&', lateral                            16.6  cm/s     ---------  LV E/e&', lateral                          6.75           ---------  LV e&', medial                             9.25  cm/s     ---------  LV E/e&', medial                            12.11          ---------  LV e&', average                            12.93 cm/s     ---------  LV E/e&', average                          8.67           ---------    Ventricular septum                        Value          Reference  IVS thickness, ED                         8     mm       ---------    LVOT  Value          Reference  LVOT ID, S                                20    mm       ---------  LVOT area                                 3.14  cm^2     ---------  LVOT peak velocity, S                     99.8  cm/s     ---------  LVOT mean velocity, S                     69.3  cm/s     ---------  LVOT VTI, S                               21.4  cm       ---------    Aortic valve                              Value          Reference  Aortic valve peak velocity, S             380   cm/s     ---------  Aortic valve mean velocity, S             277   cm/s     ---------  Aortic valve VTI, S                       75.1  cm       ---------  Aortic mean gradient, S                   35    mm Hg    ---------  Aortic peak gradient, S                   54    mm Hg    ---------  VTI ratio, LVOT/AV                        0.28           ---------  Aortic valve area, VTI                    0.89  cm^2     ---------  Aortic valve area/bsa, VTI                0.49  cm^2/m^2 ---------  Velocity ratio, peak, LVOT/AV             0.26           ---------  Aortic valve area, peak velocity          0.82  cm^2     ---------  Aortic valve area/bsa, peak               0.45  cm^2/m^2 ---------  velocity  Velocity ratio, mean, LVOT/AV  0.25           ---------  Aortic valve area, mean velocity          0.79  cm^2     ---------  Aortic valve area/bsa, mean               0.43  cm^2/m^2 ---------  velocity    Aorta                                     Value          Reference  Aortic root ID, ED                        28    mm       ---------  Ascending  aorta ID, A-P, S                36    mm       ---------    Left atrium                               Value          Reference  LA ID, A-P, ES                            30    mm       ---------  LA ID/bsa, A-P                            1.65  cm/m^2   <=2.2  LA volume, S                              31.6  ml       ---------  LA volume/bsa, S                          17.4  ml/m^2   ---------  LA volume, ES, 1-p A4C                    24.7  ml       ---------  LA volume/bsa, ES, 1-p A4C                13.6  ml/m^2   ---------  LA volume, ES, 1-p A2C                    38.7  ml       ---------  LA volume/bsa, ES, 1-p A2C                21.3  ml/m^2   ---------    Mitral valve                              Value          Reference  Mitral E-wave peak velocity               112   cm/s     ---------  Mitral A-wave peak velocity  82.9  cm/s     ---------  Mitral deceleration time                  222   ms       150 - 230  Mitral pressure half-time                 65    ms       ---------  Mitral peak gradient, D                   5     mm Hg    ---------  Mitral E/A ratio, peak                    1.4            ---------  Mitral valve area, PHT, DP                3.38  cm^2     ---------  Mitral valve area/bsa, PHT, DP            1.86  cm^2/m^2 ---------    Pulmonary arteries                        Value          Reference  PA pressure, S, DP                (H)     40    mm Hg    <=30    Tricuspid valve                           Value          Reference  Tricuspid regurg peak velocity            281   cm/s     ---------  Tricuspid peak RV-RA gradient             32    mm Hg    ---------    Systemic veins                            Value          Reference  Estimated CVP                             8     mm Hg    ---------    Right ventricle                           Value          Reference  TAPSE                                     27.7  mm       ---------  RV pressure, S, DP                 (H)     40    mm Hg    <=30  RV s&', lateral, S  14.4  cm/s     ---------  Legend: (L)  and  (H)  mark values outside specified reference range.  ------------------------------------------------------------------- Prepared and Electronically Authenticated by  Skeet Latch, MD 2018-05-22T17:25:24    Right/Left Heart Cath and Coronary Angiography  Conclusion     Hemodynamic findings consistent with aortic valve stenosis.   JASE HIMMELBERGER is a 41 y.o. female    [id number] LOCATION:  FACILITY: [facility]  PHYSICIAN: Quay Burow, M.D. 24-Apr-1976   DATE OF PROCEDURE:  11/18/2016  DATE OF DISCHARGE:     CARDIAC CATHETERIZATION     History obtained from chart review.Mrs. Garmon is a very pleasant 41 year old mildly overweight married Caucasian female mother of 3 children and is accompanied by her son [name] today. She saw Dr. Martinique in the remote past (2011). I last saw her in the office 02/14/15. There is a history of a murmur from childhood with what she describes as a bicuspid aortic valve. Dr. Martinique said that she would need this addressed at some point in the future. She has no other cardiovascular risk factors. She has noticed increasing dyspnea on exertion when walking up stairs and fatigue with some chest heaviness. She has had no problems with her deliveries. There is no family history of bicuspid aortic valve sclerosis. Since I saw her last she's gained approximately 15 pounds. She does complain of increasing dyspnea on exertion and some chest burning which she attributes to repeat reflux. A 2-D echocardiogram performed 10/29/16 revealed normal LV function with progression of her aortic stenosis now in the moderate range. Gradient was 54 mmHg with a mean of 35 and a valve area 0.89 cm. She recently was at [facility] and when trying to keep up with her children and husband noticed increasing dyspnea and chest.  Burning rating to her back. Based on this we decided to proceed with outpatient right and left heart cath to define her anatomy and physiology.    IMPRESSION: Mrs. Wnuk has normal coronary arteries, mild aortic stenosis and a dilated aortic root. She'll need CT angiography of her aorta. At this point, I do not think that her aortic valve is tight enough to require intervention. I performed right common femoral angiogram and MYNX closed right common femoral puncture site. She will be discharged home today as an outpatient and I will see her back in 2-3 weeks for follow-up.  Quay Burow. MD, [facility] 11/18/2016 4:00 PM      Indications   Aortic stenosis with bicuspid valve [Q23.0, Q23.1 (ICD-10-CM)]  Procedural Details/Technique   Technical Details PROCEDURE DESCRIPTION:   The patient was brought to the second floor [place] [facility]iac cath lab in the postabsorptive state. She was premedicated with Valium 5 mg by mouth, IV Versed and fentanyl. Her right groin was prepped and shaved in usual sterile fashion. Xylocaine 1% was used  for local anesthesia. A 6 French sheath was inserted into the right common femoral artery using standard Seldinger technique. A 7 French sheath was inserted into the right common femoral vein. A 7 French balloon tip thermal dilution Swan-Ganz catheter was then passed through the right heart chambers obtaining sequential blood samples and pressures for the determination of Fick and thermodilution cardiac output. 5 French right and left just enough the catheters along with a 5 French pigtail catheter were used for selective coronary angiography and supravalvular aortography. Isovue dye was used for the entirety of the case. Retrograde aorta, left ventricular and pullback pressures were recorded.   Estimated  blood loss <50 mL.  During this procedure the patient was administered the following to achieve and maintain moderate conscious sedation: Versed 1 mg,  Fentanyl 25 mcg, while the patient's heart rate, blood pressure, and oxygen saturation were continuously monitored. The period of conscious sedation was 41 minutes, of which I was present face-to-face 100% of this time.    Coronary Findings   Dominance: Co-dominant  Right Heart   Right Heart Pressures Hemodynamic findings consistent with aortic valve stenosis. Right atrial pressure-9/8 Right ventricular pressure-30/1 Pulmonary artery pressure-26/11, mean 19 Pulmonary wedge pressure-19/17, mean 11 Cardiac output 6.02 L by Fick, 6.89 L by thermodilution Aortic valve gradient-19.2 mm Aortic valve area-1.47 cm    Coronary Diagrams   Diagnostic Diagram       Implants     Vascular Products  Device Closure Mynxgrip 6/87f - [id number] - Implanted    Inventory item: Device Closure Mynxgrip 6/7f Model/Cat number: [id number]  Manufacturer: [employer] Lot number: [id number]  Device identifier: [id number] Device identifier type: GS1  GUDID Information   Request status Successful    Brand name: [facility] Version/Model: [id number]  Company name: [employer] MRI safety info as of 11/18/16: Labeling does not contain MRI Safety Information  Contains dry or latex rubber: No    GMDN P.T. name: Wound hydrogel dressing, sterile    As of 11/18/2016   Status: Implanted      PACS Images   Show images for Cardiac catheterization   Link to Procedure Log   Procedure Log    Hemo Data    Most Recent Value  Fick Cardiac Output 6.02 L/min  Fick Cardiac Output Index 3.33 (L/min)/BSA  Thermal Cardiac Output 6.89 L/min  Thermal Cardiac Output Index 3.81 (L/min)/BSA  Aortic Mean Gradient 19.2 mmHg  Aortic Peak Gradient 22 mmHg  Aortic Valve Area 1.47  Aortic Value Area Index 0.81 cm2/BSA  RA A Wave 9 mmHg  RA V Wave 8 mmHg  RA Mean 5 mmHg  RV Systolic Pressure 30 mmHg  RV Diastolic Pressure 1 mmHg  RV EDP 8 mmHg  PA Systolic Pressure 26 mmHg  PA Diastolic Pressure 11 mmHg  PA  Mean 19 mmHg  PW A Wave 19 mmHg  PW V Wave 17 mmHg  PW Mean 11 mmHg  AO Systolic Pressure 132 mmHg  AO Diastolic Pressure 70 mmHg  AO Mean 91 mmHg  LV Systolic Pressure 440 mmHg  LV Diastolic Pressure 5 mmHg  LV EDP 16 mmHg  Arterial Occlusion Pressure Extended Systolic Pressure 102 mmHg  Arterial Occlusion Pressure Extended Diastolic Pressure 76 mmHg  Arterial Occlusion Pressure Extended Mean Pressure 99 mmHg  Left Ventricular Apex Extended Systolic Pressure 725 mmHg  Left Ventricular Apex Extended Diastolic Pressure 6 mmHg  Left Ventricular Apex Extended EDP Pressure 16 mmHg  Right Femoral Artery Extended Systolic Pressure 366 mmHg  Right Femoral Artery Extended Diastolic Pressure 70 mmHg  Right Femoral Artery Extended Mean Pressure 93 mmHg  TPVR Index 4.99 HRUI  TSVR Index 23.9 HRUI  PVR SVR Ratio 0.09  TPVR/TSVR Ratio 0.21     Impression:  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 She presents with a long history of symptoms of chest discomfort both with activity and occasionally at rest.  She also reports some exertional shortness of breath, particularly with more strenuous physical exertion. Symptoms seem to be related to and/or exacerbated by the presence of chronic anxiety. I personally reviewed the patient's most recent transthoracic echocardiogram, diagnostic cardiac catheterization,  and transesophageal echocardiogram.  The patient has a functionally bicuspid aortic valve (Sievers type I) with mild to moderately thickened leaflets and mild to moderate restriction of leaflet mobility. There is no significant calcification. The severity of aortic stenosis is underwhelming both by transvalvular gradient measured at recent catheterization and by direct inspection of the valve with planimetry during transesophageal echocardiogram. Transvalvular gradient is affected somewhat by the fact that the patient's aortic annulus and aortic root is relatively small in  diameter.  Based upon these findings I feel that proceeding with aortic valve replacement at this juncture would be premature. The aortic stenosis may have gotten a bit worse over the last few years, but at this point the patient still does not have severe aortic stenosis by any criteria.  Exercise stress testing could be considered, but I suspect that at least some of the patient's symptoms are exacerbated by the presence of chronic anxiety.  At this point I recommend continued close observation.    Plan:  The patient was counseled at length regarding treatment alternatives for management of aortic stenosis including continued medical therapy versus proceeding with aortic valve replacement in the near future.  The natural history of aortic stenosis was reviewed, as were specific diagnostic criteria for surgical intervention. We directly reviewed images from the patient's recent transesophageal echocardiogram and compared them with images from other patients with severe aortic stenosis.  The patient seems to be reassured regarding these findings but also understands that at some point she will need surgical intervention. She understands why it might be a mistake to proceed with surgery earlier than necessary, given the variety of surgical alternatives and long term prognosis following valve replacement.  All of her questions have been addressed. She will continue to follow-up closely with Dr. Gwenlyn Found. I've offered her the opportunity to return to our office for follow-up in one year to review the results of her most recent echocardiogram.   I spent in excess of 90 minutes during the conduct of this office consultation and >50% of this time involved direct face-to-face encounter with the patient for counseling and/or coordination of their care.    Valentina Gu. Roxy Manns, MD 01/30/2017 12:31 PM

## 2017-01-30 NOTE — Patient Instructions (Addendum)
Continue all previous medications without any changes at this time  Endocarditis is a potentially serious infection of heart valves or inside lining of the heart.  It occurs more commonly in patients with diseased heart valves (such as patient's with aortic or mitral valve disease) and in patients who have undergone heart valve repair or replacement.  Certain surgical and dental procedures may put you at risk, such as dental cleaning, other dental procedures, or any surgery involving the respiratory, urinary, gastrointestinal tract, gallbladder or prostate gland.   To minimize your chances for develooping endocarditis, maintain good oral health and seek prompt medical attention for any infections involving the mouth, teeth, gums, skin or urinary tract.    Always notify your doctor or dentist about your underlying heart valve condition before having any invasive procedures.

## 2017-02-03 ENCOUNTER — Encounter: Payer: BLUE CROSS/BLUE SHIELD | Admitting: Thoracic Surgery (Cardiothoracic Vascular Surgery)

## 2017-02-04 ENCOUNTER — Telehealth: Payer: Self-pay

## 2017-02-04 NOTE — Telephone Encounter (Signed)
Called Pt and informed of her recent lab results. Pt seemed pleased with results and will follow up at her scheduled appointment time.

## 2017-02-04 NOTE — Telephone Encounter (Signed)
-----   Message from Shelda Pal, DO sent at 01/29/2017  6:36 PM EDT ----- Looks like Potassium came down nicely. Please let pt know her recent lab draw has normalized. Follow up as originally scheduled. TY.

## 2017-03-14 ENCOUNTER — Ambulatory Visit: Payer: BLUE CROSS/BLUE SHIELD | Admitting: Cardiovascular Disease

## 2017-04-15 ENCOUNTER — Ambulatory Visit: Payer: BLUE CROSS/BLUE SHIELD | Admitting: Cardiovascular Disease

## 2017-04-15 ENCOUNTER — Encounter: Payer: Self-pay | Admitting: Cardiovascular Disease

## 2017-04-15 VITALS — BP 122/86 | HR 75 | Ht 66.5 in | Wt 160.4 lb

## 2017-04-15 DIAGNOSIS — I712 Thoracic aortic aneurysm, without rupture: Secondary | ICD-10-CM | POA: Diagnosis not present

## 2017-04-15 DIAGNOSIS — Q231 Congenital insufficiency of aortic valve: Secondary | ICD-10-CM | POA: Diagnosis not present

## 2017-04-15 DIAGNOSIS — I7121 Aneurysm of the ascending aorta, without rupture: Secondary | ICD-10-CM

## 2017-04-15 DIAGNOSIS — R0609 Other forms of dyspnea: Secondary | ICD-10-CM

## 2017-04-15 DIAGNOSIS — Q230 Congenital stenosis of aortic valve: Secondary | ICD-10-CM | POA: Diagnosis not present

## 2017-04-15 DIAGNOSIS — I350 Nonrheumatic aortic (valve) stenosis: Secondary | ICD-10-CM

## 2017-04-15 NOTE — Assessment & Plan Note (Signed)
History of moderate bicuspid aortic stenosis by transesophageal echo performed and interpreted by Dr. Sallyanne Kuster. Her valve area measured 1.2 cm with a peak gradient of 36 mmHg. Her descending aorta was normal in size. I did refer her to Dr. Roxy Manns for surgical consultation and felt that she was not at a point where she required aortic valve replacement. We will continue to follow her by duplex ultrasound on annual basis.

## 2017-04-15 NOTE — Assessment & Plan Note (Signed)
History of dyspnea on exertion is likely related to her moderate aortic stenosis.

## 2017-04-15 NOTE — Patient Instructions (Signed)
Medication Instructions: Your physician recommends that you continue on your current medications as directed. Please refer to the Current Medication list given to you today.   Testing/Procedures: Your physician has requested that you have an echocardiogram in 1 year. Echocardiography is a painless test that uses sound waves to create images of your heart. It provides your doctor with information about the size and shape of your heart and how well your heart's chambers and valves are working. This procedure takes approximately one hour. There are no restrictions for this procedure.  Follow-Up: We request that you follow-up in: 6 months with an extender and in 12 months with Dr Berry  You will receive a reminder letter in the mail two months in advance. If you don't receive a letter, please call our office to schedule the follow-up appointment.  If you need a refill on your cardiac medications before your next appointment, please call your pharmacy.  

## 2017-04-15 NOTE — Progress Notes (Signed)
04/15/2017 Charlene Carey   08-11-1975  [id number]  Primary Physician Charlene Alar, NP Primary Cardiologist: Lorretta Harp MD Charlene Carey, [place]  HPI:  Charlene Carey is a 41 y.o.  mildly overweight married Caucasian female mother of 3 children . She saw Dr. Martinique in the remote past (2011). I last saw her in the office  12/13/16. There is a history of a murmur from childhood with what she describes as a bicuspid aortic valve. Dr. Martinique said that she would need this addressed at some point in the future. She has no other cardiovascular risk factors. She has noticed increasing dyspnea on exertion when walking up stairs and fatigue with some chest heaviness. She has had no problems with her deliveries. There is no family history of bicuspid aortic valve sclerosis. Since I saw her last she's gained approximately 15 pounds. She does complain of increasing dyspnea on exertion and some chest burning which she attributes to repeat reflux. A 2-D echocardiogram performed 10/29/16 revealed normal LV function with progression of her aortic stenosis now in the moderate range. Gradient was 54 mmHg with a mean of 35 and a valve area 0.89 cm. She recently was at [facility] and when trying to keep up with her children and husband noticed increasing dyspnea and chest. Burning rating to her back. Based on this we decided to proceed with outpatient right and left heart cath to define her anatomy and physiology on 11/18/16 revealing normal coronary arteries with only mild aortic stenosis. Her valve area calculated at 1.4 cm with a peak gradient of 20 mmHg. There is clearly a discrepancy between her transthoracic echo and her invasive hemodynamic measurements. I referred her to Dr. Sallyanne Carey for a transesophageal echo which was performed 01/27/17 confirming moderate aortic stenosis with a bicuspid aortic valve and a valve area of 1.3 cm with a peak aortic gradient of 36 mmHg. Her descending  aorta was normal in size. I then referred her to Dr. Roxy Carey for surgical evaluation/consultation felt that she was not in a point where she required aortic valve replacement.    No outpatient medications have been marked as taking for the 04/15/17 encounter (Office Visit) with Lorretta Harp, MD.     Allergies  Allergen Reactions  . Codeine Nausea Only    Has withdrawal when trying to stop med    Social History   Socioeconomic History  . Marital status: Married    Spouse name: Not on file  . Number of children: Not on file  . Years of education: Not on file  . Highest education level: Not on file  Social Needs  . Financial resource strain: Not on file  . Food insecurity - worry: Not on file  . Food insecurity - inability: Not on file  . Transportation needs - medical: Not on file  . Transportation needs - non-medical: Not on file  Occupational History  . Not on file  Tobacco Use  . Smoking status: Former Research scientist (life sciences)  . Smokeless tobacco: Never Used  Substance and Sexual Activity  . Alcohol use: Yes    Alcohol/week: 0.6 oz    Types: 1 Standard drinks or equivalent per week    Comment: depends on day   . Drug use: No  . Sexual activity: Not on file  Other Topics Concern  . Not on file  Social History Narrative   Homeschools    Charlene Carey- born in 1999   Charlene Carey- 2011 (  from second marriage)   She is a stay at home mom   No pets   Completed 2 years of college    married (second marriage)   Enjoys baking, gardening, herb garden     Review of Systems: General: negative for chills, fever, night sweats or weight changes.  Cardiovascular: negative for chest pain, dyspnea on exertion, edema, orthopnea, palpitations, paroxysmal nocturnal dyspnea or shortness of breath Dermatological: negative for rash Respiratory: negative for cough or wheezing Urologic: negative for hematuria Abdominal: negative for nausea, vomiting, diarrhea, bright red blood per rectum, melena, or  hematemesis Neurologic: negative for visual changes, syncope, or dizziness All other systems reviewed and are otherwise negative except as noted above.    Blood pressure 122/86, pulse 75, height 5' 6.5" (1.689 m), weight 160 lb 6.4 oz (72.8 kg).  General appearance: alert and no distress Neck: no adenopathy, no JVD, supple, symmetrical, trachea midline, thyroid not enlarged, symmetric, no tenderness/mass/nodules and Bilateral carotid bruits versus transmitted murmur Lungs: clear to auscultation bilaterally Heart: 2/6 systolic ejection murmur at the base consistent with aortic stenosis Extremities: extremities normal, atraumatic, no cyanosis or edema Pulses: 2+ and symmetric Skin: Skin color, texture, turgor normal. No rashes or lesions Neurologic: Alert and oriented X 3, normal strength and tone. Normal symmetric reflexes. Normal coordination and gait  EKG sinus rhythm at 75 without ST or T-wave changes. I personally reviewed this EKG.  ASSESSMENT AND PLAN:   Aortic stenosis due to bicuspid aortic valve History of moderate bicuspid aortic stenosis by transesophageal echo performed and interpreted by Dr. Sallyanne Carey. Her valve area measured 1.2 cm with a peak gradient of 36 mmHg. Her descending aorta was normal in size. I did refer her to Dr. Roxy Carey for surgical consultation and felt that she was not at a point where she required aortic valve replacement. We will continue to follow her by duplex ultrasound on annual basis.  Dyspnea on exertion History of dyspnea on exertion is likely related to her moderate aortic stenosis.      Lorretta Harp MD FACP,FACC,FAHA, [facility] 04/15/2017 11:49 AM

## 2017-04-16 NOTE — Addendum Note (Signed)
Addended by: Zebedee Iba on: 04/16/2017 01:44 PM   Modules accepted: Orders

## 2017-06-22 ENCOUNTER — Other Ambulatory Visit: Payer: Self-pay | Admitting: Endocrinology

## 2017-06-22 DIAGNOSIS — E042 Nontoxic multinodular goiter: Secondary | ICD-10-CM

## 2017-07-08 ENCOUNTER — Other Ambulatory Visit: Payer: BLUE CROSS/BLUE SHIELD

## 2017-07-11 ENCOUNTER — Ambulatory Visit: Payer: BLUE CROSS/BLUE SHIELD | Admitting: Endocrinology

## 2017-07-11 DIAGNOSIS — Z0289 Encounter for other administrative examinations: Secondary | ICD-10-CM

## 2017-07-18 ENCOUNTER — Ambulatory Visit: Payer: BLUE CROSS/BLUE SHIELD | Admitting: Family

## 2017-08-18 ENCOUNTER — Encounter: Payer: Self-pay | Admitting: Family

## 2017-08-18 ENCOUNTER — Ambulatory Visit: Payer: BLUE CROSS/BLUE SHIELD | Admitting: Family

## 2017-08-18 VITALS — BP 135/73 | HR 87 | Resp 16 | Ht 67.0 in | Wt 162.4 lb

## 2017-08-18 DIAGNOSIS — I350 Nonrheumatic aortic (valve) stenosis: Secondary | ICD-10-CM | POA: Diagnosis not present

## 2017-08-18 DIAGNOSIS — E041 Nontoxic single thyroid nodule: Secondary | ICD-10-CM

## 2017-08-18 DIAGNOSIS — F419 Anxiety disorder, unspecified: Secondary | ICD-10-CM

## 2017-08-18 DIAGNOSIS — E042 Nontoxic multinodular goiter: Secondary | ICD-10-CM | POA: Diagnosis not present

## 2017-08-18 DIAGNOSIS — E875 Hyperkalemia: Secondary | ICD-10-CM | POA: Diagnosis not present

## 2017-08-18 NOTE — Progress Notes (Signed)
Subjective:    Patient ID: Charlene Carey, female    DOB: 23-Jan-1976, 42 y.o.   MRN: [id number]  HPI  Patient is a 42 yr old female who presents today for follow up.  Anxiety-  Reports well controlled. Not currently seeing a counselor.  She is talking to a friend at church and did a bible study at church  AS- following with cardiology. (Dr. Gwenlyn Found). Also saw Dr. Roxy Manns for surgical consult an dit was felt that she was not yet at the point of needing AVR.  They plan to follow her with annual Echo's. Reports some baseline SOB which is stable.   Hx of thyroid nodules- saw Dr. Dwyane Dee 8/18.  He recommended a 6 month follow up.  Lab Results  Component Value Date   TSH 0.38 11/13/2016   Vit D deficiency-  Wt Readings from Last 3 Encounters:  08/18/17 162 lb 6.4 oz (73.7 kg)  04/15/17 160 lb 6.4 oz (72.8 kg)  01/30/17 157 lb (71.2 kg)    Review of Systems    see HPI  Past Medical History:  Diagnosis Date  . Allergy-induced asthma   . Aortic stenosis with bicuspid valve   . Ascending aortic aneurysm ([place])   . Frequent headaches   . Heartburn   . History of chicken pox   . History of gestational diabetes   . Multinodular goiter   . Seasonal allergies      Social History   Socioeconomic History  . Marital status: Married    Spouse name: Not on file  . Number of children: Not on file  . Years of education: Not on file  . Highest education level: Not on file  Social Needs  . Financial resource strain: Not on file  . Food insecurity - worry: Not on file  . Food insecurity - inability: Not on file  . Transportation needs - medical: Not on file  . Transportation needs - non-medical: Not on file  Occupational History  . Not on file  Tobacco Use  . Smoking status: Former Research scientist (life sciences)  . Smokeless tobacco: Never Used  Substance and Sexual Activity  . Alcohol use: Yes    Alcohol/week: 0.6 oz    Types: 1 Standard drinks or equivalent per week    Comment: depends on day   .  Drug use: No  . Sexual activity: Not on file  Other Topics Concern  . Not on file  Social History Narrative   Homeschools    [name]- born in 1997   [name]-2001   [name]- 2011 (from second marriage)   She is a stay at home mom   No pets   Completed 2 years of college    married (second marriage)   Enjoys Control and instrumentation engineer, gardening, herb garden    Past Surgical History:  Procedure Laterality Date  . BREAST REDUCTION SURGERY    . OTHER SURGICAL HISTORY     had d& c  . RIGHT/LEFT HEART CATH AND CORONARY ANGIOGRAPHY N/A 11/18/2016   Procedure: Right/Left Heart Cath and Coronary Angiography;  Surgeon: Lorretta Harp, MD;  Location: [place] [facility];  Service: Cardiovascular;  Laterality: N/A;  . TEE WITHOUT CARDIOVERSION N/A 01/27/2017   Procedure: TRANSESOPHAGEAL ECHOCARDIOGRAM (TEE);  Surgeon: Sanda Klein, MD;  Location: [facility] ENDOSCOPY;  Service: Cardiovascular;  Laterality: N/A;    Family History  Problem Relation Age of Onset  . Hypertension Mother   . Thyroid disease Mother        Hypothyroid  .  Hypertension Maternal Grandfather   . Stroke Maternal Grandfather   . Hyperlipidemia Maternal Grandfather   . Diabetes Father     Allergies  Allergen Reactions  . Codeine Nausea Only    Has withdrawal when trying to stop med    No current outpatient medications on file prior to visit.   No current facility-administered medications on file prior to visit.     BP 135/73 (BP Location: Left Arm, Patient Position: Sitting, Cuff Size: Normal)   Pulse 87   Resp 16   Ht 5\' 7"  (1.702 m)   Wt 162 lb 6.4 oz (73.7 kg)   SpO2 100%   BMI 25.44 kg/m    Objective:   Physical Exam  Constitutional: She is oriented to person, place, and time. She appears well-developed and well-nourished.  Cardiovascular: Normal rate and regular rhythm.  Murmur heard. Pulmonary/Chest: Effort normal and breath sounds normal. No respiratory distress. She has no wheezes.  Neurological: She is alert and  oriented to person, place, and time.  Psychiatric: She has a normal mood and affect. Her behavior is normal. Judgment and thought content normal.          Assessment & Plan:  Hx hyperkalemia- check follow up bmet. Advised her to use moderation in eating high potassium content foods.  AS- management per cardiology/surgery.  Anxiety- stable off of meds. She plans to seek some church counseling.   Thyroid nodules- advised pt to schedule follow up with endocrinology.

## 2017-08-18 NOTE — Patient Instructions (Addendum)
  Try to avoid overeating the following high potassium foods- use moderation.   High potassium content foods  Highest content (>25 meq/100 g) High content (>6.2 meq/100 g)   Vegetables   Spinach   Tomatoes   Broccoli   Winter squash   Beets   Carrots   Cauliflower   Potatoes   Fruits   Bananas  Dried figs Cantaloupe  Molasses Kiwis  Seaweed Oranges  Very high content (>12.5 meq/100 g) Mangos  Dried fruits (dates, prunes) Meats  Nuts Ground beef  Avocados Steak  Bran cereals Pork  Wheat germ Veal  Lima beans Arline Asp

## 2017-08-19 ENCOUNTER — Encounter: Payer: Self-pay | Admitting: Family

## 2017-08-19 LAB — BASIC METABOLIC PANEL
BUN: 9 mg/dL (ref 6–23)
CALCIUM: 9.9 mg/dL (ref 8.4–10.5)
CO2: 28 mEq/L (ref 19–32)
Chloride: 105 mEq/L (ref 96–112)
Creatinine, Ser: 0.81 mg/dL (ref 0.40–1.20)
GFR: 81.61 mL/min (ref >60.00)
Glucose, Bld: 99 mg/dL (ref 70–99)
Potassium: 4.9 mEq/L (ref 3.5–5.1)
SODIUM: 141 meq/L (ref 135–145)

## 2018-02-02 ENCOUNTER — Ambulatory Visit: Payer: BLUE CROSS/BLUE SHIELD | Admitting: Thoracic Surgery (Cardiothoracic Vascular Surgery)

## 2018-02-18 ENCOUNTER — Encounter: Payer: BLUE CROSS/BLUE SHIELD | Admitting: Family

## 2018-03-09 ENCOUNTER — Ambulatory Visit: Payer: BLUE CROSS/BLUE SHIELD | Admitting: Thoracic Surgery (Cardiothoracic Vascular Surgery)

## 2018-05-04 DIAGNOSIS — Z01419 Encounter for gynecological examination (general) (routine) without abnormal findings: Secondary | ICD-10-CM | POA: Diagnosis not present

## 2018-05-04 DIAGNOSIS — Z6825 Body mass index (BMI) 25.0-25.9, adult: Secondary | ICD-10-CM | POA: Diagnosis not present

## 2018-05-04 DIAGNOSIS — Z1151 Encounter for screening for human papillomavirus (HPV): Secondary | ICD-10-CM | POA: Diagnosis not present

## 2018-05-19 DIAGNOSIS — Z803 Family history of malignant neoplasm of breast: Secondary | ICD-10-CM | POA: Diagnosis not present

## 2018-05-19 DIAGNOSIS — N6321 Unspecified lump in the left breast, upper outer quadrant: Secondary | ICD-10-CM | POA: Diagnosis not present

## 2018-07-24 DIAGNOSIS — M25511 Pain in right shoulder: Secondary | ICD-10-CM | POA: Diagnosis not present

## 2018-07-26 ENCOUNTER — Emergency Department
Admission: EM | Admit: 2018-07-26 | Discharge: 2018-07-26 | Disposition: A | Discharge status: Home / Self Care | Payer: BLUE CROSS/BLUE SHIELD | Source: Home / Self Care | Attending: Family Medicine | Admitting: Family Medicine

## 2018-07-26 ENCOUNTER — Encounter: Payer: Self-pay | Admitting: Emergency Medicine

## 2018-07-26 ENCOUNTER — Other Ambulatory Visit: Payer: Self-pay

## 2018-07-26 DIAGNOSIS — Z711 Person with feared health complaint in whom no diagnosis is made: Secondary | ICD-10-CM | POA: Diagnosis not present

## 2018-07-26 MED ORDER — HYDROCORTISONE-ACETIC ACID 1-2 % OT SOLN: OTIC | 0 refills | Status: DC

## 2018-07-26 NOTE — ED Triage Notes (Signed)
Used q-tip on ears this morning and does not know if cotton remained in ear; no pain.

## 2018-07-26 NOTE — ED Provider Notes (Signed)
[facility]    CSN: [id number] Arrival date & time: 07/26/18  1801     History   Chief Complaint Chief Complaint  Patient presents with  . Ear Problem    HPI Charlene Carey is a 43 y.o. female.   Patient complains of occasionally pruritic ear canals.  She used Q-tips in her ears this morning, and is concerned that cotton may still be in an ear canal.  She denies ear pain or muffled hearing.  The history is provided by the patient.    Past Medical History:  Diagnosis Date  . Allergy-induced asthma   . Aortic stenosis with bicuspid valve   . Ascending aortic aneurysm ([place])   . Frequent headaches   . Heartburn   . History of chicken pox   . History of gestational diabetes   . Multinodular goiter   . Seasonal allergies     Patient Active Problem List   Diagnosis Date Noted  . Ascending aortic aneurysm ([place])   . Multinodular goiter   . Atypical chest pain 10/16/2016  . Dyspnea on exertion 10/16/2016  . Aortic stenosis due to bicuspid aortic valve     Past Surgical History:  Procedure Laterality Date  . BREAST REDUCTION SURGERY    . OTHER SURGICAL HISTORY     had d& c  . RIGHT/LEFT HEART CATH AND CORONARY ANGIOGRAPHY N/A 11/18/2016   Procedure: Right/Left Heart Cath and Coronary Angiography;  Surgeon: Lorretta Harp, MD;  Location: [place] [facility];  Service: Cardiovascular;  Laterality: N/A;  . TEE WITHOUT CARDIOVERSION N/A 01/27/2017   Procedure: TRANSESOPHAGEAL ECHOCARDIOGRAM (TEE);  Surgeon: Sanda Klein, MD;  [facility] ENDOSCOPY;  Service: Cardiovascular;  Laterality: N/A;    OB History   No obstetric history on file.      Home Medications    Prior to Admission medications   Medication Sig Start Date End Date Taking? Authorizing Provider  acetic acid-hydrocortisone (VOSOL-HC) OTIC solution Apply 2 to 3 gtts in affected ear once daily for 2 to 3 days 07/26/18   Kandra Nicolas, MD    Family History Family History  Problem  Relation Age of Onset  . Hypertension Mother   . Thyroid disease Mother        Hypothyroid  . Hypertension Maternal Grandfather   . Stroke Maternal Grandfather   . Hyperlipidemia Maternal Grandfather   . Diabetes Father     Social History Social History   Tobacco Use  . Smoking status: Former Research scientist (life sciences)  . Smokeless tobacco: Never Used  Substance Use Topics  . Alcohol use: Yes    Alcohol/week: 1.0 standard drinks    Types: 1 Standard drinks or equivalent per week    Comment: depends on day   . Drug use: No     Allergies   Codeine   Review of Systems Review of Systems  Constitutional: Negative.   HENT: Negative for ear discharge, ear pain and tinnitus.   All other systems reviewed and are negative.    Physical Exam Triage Vital Signs ED Triage Vitals  Enc Vitals Group     BP 07/26/18 1825 131/77     Pulse Rate 07/26/18 1825 80     Resp 07/26/18 1825 16     Temp 07/26/18 1825 97.8 F (36.6 C)     Temp Source 07/26/18 1825 Oral     SpO2 07/26/18 1825 99 %     Weight 07/26/18 1826 160 lb (72.6 kg)  Height 07/26/18 1826 5\' 7"  (1.702 m)     Head Circumference --      Peak Flow --      Pain Score 07/26/18 1826 0     Pain Loc --      Pain Edu? --      Excl. in [place]? --    No data found.  Updated Vital Signs BP 131/77 (BP Location: Right Arm)   Pulse 80   Temp 97.8 F (36.6 C) (Oral)   Resp 16   Ht 5\' 7"  (1.702 m)   Wt 72.6 kg   SpO2 99%   BMI 25.06 kg/m   Visual Acuity Right Eye Distance:   Left Eye Distance:   Bilateral Distance:    Right Eye Near:   Left Eye Near:    Bilateral Near:     Physical Exam Nursing notes and Vital Signs reviewed. Appearance:  Patient appears stated age, and in no acute distress Eyes:  Pupils are equal, round, and reactive to light and accomodation.  Extraocular movement is intact.  Conjunctivae are not inflamed  Ears:  Canals normal.  No foreign body present in either canal.  Tympanic membranes normal.  Nose:    Normal turbinates.   Pharynx:  Normal Neck:  Supple. No adenopathy Lungs:  Normal respiration.  Heart:  Normal rate  UC Treatments / Results  Labs (all labs ordered are listed, but only abnormal results are displayed) Labs Reviewed - No data to display  EKG None  Radiology No results found.  Procedures Procedures (including critical care time)  Medications Ordered in UC Medications - No data to display  Initial Impression / Assessment and Plan / UC Course  I have reviewed the triage vital signs and the nursing notes.  Pertinent labs & imaging results that were available during my care of the patient were reviewed by me and considered in my medical decision making (see chart for details).    Ear canals and tympanic membranes normal; no foreign bodies present. For pruritic ear canals, [facility]   Final Clinical Impressions(s) / UC Diagnoses   Final diagnoses:  Feared condition not demonstrated   Discharge Instructions   None    ED Prescriptions    Medication Sig Dispense Auth. Provider   acetic acid-hydrocortisone (VOSOL-HC) OTIC solution Apply 2 to 3 gtts in affected ear once daily for 2 to 3 days 10 mL Kandra Nicolas, MD         Kandra Nicolas, MD 07/29/18 1013

## 2018-08-03 DIAGNOSIS — M25511 Pain in right shoulder: Secondary | ICD-10-CM | POA: Diagnosis not present

## 2018-08-05 DIAGNOSIS — M25511 Pain in right shoulder: Secondary | ICD-10-CM | POA: Diagnosis not present

## 2018-12-06 ENCOUNTER — Emergency Department (INDEPENDENT_AMBULATORY_CARE_PROVIDER_SITE_OTHER)
Admission: EM | Admit: 2018-12-06 | Discharge: 2018-12-06 | Disposition: A | Discharge status: Home / Self Care | Payer: BLUE CROSS/BLUE SHIELD | Source: Home / Self Care

## 2018-12-06 ENCOUNTER — Other Ambulatory Visit: Payer: Self-pay

## 2018-12-06 ENCOUNTER — Encounter: Payer: Self-pay | Admitting: Emergency Medicine

## 2018-12-06 DIAGNOSIS — H61892 Other specified disorders of left external ear: Secondary | ICD-10-CM

## 2018-12-06 NOTE — ED Triage Notes (Signed)
Patient was outside yesterday and felt a bug fly into her left ear, not sure if it came out or not, no pain.

## 2018-12-06 NOTE — Discharge Instructions (Signed)
°  Your ear exam was normal today.  If you still have the sensation later today or this week, you may try flushing your ear with warm water and peroxide. Make sure the water is warm because cold water can cause temporary dizziness/vertigo. Avoid sticking objects including Q-tips in your ear as that can cause wax to be compacted and can damage the delicate tissue in the ear canal or on the ear drum.  If you develop worsening symptoms, please follow up with your family doctor later this week.

## 2018-12-06 NOTE — ED Provider Notes (Signed)
[facility]    CSN: [id number] Arrival date & time: 12/06/18  1131     History   Chief Complaint Chief Complaint  Patient presents with  . Foreign Body in Left Ear    HPI Charlene Carey is a 43 y.o. female.   HPI  Charlene Carey is a 43 y.o. female presenting to UC with c/o concern a bug flew into her ear this morning [place].  Pt states she felt like something was still in her ear when she got home so she used a Q-tip gently in her ear and saw blood on the tip. Pt is concerned she killed the bug inside her ear. Denies pain or change in hearing but she does have some mild itching/irritation sensation.    Past Medical History:  Diagnosis Date  . Allergy-induced asthma   . Aortic stenosis with bicuspid valve   . Ascending aortic aneurysm ([place])   . Frequent headaches   . Heartburn   . History of chicken pox   . History of gestational diabetes   . Multinodular goiter   . Seasonal allergies     Patient Active Problem List   Diagnosis Date Noted  . Ascending aortic aneurysm ([place])   . Multinodular goiter   . Atypical chest pain 10/16/2016  . Dyspnea on exertion 10/16/2016  . Aortic stenosis due to bicuspid aortic valve     Past Surgical History:  Procedure Laterality Date  . BREAST REDUCTION SURGERY    . OTHER SURGICAL HISTORY     had d& c  . RIGHT/LEFT HEART CATH AND CORONARY ANGIOGRAPHY N/A 11/18/2016   Procedure: Right/Left Heart Cath and Coronary Angiography;  Surgeon: Lorretta Harp, MD;  Location: [facility];  Service: Cardiovascular;  Laterality: N/A;  . TEE WITHOUT CARDIOVERSION N/A 01/27/2017   Procedure: TRANSESOPHAGEAL ECHOCARDIOGRAM (TEE);  Surgeon: Sanda Klein, MD;  Location: [facility] ENDOSCOPY;  Service: Cardiovascular;  Laterality: N/A;    OB History   No obstetric history on file.      Home Medications    Prior to Admission medications   Medication Sig Start Date End Date  Taking? Authorizing Provider  acetic acid-hydrocortisone (VOSOL-HC) OTIC solution Apply 2 to 3 gtts in affected ear once daily for 2 to 3 days 07/26/18   Kandra Nicolas, MD    Family History Family History  Problem Relation Age of Onset  . Hypertension Mother   . Thyroid disease Mother        Hypothyroid  . Hypertension Maternal Grandfather   . Stroke Maternal Grandfather   . Hyperlipidemia Maternal Grandfather   . Diabetes Father     Social History Social History   Tobacco Use  . Smoking status: Former Research scientist (life sciences)  . Smokeless tobacco: Never Used  Substance Use Topics  . Alcohol use: Yes    Alcohol/week: 1.0 standard drinks    Types: 1 Standard drinks or equivalent per week    Comment: depends on day   . Drug use: No     Allergies   Codeine   Review of Systems Review of Systems  Constitutional: Negative for chills and fever.  HENT: Negative for congestion, ear discharge, ear pain and hearing loss.   Neurological: Negative for dizziness.     Physical Exam Triage Vital Signs ED Triage Vitals  Enc Vitals Group     BP 12/06/18 1155 130/85     Pulse Rate 12/06/18 1155 76  Resp --      Temp 12/06/18 1155 98.6 F (37 C)     Temp Source 12/06/18 1155 Oral     SpO2 12/06/18 1155 97 %     Weight 12/06/18 1156 166 lb 8 oz (75.5 kg)     Height 12/06/18 1156 5\' 6"  (1.676 m)     Head Circumference --      Peak Flow --      Pain Score 12/06/18 1156 0     Pain Loc --      Pain Edu? --      Excl. in [place]? --    No data found.  Updated Vital Signs BP 130/85 (BP Location: Right Arm)   Pulse 76   Temp 98.6 F (37 C) (Oral)   Ht 5\' 6"  (1.676 m)   Wt 166 lb 8 oz (75.5 kg)   SpO2 97%   BMI 26.87 kg/m   Visual Acuity Right Eye Distance:   Left Eye Distance:   Bilateral Distance:    Right Eye Near:   Left Eye Near:    Bilateral Near:     Physical Exam Vitals signs and nursing note reviewed.  Constitutional:      Appearance: Normal appearance. She is  well-developed.  HENT:     Head: Normocephalic and atraumatic.     Right Ear: Hearing, tympanic membrane, ear canal and external ear normal.     Left Ear: Hearing, tympanic membrane, ear canal and external ear normal.     Nose: Nose normal.     Mouth/Throat:     Lips: Pink.     Mouth: Mucous membranes are moist.     Pharynx: Oropharynx is clear. Uvula midline.  Neck:     Musculoskeletal: Normal range of motion.  Cardiovascular:     Rate and Rhythm: Normal rate.  Pulmonary:     Effort: Pulmonary effort is normal.  Musculoskeletal: Normal range of motion.  Skin:    General: Skin is warm and dry.  Neurological:     Mental Status: She is alert and oriented to person, place, and time.  Psychiatric:        Behavior: Behavior normal.      UC Treatments / Results  Labs (all labs ordered are listed, but only abnormal results are displayed) Labs Reviewed - No data to display  EKG None  Radiology No results found.  Procedures Procedures (including critical care time)  Medications Ordered in UC Medications - No data to display  Initial Impression / Assessment and Plan / UC Course  I have reviewed the triage vital signs and the nursing notes.  Pertinent labs & imaging results that were available during my care of the patient were reviewed by me and considered in my medical decision making (see chart for details).     Normal ear exam. Reassured pt no bugs, foreign bodies, or even wax in her ears. TM is intact, no erythema. Normal ear canal- no scratches, bleeding or wax. Pt still concerned something may be in her ear. Offered to flush her ears in attempt to stop sensation she is having, pt declined. Encouraged f/u with her PCP if symptoms not improving later this week. AVS provided.    Final Clinical Impressions(s) / UC Diagnoses   Final diagnoses:  Foreign body sensation in ear canal, left     Discharge Instructions      Your ear exam was normal today.  If you  still have the sensation later today or this  week, you may try flushing your ear with warm water and peroxide. Make sure the water is warm because cold water can cause temporary dizziness/vertigo. Avoid sticking objects including Q-tips in your ear as that can cause wax to be compacted and can damage the delicate tissue in the ear canal or on the ear drum.  If you develop worsening symptoms, please follow up with your family doctor later this week.     ED Prescriptions    None     Controlled Substance Prescriptions [place] Controlled Substance Registry consulted? Not Applicable   Tyrell Antonio 12/06/18 1430

## 2019-04-18 ENCOUNTER — Other Ambulatory Visit: Payer: Self-pay

## 2019-04-18 ENCOUNTER — Emergency Department
Admission: EM | Admit: 2019-04-18 | Discharge: 2019-04-18 | Disposition: A | Discharge status: Home / Self Care | Payer: BC Managed Care – PPO | Source: Home / Self Care | Attending: Family Medicine | Admitting: Family Medicine

## 2019-04-18 DIAGNOSIS — R1031 Right lower quadrant pain: Secondary | ICD-10-CM

## 2019-04-18 DIAGNOSIS — G8929 Other chronic pain: Secondary | ICD-10-CM

## 2019-04-18 LAB — POCT URINALYSIS DIP (MANUAL ENTRY)
Bilirubin, UA: NEGATIVE
Blood, UA: NEGATIVE
Glucose, UA: NEGATIVE mg/dL
Ketones, POC UA: NEGATIVE mg/dL
Leukocytes, UA: NEGATIVE
Nitrite, UA: NEGATIVE
Protein Ur, POC: NEGATIVE mg/dL
Spec Grav, UA: 1.025 (ref 1.010–1.025)
Urobilinogen, UA: 0.2 E.U./dL
pH, UA: 5.5 (ref 5.0–8.0)

## 2019-04-18 LAB — POCT URINE PREGNANCY: Preg Test, Ur: NEGATIVE

## 2019-04-18 MED ORDER — CEPHALEXIN 500 MG PO CAPS: 500.0000 mg | ORAL_CAPSULE | Freq: Two times a day (BID) | ORAL | 0 refills | Status: DC

## 2019-04-18 NOTE — Discharge Instructions (Addendum)
Increase fluid intake. Begin cephalexin (Keflex) if urine culture positive. May take Ibuprofen 200mg , 4 tabs every 8 hours with food as needed for lower abdominal and back discomfort.  If symptoms become significantly worse during the night or over the weekend, proceed to the local emergency room.

## 2019-04-18 NOTE — ED Triage Notes (Signed)
Pt here to check for UTI. Pt also concerned that menses is 2 weeks late. Feeling cramps, lower and mid back pain as well as bloated.

## 2019-04-18 NOTE — ED Provider Notes (Signed)
[facility]    CSN: [id number] Arrival date & time: 04/18/19  [id number]      History   Chief Complaint Chief Complaint  Patient presents with  . Urinary Tract Infection    possible    HPI Charlene Carey is a 43 y.o. female.   Patient is concerned that her period is now two weeks late, and she may also have a UTI.  Two weeks ago she developed an increase in her usual bilateral lower back ache that has persisted.  During the past several days she has had lower abdominal cramping and generalized bloating.  She denies fever, urinary symptoms, and vaginal discharge.   She states that she has had several UTI's in the past without urinary symptoms other than vague abdominal discomfort.  The history is provided by the patient.    Past Medical History:  Diagnosis Date  . Allergy-induced asthma   . Aortic stenosis with bicuspid valve   . Ascending aortic aneurysm ([place])   . Frequent headaches   . Heartburn   . History of chicken pox   . History of gestational diabetes   . Multinodular goiter   . Seasonal allergies     Patient Active Problem List   Diagnosis Date Noted  . Ascending aortic aneurysm ([place])   . Multinodular goiter   . Atypical chest pain 10/16/2016  . Dyspnea on exertion 10/16/2016  . Aortic stenosis due to bicuspid aortic valve     Past Surgical History:  Procedure Laterality Date  . BREAST REDUCTION SURGERY    . OTHER SURGICAL HISTORY     had d& c  . RIGHT/LEFT HEART CATH AND CORONARY ANGIOGRAPHY N/A 11/18/2016   Procedure: Right/Left Heart Cath and Coronary Angiography;  Surgeon: Lorretta Harp, MD;  Location: [facility];  Service: Cardiovascular;  Laterality: N/A;  . TEE WITHOUT CARDIOVERSION N/A 01/27/2017   Procedure: TRANSESOPHAGEAL ECHOCARDIOGRAM (TEE);  Surgeon: Sanda Klein, MD;  Location: [facility]COPY;  Service: Cardiovascular;  Laterality: N/A;       Home Medications    Prior to Admission medications   Medication Sig  Start Date End Date Taking? Authorizing Provider  acetic acid-hydrocortisone (VOSOL-HC) OTIC solution Apply 2 to 3 gtts in affected ear once daily for 2 to 3 days 07/26/18   Kandra Nicolas, MD  cephALEXin (KEFLEX) 500 MG capsule Take 1 capsule (500 mg total) by mouth 2 (two) times daily. (Rx void after 04/25/19) 04/18/19   Kandra Nicolas, MD    Family History Family History  Problem Relation Age of Onset  . Hypertension Mother   . Thyroid disease Mother        Hypothyroid  . Hypertension Maternal Grandfather   . Stroke Maternal Grandfather   . Hyperlipidemia Maternal Grandfather   . Diabetes Father     Social History Social History   Tobacco Use  . Smoking status: Former Research scientist (life sciences)  . Smokeless tobacco: Never Used  Substance Use Topics  . Alcohol use: Yes    Alcohol/week: 1.0 standard drinks    Types: 1 Standard drinks or equivalent per week    Comment: depends on day   . Drug use: No     Allergies   Codeine   Review of Systems Review of Systems  Constitutional: Negative for activity change, appetite change, chills, diaphoresis, fatigue and fever.  HENT: Negative.   Eyes: Negative.   Respiratory: Negative.   Cardiovascular: Negative.   Gastrointestinal: Negative for abdominal pain, constipation, diarrhea, nausea  and vomiting.       Abdominal bloating  Genitourinary: Negative for dysuria, flank pain, frequency, genital sores, hematuria, pelvic pain, urgency, vaginal bleeding, vaginal discharge and vaginal pain.  Musculoskeletal: Positive for back pain.  Skin: Negative for rash.  Neurological: Negative for headaches.     Physical Exam Triage Vital Signs ED Triage Vitals  Enc Vitals Group     BP 04/18/19 0846 132/78     Pulse Rate 04/18/19 0846 78     Resp 04/18/19 0846 18     Temp 04/18/19 0846 98.4 F (36.9 C)     Temp Source 04/18/19 0846 Oral     SpO2 04/18/19 0846 99 %     Weight 04/18/19 0847 165 lb (74.8 kg)     Height 04/18/19 0847 5\' 6"  (1.676 m)      Head Circumference --      Peak Flow --      Pain Score 04/18/19 0846 3     Pain Loc --      Pain Edu? --      Excl. in [place]? --    No data found.  Updated Vital Signs BP 132/78 (BP Location: Right Arm)   Pulse 78   Temp 98.4 F (36.9 C) (Oral)   Resp 18   Ht 5\' 6"  (1.676 m)   Wt 74.8 kg   LMP 02/11/2019 (Approximate)   SpO2 99%   BMI 26.63 kg/m   Visual Acuity Right Eye Distance:   Left Eye Distance:   Bilateral Distance:    Right Eye Near:   Left Eye Near:    Bilateral Near:     Physical Exam Vitals signs and nursing note reviewed.  Constitutional:      General: She is not in acute distress. HENT:     Head: Normocephalic.     Right Ear: External ear normal.     Left Ear: External ear normal.     Mouth/Throat:     Pharynx: Oropharynx is clear.  Eyes:     Conjunctiva/sclera: Conjunctivae normal.     Pupils: Pupils are equal, round, and reactive to light.  Neck:     Musculoskeletal: Neck supple.  Cardiovascular:     Heart sounds: Normal heart sounds.  Pulmonary:     Breath sounds: Normal breath sounds.  Abdominal:     General: Abdomen is flat. Bowel sounds are normal.     Palpations: Abdomen is soft. There is no hepatomegaly, splenomegaly or mass.     Tenderness: There is abdominal tenderness in the right lower quadrant and periumbilical area. There is no right CVA tenderness, left CVA tenderness, guarding or rebound. Positive signs include McBurney's sign. Negative signs include Murphy's sign, psoas sign and obturator sign.     Hernia: No hernia is present.    Musculoskeletal:     Right lower leg: No edema.     Left lower leg: No edema.  Lymphadenopathy:     Cervical: No cervical adenopathy.  Skin:    General: Skin is warm and dry.  Neurological:     Mental Status: She is alert.      UC Treatments / Results  Labs (all labs ordered are listed, but only abnormal results are displayed) Labs Reviewed  URINE CULTURE  POCT URINALYSIS DIP (MANUAL  ENTRY) negative  POCT URINE PREGNANCY negative  POCT CBC W AUTO DIFF ([facility]):  WBC 6.9; LY 33.1; MO 7.1; GR 59.8; Hgb 13.6; Platelets 222     EKG  Radiology No results found.  Procedures Procedures (including critical care time)  Medications Ordered in UC Medications - No data to display  Initial Impression / Assessment and Plan / UC Course  I have reviewed the triage vital signs and the nursing notes.  Pertinent labs & imaging results that were available during my care of the patient were reviewed by me and considered in my medical decision making (see chart for details).    Normal WBC reassuring; appendicitis unlikely.  Suspect anovulation and subsequent lower abdominal/pelvic/back discomfort. Urine culture pending.  Begin Keflex if culture positive (Given Rx for Keflex to hold). Followup with Family Doctor if not improved in about 10 days.   Final Clinical Impressions(s) / UC Diagnoses   Final diagnoses:     Right lower quadrant abdominal pain     Discharge Instructions     Increase fluid intake. Begin cephalexin (Keflex) if urine culture positive. May take Ibuprofen 200mg , 4 tabs every 8 hours with food as needed for lower abdominal and back discomfort.  If symptoms become significantly worse during the night or over the weekend, proceed to the local emergency room.     ED Prescriptions    Medication Sig Dispense Auth. Provider   cephALEXin (KEFLEX) 500 MG capsule Take 1 capsule (500 mg total) by mouth 2 (two) times daily. (Rx void after 04/25/19) 14 capsule Kandra Nicolas, MD        Kandra Nicolas, MD 04/18/19 [phone]

## 2019-04-19 LAB — POCT CBC W AUTO DIFF (K'VILLE URGENT CARE)

## 2019-04-19 LAB — URINE CULTURE
MICRO NUMBER:: 1077953
Result:: NO GROWTH
SPECIMEN QUALITY:: ADEQUATE

## 2019-04-28 ENCOUNTER — Telehealth: Payer: Self-pay | Admitting: *Deleted

## 2019-04-28 NOTE — Telephone Encounter (Signed)
Pt called requesting Ucx results. Results given.

## 2019-04-29 DIAGNOSIS — R102 Pelvic and perineal pain: Secondary | ICD-10-CM | POA: Diagnosis not present

## 2019-05-04 DIAGNOSIS — R9389 Abnormal findings on diagnostic imaging of other specified body structures: Secondary | ICD-10-CM | POA: Diagnosis not present

## 2019-05-04 DIAGNOSIS — N939 Abnormal uterine and vaginal bleeding, unspecified: Secondary | ICD-10-CM | POA: Diagnosis not present

## 2019-09-23 ENCOUNTER — Ambulatory Visit: Payer: 59 | Admitting: Dermatology

## 2019-09-23 ENCOUNTER — Other Ambulatory Visit: Payer: Self-pay

## 2019-09-23 DIAGNOSIS — D2372 Other benign neoplasm of skin of left lower limb, including hip: Secondary | ICD-10-CM | POA: Diagnosis not present

## 2019-09-23 DIAGNOSIS — Z808 Family history of malignant neoplasm of other organs or systems: Secondary | ICD-10-CM

## 2019-09-23 DIAGNOSIS — L814 Other melanin hyperpigmentation: Secondary | ICD-10-CM

## 2019-09-23 DIAGNOSIS — L821 Other seborrheic keratosis: Secondary | ICD-10-CM

## 2019-09-23 DIAGNOSIS — D229 Melanocytic nevi, unspecified: Secondary | ICD-10-CM

## 2019-09-23 DIAGNOSIS — Z1283 Encounter for screening for malignant neoplasm of skin: Secondary | ICD-10-CM | POA: Diagnosis not present

## 2019-09-23 DIAGNOSIS — D1800 Hemangioma unspecified site: Secondary | ICD-10-CM

## 2019-09-23 DIAGNOSIS — L719 Rosacea, unspecified: Secondary | ICD-10-CM

## 2019-09-23 DIAGNOSIS — D485 Neoplasm of uncertain behavior of skin: Secondary | ICD-10-CM

## 2019-09-23 DIAGNOSIS — D239 Other benign neoplasm of skin, unspecified: Secondary | ICD-10-CM

## 2019-09-23 NOTE — Patient Instructions (Addendum)
Recommend daily broad spectrum sunscreen SPF 30+ to sun-exposed areas, reapply every 2 hours as needed. Call for new or changing lesions.  Recommend taking Heliocare sun protection supplement daily in sunny weather for additional sun protection. For maximum protection on the sunniest days, you can take up to 2 capsules of regular Heliocare OR take 1 capsule of Heliocare Ultra. For prolonged exposure (such as a full day in the sun), you can repeat your dose of the supplement 4 hours after your first dose. Heliocare can be purchased at [facility] or at [email or web address].   Soolantra may be used twice a day to face. Rhofade is once in the morning to help with redness.

## 2019-09-23 NOTE — Progress Notes (Signed)
   New Patient Visit  Subjective  Charlene Carey is a 44 y.o. female who presents for the following: Annual Exam.  Patient is here for skin cancer screening and mole check.   She wants [place] to check spot on left upper chest, patient noticed it a few weeks ago. Spot has grown and feels raised. Has not been treated.   Fhx skin cancer on both maternal and paternal side, uncle with melanoma who has passed away.   No personal history of skin cancer.   Objective  Well appearing patient in no apparent distress; mood and affect are within normal limits.  A full examination was performed including scalp, head, eyes, ears, nose, lips, neck, chest, axillae, abdomen, back, buttocks, bilateral upper extremities, bilateral lower extremities, hands, feet, fingers, toes, fingernails, and toenails. All findings within normal limits unless otherwise noted below.  Objective  Left shin: Firm pink/brown papulenodule with dimple sign.    Objective  Left clavicle: 0.8cm erythematous papule       Objective  Right Lateral Thigh: 0.5cm tan thin papule with scar like center       Objective  Mid face: Erythema at mid-face  Assessment & Plan    Dermatofibroma Left shin  Benign-appearing.  Observation.  Call clinic for new or changing lesions.  Recommend daily use of broad spectrum spf 30+ sunscreen to sun-exposed areas.     Neoplasm of uncertain behavior of skin (2) Left clavicle  Right Lateral Thigh  Left Clavicle - Favor Lichenoid Keratosis vs Inflamed SK > BCC By history present about 3 weeks, will photo and recheck in 1 month. Consider biopsy if indicated at that time.   R lateral thigh - Favor Dermatofibroma Benign-appearing.  Will photo and recheck.   Rosacea Mid face  Reviewed chronic nature.  No grittiness or irritation of the eyes. Patient notices worsening with certain foods including wine.   Discussed Rhofade vs BBL. Advised metronidazole, soolantra and  other topicals may help some but do not help as much with flushing. Sample x 3 of Rhofade given to patient. Lot [id number]  Exp 08/21. Counseled how to use it. Call if she would like a prescription for it. BBL brochure given to patient.  Sample x 2 of Soolantra given to patient. Lot [id number]  Exp 08/23   Lentigines - Scattered tan macules - Discussed due to sun exposure - Benign, observe - Call for any changes  Hemangiomas - Red papules - Discussed benign nature - Observe - Call for any changes  Melanocytic Nevi - Tan-brown and/or pink-flesh-colored symmetric macules and papules - Benign appearing on exam today - Observation - Call clinic for new or changing moles - Recommend daily use of broad spectrum spf 30+ sunscreen to sun-exposed areas.   Seborrheic Keratoses - Stuck-on, waxy, tan-brown papules and plaques  - Discussed benign etiology and prognosis. - Observe - Call for any changes  Return in about 1 month (around 10/23/2019) for recheck L clavicle and right thigh, TBSE in 1 year.   Graciella Belton, RMA, am acting as scribe for Forest Gleason, MD .   Documentation: I have reviewed the above documentation for accuracy and completeness, and I agree with the above.  Forest Gleason, MD

## 2019-09-28 ENCOUNTER — Telehealth: Payer: Self-pay

## 2019-09-28 NOTE — Telephone Encounter (Signed)
Pt called and stated that she is in need of an appt. Pt has not been seen since 2018, but is not yet at her 3 year mark for requiring a new referral. Pt stated that her menstrual cycle has completely stopped, her hair is following out, and she is having moderate difficulty swallowing.  Pt was advised that if she feels that her ability to eat, drink, or breath is becoming difficult, she needs to seek emergency help via contacting 911. Pt did verbalize understanding of this. Pt stated that right now, she is not having difficulty breathing, she just feels like she "has glue stuck in her throat".   Pt was schedule in an open slot tomorrow and advised that I would double check with the provider to verify that it is okay to do same day labs. Pt verbalized understanding of this and will be contacted to confirm appt once MD advises. Pt was again reminded that if, at any time, she begins having further difficulty breathing or swallowing, she should immediately contact EMS. Pt again verbalized understanding.

## 2019-09-28 NOTE — Telephone Encounter (Signed)
I would prefer to see her in the morning at 9:30 AM

## 2019-09-28 NOTE — Telephone Encounter (Signed)
Called pt back and informed her that per Dr. Dwyane Dee she can be seen at 0930. Pt verbalized understanding and agreed. She was instructed to arrive 15 minutes prior to scheduled appt time.

## 2019-09-28 NOTE — Progress Notes (Addendum)
Patient ID: Charlene Carey, female   DOB: [birth year], 44 y.o.   MRN: [id number]           Referring provider: Debbrah Alar  Reason for Appointment: Increased tiredness    History of Present Illness:   Previous history: The patient's thyroid enlargement was first discovered in 2010 or so when she was pregnant and her gynecologist noticed a thyroid swelling and also she was told that her thyroid test was slightly abnormal  She was referred to an endocrinologist who sent her for thyroid biopsy, this was done in 2010 on 2 of the dominant nodule she had an was benign Based on her ultrasound and exam she was recommended another ultrasound 6 months after her visit in 01/2017 but she did not come back for follow-up Has had normal thyroid levels before   RECENT HISTORY:  She has not noticed any change in the swelling of her neck She does not have any difficulty swallowing although sometimes feels that the swallowing is a little slow in the neck area No choking or pressure sensation  Her main symptom and now feeling exhausted.  She said that this has been going on for quite some time but probably more in the last 2 months She is not able to do much activity because of getting tired easily Does not feel unusually cold, no recent weight change Has had significant hair loss continuing for the last few months  Lab Results  Component Value Date   FREET4 1.17 11/13/2016   FREET4 1.4 10/16/2016   TSH 0.38 11/13/2016   TSH 0.34 (L) 10/16/2016    She has had an ultrasound exam in 11/2016 which showed the following Right mid nodule measuring 2.6 x 1.9 x 1.7 cm previously measured 2.1 x 1.8 x 1.4 cm. There are calcifications within the nodule. This was previously biopsied in 2010.  Left mid nodule today measuring 2.3 x 1.4 x 1.0 cm previously measured 2.1 x 1.4 x 0.8 cm. This underwent biopsy in 2010.  She also has a right mid nodule which is 1.8 centimeters in maximum size which is  solid and hypoechoic with T1 Rads score 4 points  Thyroid biopsy in  2010 showed the following from the 2 nodules, both reports were similar   INTERPRETATION(S): BENIGN Comment: There is colloid with admixed lymphocytes suggesting a background of chronic thyroiditis.  There are also follicular epithelial cells predominantlyin flat sheets.  The findings suggest a non-neoplastic process such as anon-neoplastic goiter/hyperplastic nodule    Allergies as of 09/29/2019      Reactions   Codeine Nausea Only   Has withdrawal when trying to stop med      Medication List       Accurate as of September 29, 2019  9:35 AM. If you have any questions, ask your nurse or doctor.        STOP taking these medications   acetic acid-hydrocortisone OTIC solution Commonly known as: VOSOL-HC Stopped by: Elayne Snare, MD   cephALEXin 500 MG capsule Commonly known as: KEFLEX Stopped by: Elayne Snare, MD       Allergies:  Allergies  Allergen Reactions  . Codeine Nausea Only    Has withdrawal when trying to stop med    Past Medical History:  Diagnosis Date  . Allergy-induced asthma   . Aortic stenosis with bicuspid valve   . Ascending aortic aneurysm ([place])   . Frequent headaches   . Heartburn   . History of chicken pox   .  History of gestational diabetes   . Multinodular goiter   . Seasonal allergies     Past Surgical History:  Procedure Laterality Date  . BREAST REDUCTION SURGERY    . OTHER SURGICAL HISTORY     had d& c  . RIGHT/LEFT HEART CATH AND CORONARY ANGIOGRAPHY N/A 11/18/2016   Procedure: Right/Left Heart Cath and Coronary Angiography;  Surgeon: Lorretta Harp, MD;  Location: [facility];  Service: Cardiovascular;  Laterality: N/A;  . TEE WITHOUT CARDIOVERSION N/A 01/27/2017   Procedure: TRANSESOPHAGEAL ECHOCARDIOGRAM (TEE);  Surgeon: Sanda Klein, MD;  Location: [facility];  Service: Cardiovascular;  Laterality: N/A;    Family History  Problem Relation Age  of Onset  . Hypertension Mother   . Thyroid disease Mother        Hypothyroid  . Hypertension Maternal Grandfather   . Stroke Maternal Grandfather   . Hyperlipidemia Maternal Grandfather   . Diabetes Father     Social History:  reports that she has quit smoking. She has never used smokeless tobacco. She reports current alcohol use of about 1.0 standard drinks of alcohol per week. She reports that she does not use drugs.   Review of Systems:  Review of Systems  Constitutional: Negative for weight loss and weight gain.  HENT: Negative for hoarseness.   Respiratory: Negative for shortness of breath.   Endocrine: Positive for fatigue.       Was having regular menstrual cycles till about October 2020, subsequently they are infrequent, last had a light cycle 2 weeks ago.  No hot flashes.  No specific recommendation by gynecologist in 12/20  Musculoskeletal: Positive for joint pain.       She has pains in her lower legs  Neurological:       She gets some tingling in her left hand  Psychiatric/Behavioral: Positive for nervousness.   Wt Readings from Last 3 Encounters:  09/29/19 166 lb 3.2 oz (75.4 kg)  04/18/19 165 lb (74.8 kg)  12/06/18 166 lb 8 oz (75.5 kg)        Examination:   BP 118/74 (BP Location: Left Arm, Patient Position: Sitting, Cuff Size: Normal)   Pulse (!) 110   Ht 5\' 6"  (1.676 m)   Wt 166 lb 3.2 oz (75.4 kg)   SpO2 96%   BMI 26.83 kg/m    General Appearance: pleasant,  Averagely built and nourished Not anxious looking Mild puffiness of the face present        Neck: The thyroid is enlarged diffusely, mostly on the right side. Neck circumference is about 37.5-38 cm over the thyroid The right lobe is fleshy texture with firm nodular area in the superior thyroid laterally Right lobe is about 2-2.5 times normal  Thyroid nodule felt in the isthmus also Left lobe measures about 1.5-2 times normal twice normal, slightly firm laterally  There is no stridor    There is no lymphadenopathy.     Cardiovascular: S2 is loud.  She has a 4/6 systolic ejection murmur at the base  Respiratory:  Lungs clear  Neurological: REFLEXES: at biceps are slightly brisk, ankle reflexes are normal Minimal fine tremor present   Assessment/Plan:  FATIGUE: She will have thyroid levels checked today along with CBC Also will screen for abnormal chemistry including glucose However unlikely to have hypothyroidism as she does not have any other typical symptoms Because of her stressful personal life she may be having issues with depression and anxiety and would recommend follow-up with PCP  Multinodular goiter, long-standing He has a slight difficulty with swallowing but this appears to be unlikely related to her goiter  Her exam does not show any significant change from her previous exam and her thyroid enlargement appears to be slightly smaller Follow-up ultrasound will be ordered  Secondary oligomenorrhea: Likely to be early menopause and will check [place]  She is reportedly vitamin D deficient and is asking for follow-up level, this will be checked Currently is taking 10,000 units vitamin D3 although irregularly  Hair loss: Etiology unclear unless she is having hypothyroidism as a cause   Elayne Snare 09/29/2019   Addendum: Thyroid ultrasound is stable with only slight increase in size of the previously benign nodule.  She will follow-up in April 2022

## 2019-09-29 ENCOUNTER — Ambulatory Visit: Payer: 59 | Admitting: Endocrinology

## 2019-09-29 ENCOUNTER — Encounter: Payer: Self-pay | Admitting: Endocrinology

## 2019-09-29 ENCOUNTER — Other Ambulatory Visit: Payer: Self-pay

## 2019-09-29 VITALS — BP 118/74 | HR 110 | Ht 66.0 in | Wt 166.2 lb

## 2019-09-29 DIAGNOSIS — E559 Vitamin D deficiency, unspecified: Secondary | ICD-10-CM | POA: Diagnosis not present

## 2019-09-29 DIAGNOSIS — N911 Secondary amenorrhea: Secondary | ICD-10-CM

## 2019-09-29 DIAGNOSIS — R5383 Other fatigue: Secondary | ICD-10-CM

## 2019-09-29 DIAGNOSIS — E042 Nontoxic multinodular goiter: Secondary | ICD-10-CM

## 2019-09-29 LAB — TSH: TSH: 0.32 u[IU]/mL — ABNORMAL LOW (ref 0.35–4.50)

## 2019-09-29 LAB — COMPREHENSIVE METABOLIC PANEL
ALT: 17 U/L (ref 0–35)
AST: 19 U/L (ref 0–37)
Albumin: 4.5 g/dL (ref 3.5–5.2)
Alkaline Phosphatase: 47 U/L (ref 39–117)
BUN: 13 mg/dL (ref 6–23)
CO2: 29 mEq/L (ref 19–32)
Calcium: 9.8 mg/dL (ref 8.4–10.5)
Chloride: 103 mEq/L (ref 96–112)
Creatinine, Ser: 0.82 mg/dL (ref 0.40–1.20)
GFR: 74.99 mL/min (ref >60.00)
Glucose, Bld: 104 mg/dL — ABNORMAL HIGH (ref 70–99)
Potassium: 5.3 mEq/L — ABNORMAL HIGH (ref 3.5–5.1)
Sodium: 137 mEq/L (ref 135–145)
Total Bilirubin: 0.4 mg/dL (ref 0.2–1.2)
Total Protein: 7.2 g/dL (ref 6.0–8.3)

## 2019-09-29 LAB — FOLLICLE STIMULATING HORMONE: FSH: 5.7 m[IU]/mL

## 2019-09-29 LAB — VITAMIN D 25 HYDROXY (VIT D DEFICIENCY, FRACTURES): VITD: 43.8 ng/mL (ref 30.00–100.00)

## 2019-09-29 LAB — T4, FREE: Free T4: 1.17 ng/dL (ref 0.60–1.60)

## 2019-09-30 ENCOUNTER — Ambulatory Visit: Payer: 59 | Admitting: Endocrinology

## 2019-09-30 LAB — CBC WITH DIFFERENTIAL/PLATELET
Basophils Absolute: 0.1 10*3/uL (ref 0.0–0.1)
Basophils Relative: 1 % (ref 0.0–3.0)
Eosinophils Absolute: 0 10*3/uL (ref 0.0–0.7)
Eosinophils Relative: 0.3 % (ref 0.0–5.0)
HCT: 43.1 % (ref 36.0–46.0)
Hemoglobin: 14.4 g/dL (ref 12.0–15.0)
Lymphocytes Relative: 26.6 % (ref 12.0–46.0)
Lymphs Abs: 1.8 10*3/uL (ref 0.7–4.0)
MCHC: 33.3 g/dL (ref 30.0–36.0)
MCV: 90.8 fl (ref 78.0–100.0)
Monocytes Absolute: 0.4 10*3/uL (ref 0.1–1.0)
Monocytes Relative: 6.4 % (ref 3.0–12.0)
Neutro Abs: 4.3 10*3/uL (ref 1.4–7.7)
Neutrophils Relative %: 65.7 % (ref 43.0–77.0)
Platelets: 253 10*3/uL (ref 150.0–400.0)
RBC: 4.75 Mil/uL (ref 3.87–5.11)
RDW: 13 % (ref 11.5–15.5)
WBC: 6.6 10*3/uL (ref 4.0–10.5)

## 2019-09-30 LAB — ESTRADIOL: Estradiol: 318 pg/mL

## 2019-10-01 ENCOUNTER — Telehealth: Payer: Self-pay | Admitting: Endocrinology

## 2019-10-01 NOTE — Telephone Encounter (Signed)
Patient requests to be called at ph# [phone]  Re:lab results patient reviewed on MyChart. Patient would like to discuss her lab results and possible imaging appointment.

## 2019-10-05 ENCOUNTER — Other Ambulatory Visit: Payer: Self-pay

## 2019-10-05 ENCOUNTER — Encounter: Payer: Self-pay | Admitting: Cardiovascular Disease

## 2019-10-05 ENCOUNTER — Other Ambulatory Visit: Payer: Self-pay | Admitting: Endocrinology

## 2019-10-05 ENCOUNTER — Ambulatory Visit (INDEPENDENT_AMBULATORY_CARE_PROVIDER_SITE_OTHER): Payer: 59 | Admitting: Cardiovascular Disease

## 2019-10-05 DIAGNOSIS — E042 Nontoxic multinodular goiter: Secondary | ICD-10-CM

## 2019-10-05 DIAGNOSIS — Q231 Congenital insufficiency of aortic valve: Secondary | ICD-10-CM

## 2019-10-05 DIAGNOSIS — R0789 Other chest pain: Secondary | ICD-10-CM

## 2019-10-05 DIAGNOSIS — I712 Thoracic aortic aneurysm, without rupture, unspecified: Secondary | ICD-10-CM

## 2019-10-05 DIAGNOSIS — Q230 Congenital stenosis of aortic valve: Secondary | ICD-10-CM | POA: Diagnosis not present

## 2019-10-05 NOTE — Assessment & Plan Note (Signed)
History of atypical chest pain with normal coronary arteries and cath check performed 11/18/2016.

## 2019-10-05 NOTE — Patient Instructions (Signed)
Medication Instructions:  The current medical regimen is effective;  continue present plan and medications.  *If you need a refill on your cardiac medications before your next appointment, please call your pharmacy*   Lab Work: LIPID and LIVER  Attached are the lab orders that are needed before your upcoming appointment, please come in anytime to have your labs drawn.   They are fasting labs, so nothing to eat or drink after midnight.  Lab hours: 8:00-4:00 lunch hours 12:45-1:45  If you have labs (blood work) drawn today and your tests are completely normal, you will receive your results only by: Marland Kitchen MyChart Message (if you have MyChart) OR . A paper copy in the mail If you have any lab test that is abnormal or we need to change your treatment, we will call you to review the results.   Testing/Procedures: Chest CT Angio   Echocardiogram - Your physician has requested that you have an echocardiogram. Echocardiography is a painless test that uses sound waves to create images of your heart. It provides your doctor with information about the size and shape of your heart and how well your heart's chambers and valves are working. This procedure takes approximately one hour. There are no restrictions for this procedure. This will be performed at our [facility] location - [address], [address].    Follow-Up: At [facility], you and your health needs are our priority.  As part of our continuing mission to provide you with exceptional heart care, we have created designated Provider Care Teams.  These Care Teams include your primary Cardiologist (physician) and Advanced Practice Providers (APPs -  Physician Assistants and Nurse Practitioners) who all work together to provide you with the care you need, when you need it.  We recommend signing up for the patient portal called "MyChart".  Sign up information is provided on this After Visit Summary.  MyChart is used to connect with patients for  Virtual Visits (Telemedicine).  Patients are able to view lab/test results, encounter notes, upcoming appointments, etc.  Non-urgent messages can be sent to your provider as well.   To learn more about what you can do with MyChart, go to [email or web address].    Your next appointment:   12 month(s)  The format for your next appointment:   In Person  Provider:   Quay Burow, MD
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
## 2019-10-05 NOTE — Assessment & Plan Note (Signed)
History of moderate aortic stenosis of a bicuspid aortic valve by transesophageal echo performed Dr. Sallyanne Kuster 01/27/2017 the valve area of 1.3 cm and a peak gradient of 36 mmHg.  I did refer her to Dr. Roxy Manns for surgical evaluation at that time who did not feel that she was clinically ready to have her valve repaired at that time.  Going to recheck carotid Doppler studies.

## 2019-10-05 NOTE — Telephone Encounter (Signed)
Discussed with patient that all lab results are normal and not explaining her fatigue.  Has had mild increase in potassium previously seen by PCP for this.  Advised her to follow-up with PCP for further management.  Thyroid ultrasound has been ordered

## 2019-10-05 NOTE — Assessment & Plan Note (Signed)
History of a small ascending thoracic aortic aneurysm measuring 40 mm by CTA 12/06/2016.  We will recheck thoracic CTA to further evaluate for progression

## 2019-10-07 ENCOUNTER — Encounter: Payer: Self-pay | Admitting: Dermatology

## 2019-10-08 ENCOUNTER — Other Ambulatory Visit: Payer: 59

## 2019-10-14 ENCOUNTER — Ambulatory Visit
Admission: RE | Admit: 2019-10-14 | Discharge: 2019-10-14 | Disposition: A | Discharge status: Home / Self Care | Payer: 59 | Source: Ambulatory Visit | Attending: Endocrinology | Admitting: Endocrinology

## 2019-10-14 DIAGNOSIS — E042 Nontoxic multinodular goiter: Secondary | ICD-10-CM

## 2019-10-14 IMAGING — US US THYROID
1 series · 12 of 25 positions shown · non-contrast
Comparison: [DATE]

CLINICAL DATA: Thyroid nodules. Bilateral dominant nodules
underwent FNA [7W].

EXAM:
THYROID ULTRASOUND
TECHNIQUE: Ultrasound examination of the thyroid gland and adjacent soft
tissues was performed.

[Series 1: us thyroid · 0.05mm/px · 12 of 98 slices shown]
[im 5/98]
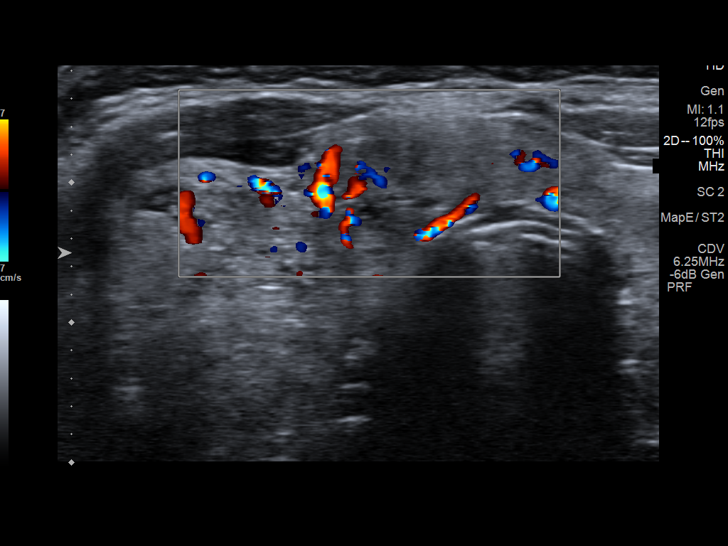
[im 13/98]
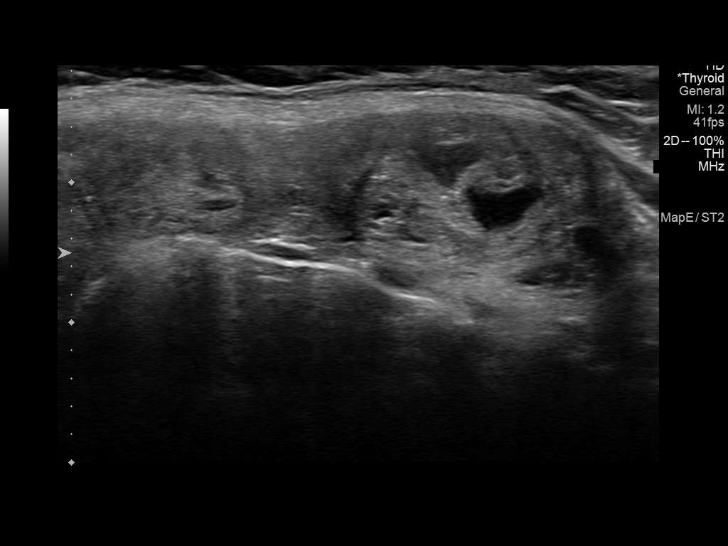
[im 21/98]
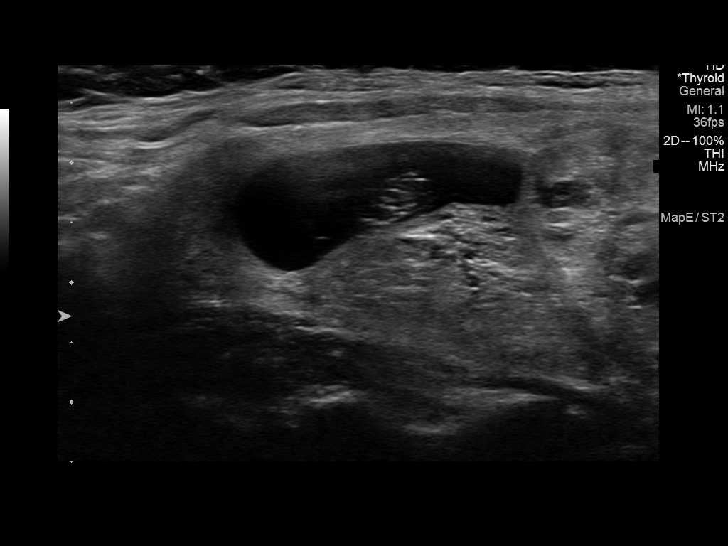
[im 29/98]
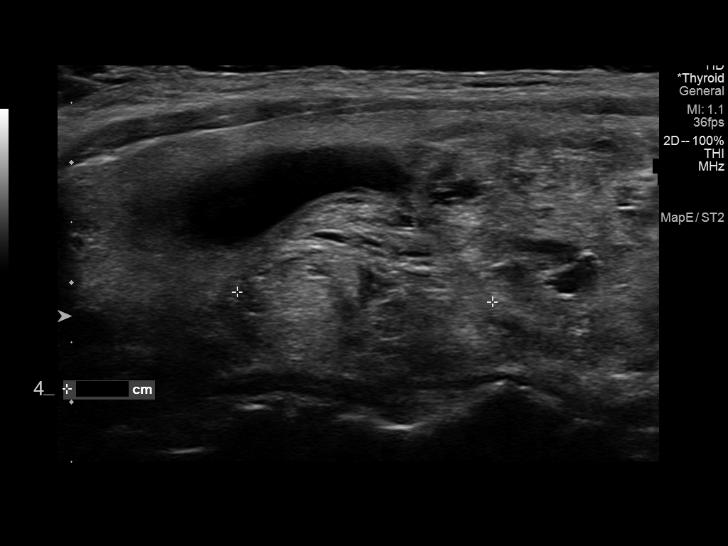
[im 37/98]
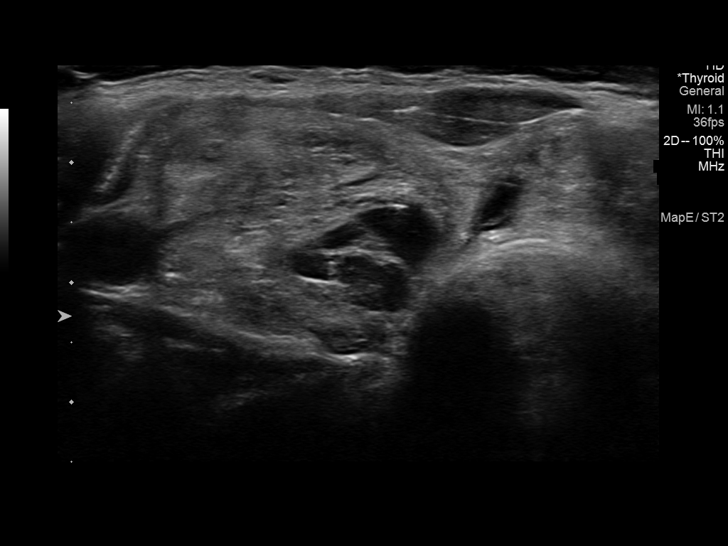
[im 45/98]
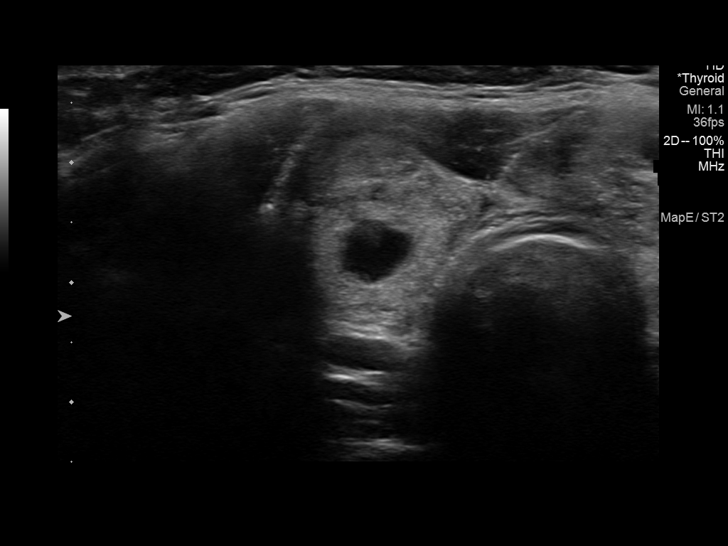
[im 53/98]
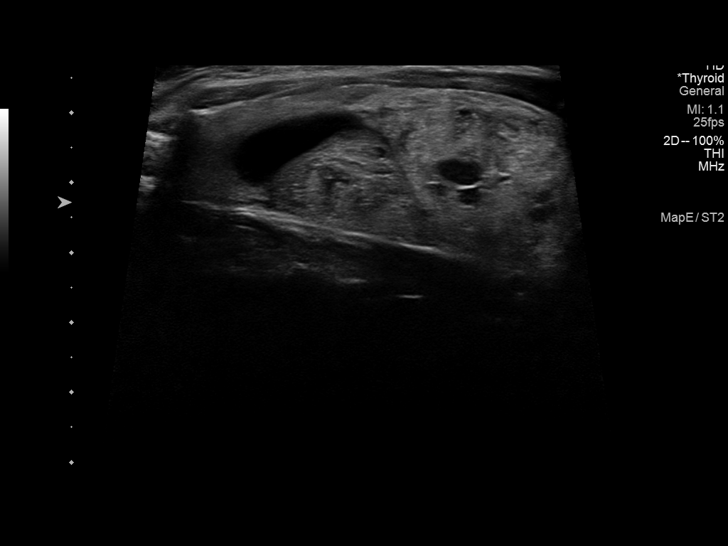
[im 61/98]
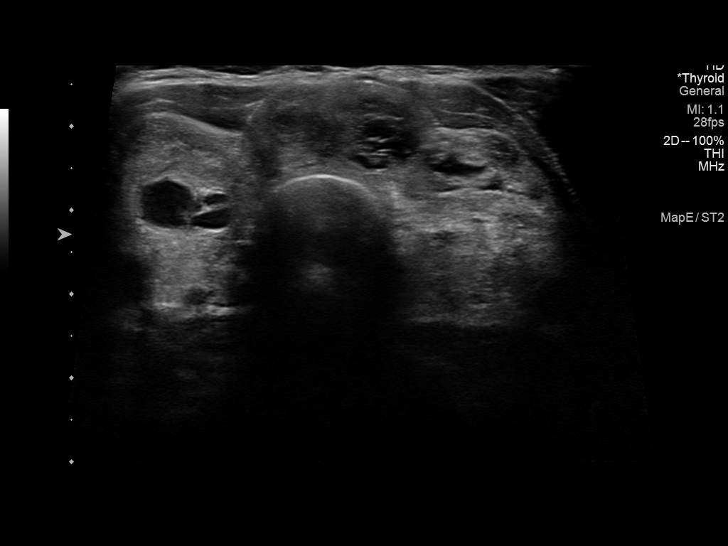
[im 69/98]
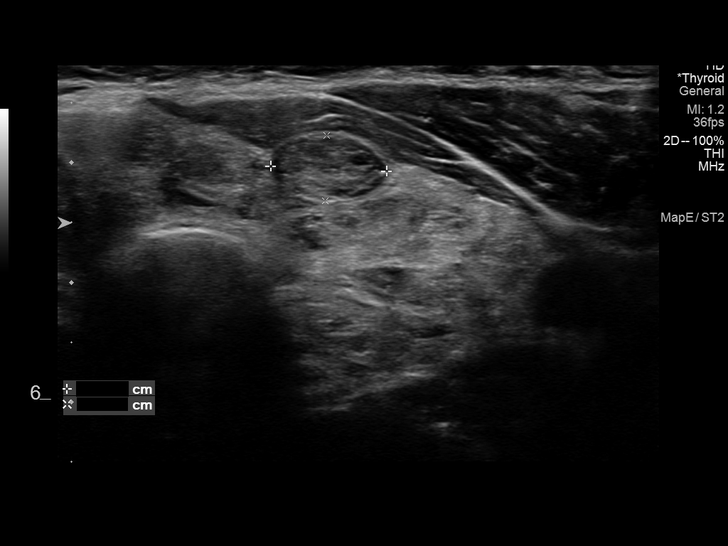
[im 77/98]
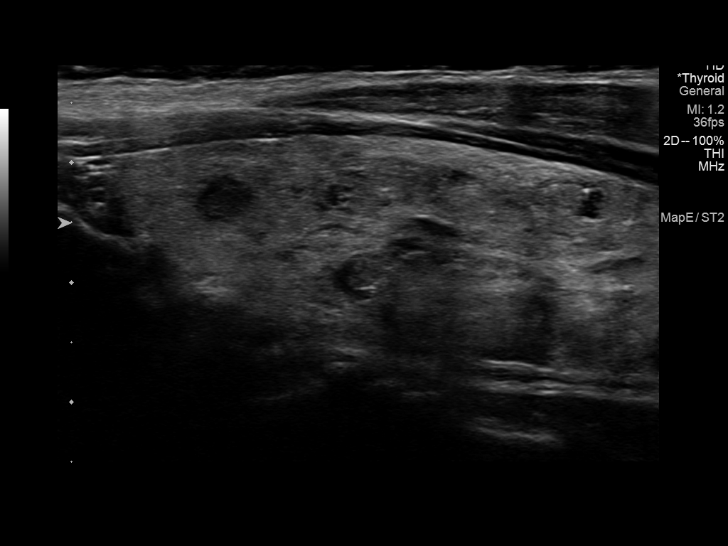
[im 85/98]
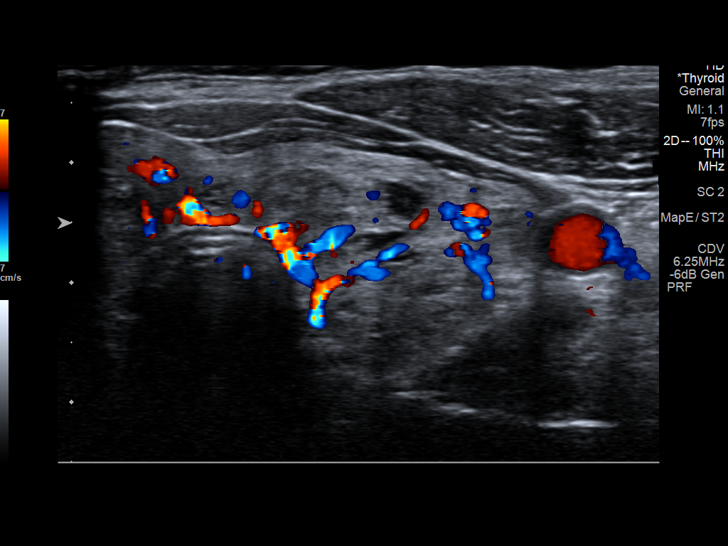
[im 93/98]
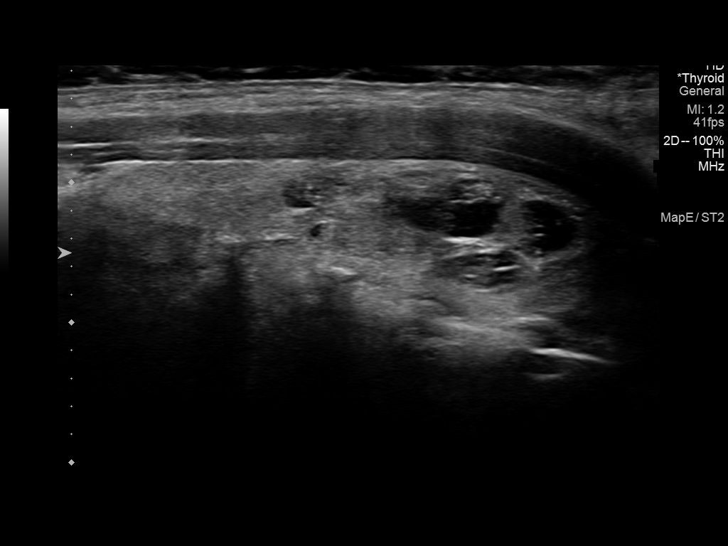

[12 of 25 positions shown; findings below may reference images not displayed]

FINDINGS: Parenchymal Echotexture: Moderately heterogenous

Isthmus: 1.2 cm, previously 0.8 cm

Right lobe: 6.4 x 2.2 x 2.7 cm, previously 5.4 x 2.1 x 2.3 cm

Left lobe: 6.3 x 1.9 x 2.3 cm, previously 5.9 x 1.6 x 2.2 cm

_________________________________________________________

Estimated total number of nodules >/= 1 cm: 6-10

Number of spongiform nodules >/=  2 cm not described below (TR1): 0

Number of mixed cystic and solid nodules >/= 1.5 cm not described
below (TR2): 0

_________________________________________________________

Nodule 1 is along the right side of the isthmus. This is a poorly
defined isoechoic nodule that measures 1.1 x 0.8 x 0.9 cm and
previously measured 1.4 x 0.8 x 0.9 cm.

Nodule 2 is a large heterogeneous nodule in the isthmus that
measures 1.8 x 1.1 x 1.9 cm. This nodule is predominantly isoechoic
with scattered cystic areas. Nodule previously measured 1.7 x 0.9 x
1.8 cm. This is compatible with a TR 3 nodule.

Nodule 3 is a mixed cystic and solid nodule in the superior right
thyroid lobe. The solid component is isoechoic. This nodule measures
2.4 x 1.0 x 1.8 cm. This nodule does not meet criteria for biopsy or
dedicated follow-up.

Nodule 4 in the mid right thyroid lobe is predominantly a solid
isoechoic nodule. Nodule 4 measures 2.1 x 1.6 x 1.7 cm and not
clearly measured on the previous examination. This is compatible
with a TR 3 nodule.

Nodule 5 is the dominant nodule in the inferior right thyroid lobe
and probably represents the previously biopsied nodule. Nodule is
isoechoic but heterogeneous. Nodule 5 measures 3.3 x 1.8 x 2.5 cm
and previously measured 2.6 x 1.7 x 1.9 cm.

Nodule 6 in the mid left thyroid lobe is either spongiform or
isoechoic with cystic areas. This nodule measures 1.2 x 0.6 x
cm. This nodule previously measured roughly 0.6 cm in the transverse
dimension.

Nodule 7 in the posterior mid left thyroid lobe is predominantly
isoechoic, measures 1.7 x 0.9 x 1.4 cm and previously measured 2.3 x
1.0 x 1.4 cm. This nodule was likely biopsied in [7W].
IMPRESSION: 1. Multinodular goiter.
2. Nodule 2 is a TR 3 nodule in the isthmus. Nodule has minimally
changed since [7W]. Recommend continued follow-up to ensure at least
5 years of stability.
3. Nodule 4 is an isoechoic nodule in the mid right thyroid lobe.
This nodule is a TR 3 nodule and meets criteria for 1 year
follow-up.
4. Nodule 5 likely represents the previously biopsied nodule from
[7W]. This nodule has enlarged in size.
5. Nodule 7 in the posterior mid left thyroid lobe may represent the
previously biopsied nodule. This nodule has decreased in size since
[7W].

The above is in keeping with the ACR TI-RADS recommendations - [HOSPITAL] [7W];[DATE].

## 2019-10-26 ENCOUNTER — Other Ambulatory Visit: Payer: Self-pay

## 2019-10-26 ENCOUNTER — Ambulatory Visit: Payer: 59 | Admitting: Dermatology

## 2019-10-26 DIAGNOSIS — D2371 Other benign neoplasm of skin of right lower limb, including hip: Secondary | ICD-10-CM | POA: Diagnosis not present

## 2019-10-26 DIAGNOSIS — D239 Other benign neoplasm of skin, unspecified: Secondary | ICD-10-CM

## 2019-10-26 DIAGNOSIS — L814 Other melanin hyperpigmentation: Secondary | ICD-10-CM

## 2019-10-26 DIAGNOSIS — L821 Other seborrheic keratosis: Secondary | ICD-10-CM

## 2019-10-26 DIAGNOSIS — D229 Melanocytic nevi, unspecified: Secondary | ICD-10-CM

## 2019-10-26 NOTE — Patient Instructions (Signed)
Recommend daily broad spectrum sunscreen SPF 30+ to sun-exposed areas, reapply every 2 hours as needed. Call for new or changing lesions.  

## 2019-10-26 NOTE — Progress Notes (Signed)
   Follow-Up Visit   Subjective  Charlene Carey is a 44 y.o. 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 The lesion on the left clavicle is now a tan color and no longer pink. No changes with the lesion on the right lateral thigh. Both lesions as asymptomatic.    The following portions of the chart were reviewed this encounter and updated as appropriate:      Review of Systems:  No other skin or systemic complaints except as noted in HPI or Assessment and Plan.  Objective  Well appearing patient in no apparent distress; mood and affect are within normal limits.  A focused examination was performed including face, chest, right leg. Relevant physical exam findings are noted in the Assessment and Plan.  Objective  Right Lateral Thigh: 5.42mm waxy tan slightly firm flat papule, no change when compared to photo  Objective  Left upper clavicle: Waxy light tan macule, decreased erythema when compared to photo    Assessment & Plan   Seborrheic Keratoses - Stuck-on, waxy, tan-brown papules and plaques  - Discussed benign etiology and prognosis. - Observe - Call for any changes      Lentigines - Scattered tan macules - Discussed due to sun exposure - Benign, observe - Call for any changes  Recommend daily broad spectrum sunscreen SPF 30+ to sun-exposed areas, reapply every 2 hours as needed. Call for new or changing lesions.   Melanocytic Nevi - Tan-brown and/or pink-flesh-colored symmetric macules and papules - Benign appearing on exam today - Observation - Call clinic for new or changing moles - Recommend daily use of broad spectrum spf 30+ sunscreen to sun-exposed areas.    Dermatofibroma Right Lateral Thigh  vs ISK  Discussed LN2 if irritated. Benign, observe.    Seborrheic keratosis Left upper clavicle  Hx of inflammation, resolved now. Discussed  LN2 if becomes inflamed again. Benign, observe.    Return in about 1 year (around 10/25/2020) for TBSE with Dr Laurence Ferrari.  IJamesetta Orleans, CMA, am acting as scribe for Brendolyn Patty, MD .  Documentation: I have reviewed the above documentation for accuracy and completeness, and I agree with the above.  Brendolyn Patty MD

## 2019-10-27 ENCOUNTER — Ambulatory Visit (INDEPENDENT_AMBULATORY_CARE_PROVIDER_SITE_OTHER)
Admission: RE | Admit: 2019-10-27 | Discharge: 2019-10-27 | Disposition: A | Discharge status: Home / Self Care | Payer: 59 | Source: Ambulatory Visit | Attending: Cardiovascular Disease | Admitting: Cardiovascular Disease

## 2019-10-27 ENCOUNTER — Ambulatory Visit (HOSPITAL_COMMUNITY): Payer: 59 | Attending: Cardiovascular Disease

## 2019-10-27 DIAGNOSIS — I712 Thoracic aortic aneurysm, without rupture, unspecified: Secondary | ICD-10-CM

## 2019-10-27 DIAGNOSIS — Q230 Congenital stenosis of aortic valve: Secondary | ICD-10-CM | POA: Insufficient documentation

## 2019-10-27 DIAGNOSIS — Q231 Congenital insufficiency of aortic valve: Secondary | ICD-10-CM | POA: Insufficient documentation

## 2019-10-27 IMAGING — CT CT ANGIO CHEST
3 of 8 series · 18 of 46 positions shown · IV contrast (OMNIPAQUE 350)
Comparison: [DATE]

CLINICAL DATA: Thoracic aortic aneurysm without rupture, follow-up

EXAM:
CT ANGIOGRAPHY CHEST WITH CONTRAST
TECHNIQUE: Multidetector CT imaging of the chest was performed using the
standard protocol during bolus administration of intravenous
contrast. Multiplanar CT image reconstructions and MIPs were
obtained to evaluate the vascular anatomy.
CONTRAST:  100mL OMNIPAQUE IOHEXOL 350 MG/ML SOLN

[Series 4: aorta 3.0 bf37 2 · axial · 0.67mm/px · z∈[-291,-30]mm · 13 of 103 slices shown]
[im 8/103  lung]
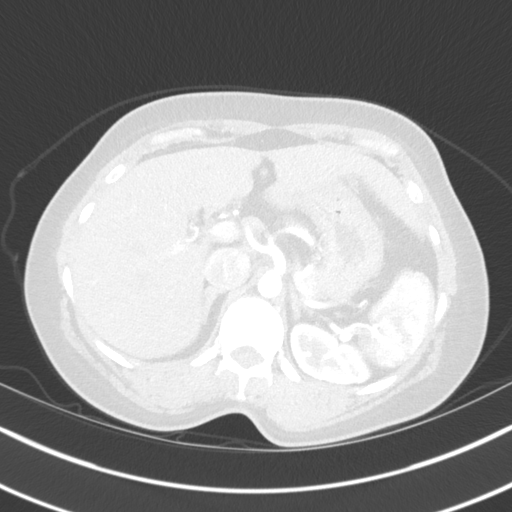
[im 15/103  soft-tissue]
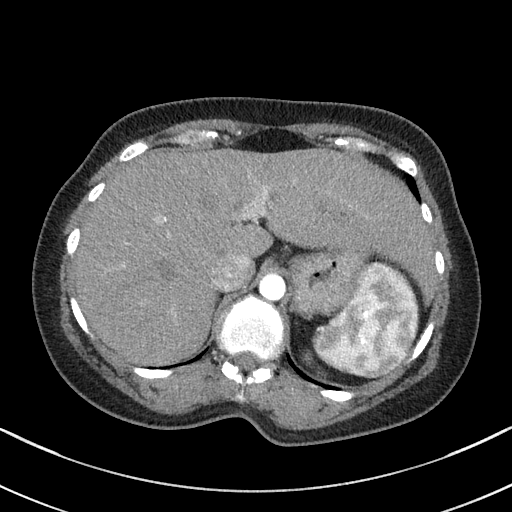
[im 22/103  lung]
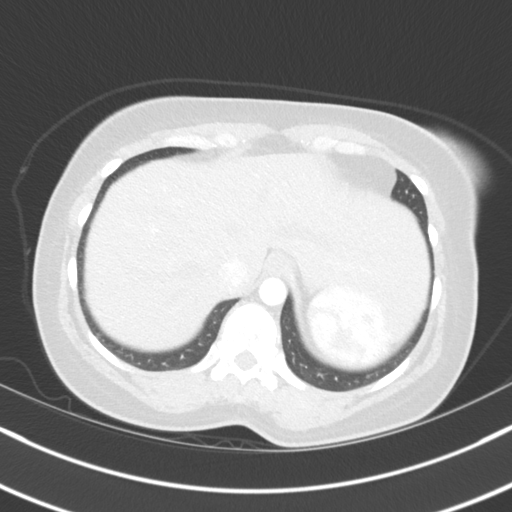
[im 30/103  soft-tissue]
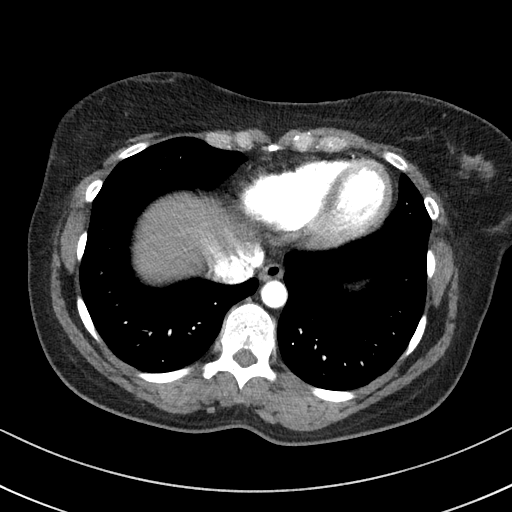
[im 37/103  lung]
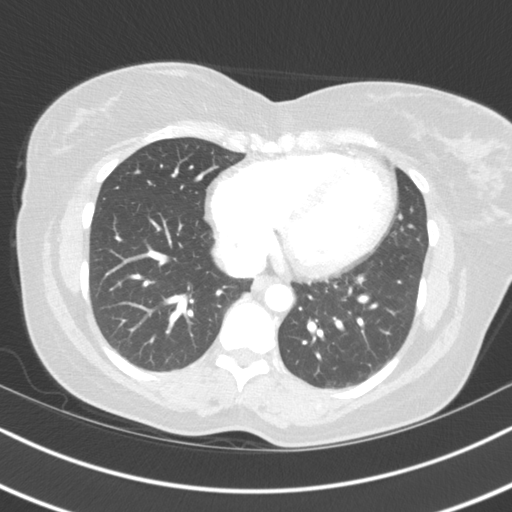
[im 44/103  soft-tissue]
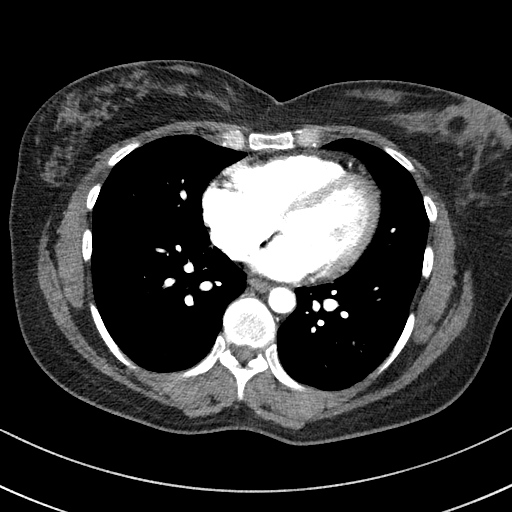
[im 52/103  lung]
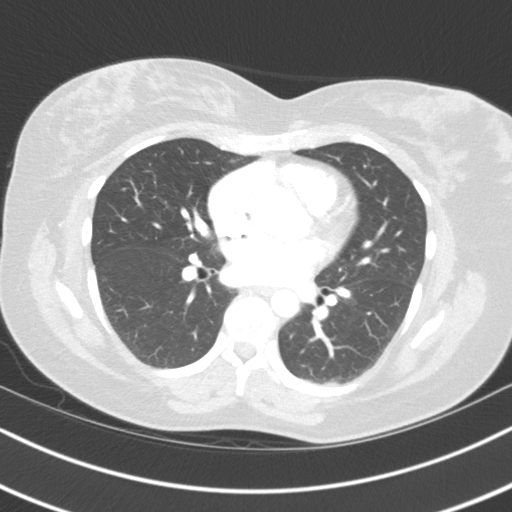
[im 59/103  soft-tissue]
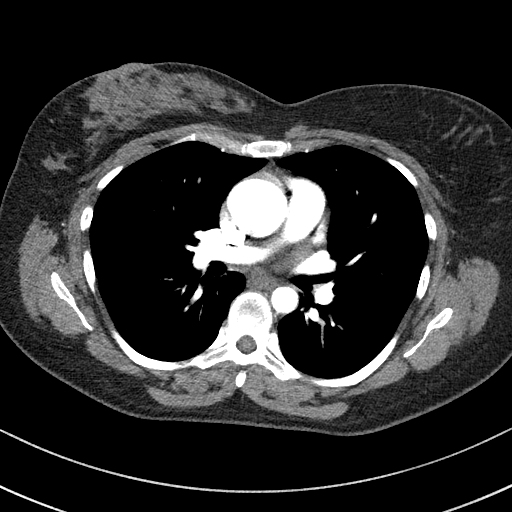
[im 66/103  lung]
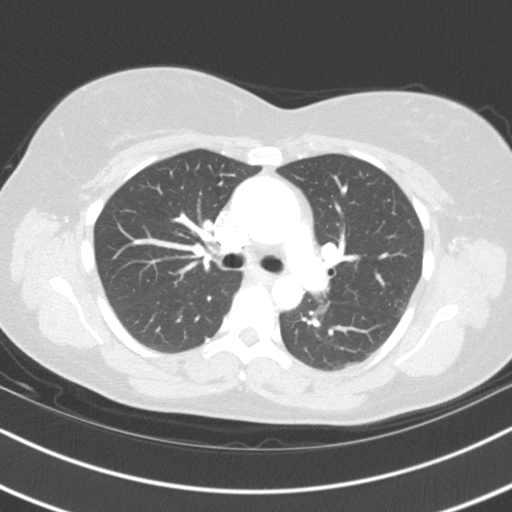
[im 73/103  soft-tissue]
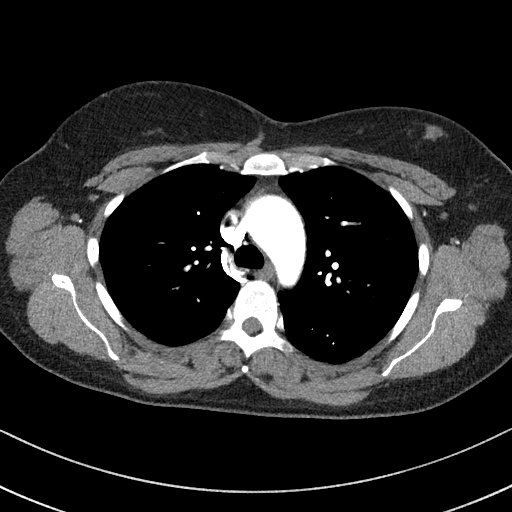
[im 81/103  lung]
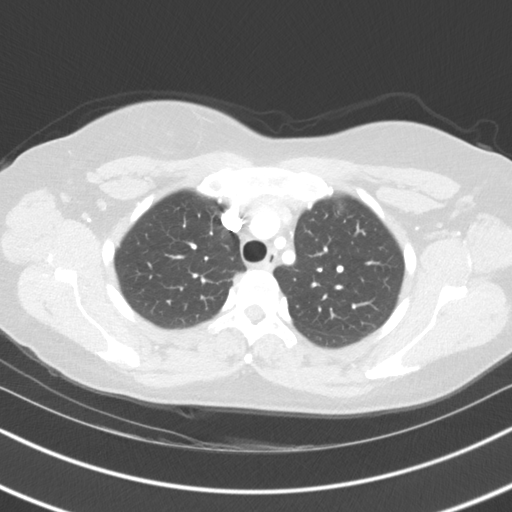
[im 88/103  soft-tissue]
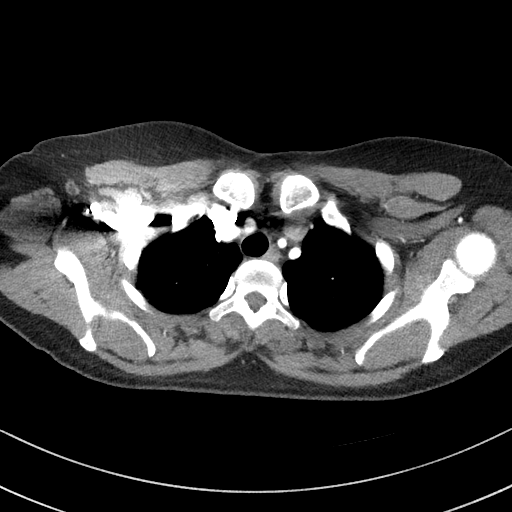
[im 95/103  lung]
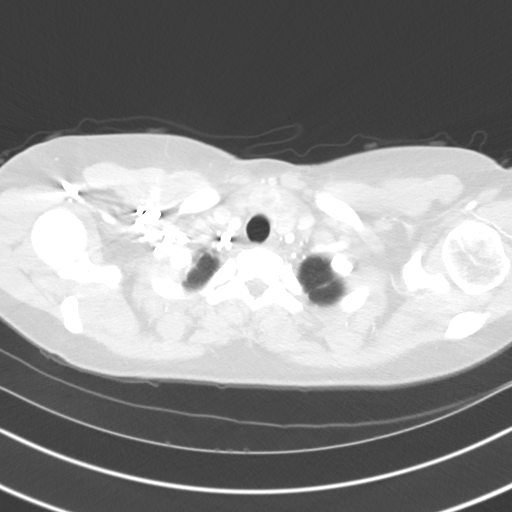

[Series 5: lung · axial · 0.67mm/px · z∈[-291,-249]mm · 2 of 103 slices shown]
[im 8/103  soft-tissue]
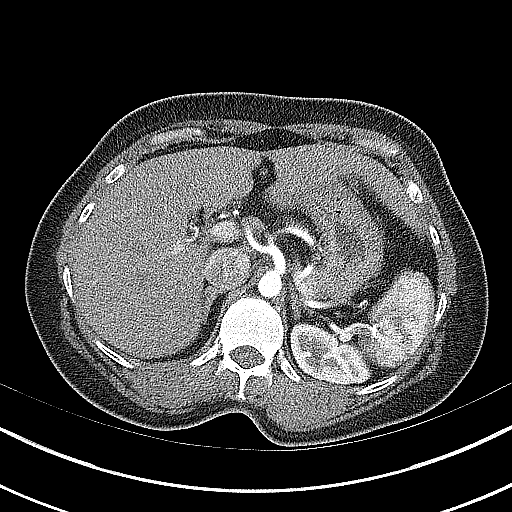
[im 22/103  soft-tissue]
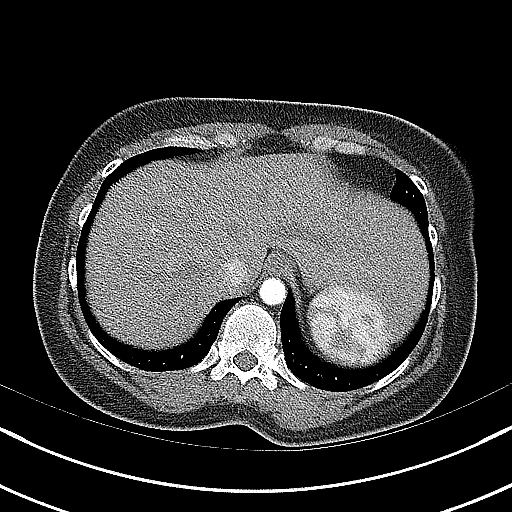

[Series 7: coronals · coronal · 0.59mm/px · 3 of 131 slices shown]
[im 33/131  soft-tissue]
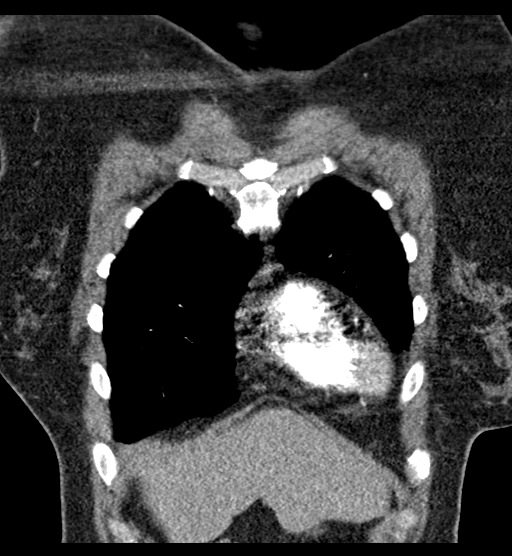
[im 66/131  soft-tissue]
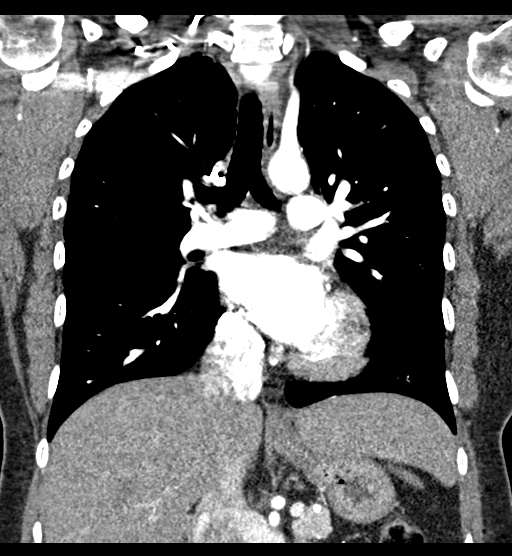
[im 98/131  soft-tissue]
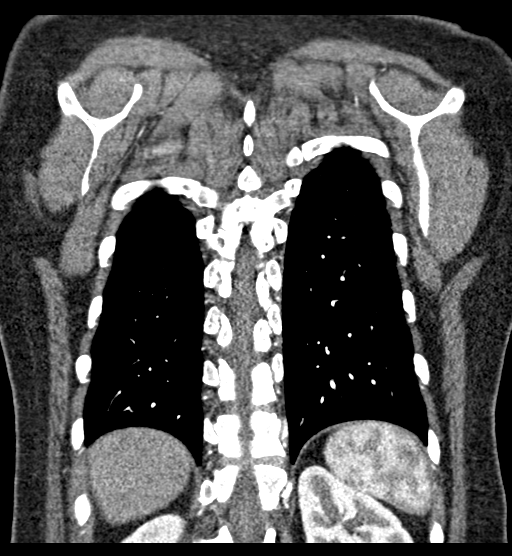

[18 of 46 positions shown; findings below may reference images not displayed]

FINDINGS: Cardiovascular: Heart size normal. No pericardial effusion.
Satisfactory opacification of pulmonary arteries noted, and there is
no evidence of pulmonary emboli. There is good contrast
opacification of the thoracic aorta.

Aortic Root:

--Valve: 2.9 cm

--Sinuses: 3.3 cm

--Sinotubular Junction: 2.6 cm

Limitations by motion: Moderate

Thoracic Aorta:

--Ascending Aorta: 4.1 cm (previously 4 cm)

--Aortic Arch: 3.4 cm (previously 3.5)

--Descending Aorta: 2 cm

No dissection or stenosis. Classic 3 vessel brachiocephalic arterial
origin anatomy without proximal stenosis. No significant
atheromatous change. Visualized proximal abdominal aorta
unremarkable.

Mediastinum/Nodes: Enlarged multinodular thyroid which has
previously been evaluated. No hilar or mediastinal adenopathy.

Lungs/Pleura: No pleural effusion.  No pneumothorax.  Lungs clear.

Upper Abdomen: No acute findings.

Musculoskeletal: Left shoulder DJD. No fracture or worrisome bone
lesion.

Review of the MIP images confirms the above findings.
IMPRESSION: 1. Stable 4.1 cm ascending thoracic aorta and 3.4 cm arch aneurysm.
Recommend annual imaging followup by CTA or MRA. This recommendation
follows [I4] ACCF/AHA/AATS/ACR/ASA/SCA/DIETER/DIETER/DIETER/DIETER Guidelines
for the Diagnosis and Management of Patients with Thoracic Aortic
Disease. Circulation.[I4]; 121: [id number]

## 2019-10-27 MED ORDER — IOHEXOL 350 MG/ML SOLN
100.0000 mL | Freq: Once | INTRAVENOUS | Status: AC | PRN
  Administered 2019-10-27: 100 mL via INTRAVENOUS

## 2019-10-28 ENCOUNTER — Telehealth: Payer: Self-pay | Admitting: Cardiovascular Disease

## 2019-10-28 NOTE — Telephone Encounter (Signed)
Lm to call back ./cy 

## 2019-10-28 NOTE — Telephone Encounter (Signed)
Charlene Carey is calling to schedule her appointment to discuss her results per Dr. Kennon Holter request. The appointment has been scheduled for his first available on 11/02/19, but Charlene Carey is requesting a nurse call her to discuss why he wanted to wait for her to come in to receive her results. Wilfred states if it's due to them being bad she would rather know now then wait until Tuesday. Please advise.

## 2019-10-29 ENCOUNTER — Other Ambulatory Visit: Payer: Self-pay

## 2019-10-29 ENCOUNTER — Telehealth: Payer: Self-pay

## 2019-10-29 DIAGNOSIS — I712 Thoracic aortic aneurysm, without rupture, unspecified: Secondary | ICD-10-CM

## 2019-10-29 NOTE — Telephone Encounter (Signed)
Spoke to patient chest ct results given.Advised to repeat in 12 months.

## 2019-10-29 NOTE — Telephone Encounter (Signed)
Called patient- I advised with Dr.Berry in office today and he stated that she would be okay to wait until Tuesday- there was nothing on the ECHO that couldn't wait, he just wanted to discuss with her in office to go over anything in case of any questions or concerns and map out a further plan.  LVM for patient to call back if continued issues.

## 2019-11-02 ENCOUNTER — Encounter: Payer: Self-pay | Admitting: Cardiovascular Disease

## 2019-11-02 ENCOUNTER — Other Ambulatory Visit: Payer: Self-pay

## 2019-11-02 ENCOUNTER — Ambulatory Visit: Payer: 59 | Admitting: Cardiovascular Disease

## 2019-11-02 VITALS — BP 124/84 | HR 81 | Ht 66.0 in | Wt 170.4 lb

## 2019-11-02 DIAGNOSIS — Q230 Congenital stenosis of aortic valve: Secondary | ICD-10-CM

## 2019-11-02 DIAGNOSIS — Q231 Congenital insufficiency of aortic valve: Secondary | ICD-10-CM | POA: Diagnosis not present

## 2019-11-02 DIAGNOSIS — I712 Thoracic aortic aneurysm, without rupture: Secondary | ICD-10-CM

## 2019-11-02 DIAGNOSIS — I7121 Aneurysm of the ascending aorta, without rupture: Secondary | ICD-10-CM

## 2019-11-02 NOTE — Patient Instructions (Signed)
Medication Instructions:  Your physician recommends that you continue on your current medications as directed. Please refer to the Current Medication list given to you today.  *If you need a refill on your cardiac medications before your next appointment, please call your pharmacy*    Testing/Procedures: Your physician has requested that you have an echocardiogram in 1 year. Echocardiography is a painless test that uses sound waves to create images of your heart. It provides your doctor with information about the size and shape of your heart and how well your heart's chambers and valves are working. This procedure takes approximately one hour. There are no restrictions for this procedure.   Follow-Up: At [facility], you and your health needs are our priority.  As part of our continuing mission to provide you with exceptional heart care, we have created designated Provider Care Teams.  These Care Teams include your primary Cardiologist (physician) and Advanced Practice Providers (APPs -  Physician Assistants and Nurse Practitioners) who all work together to provide you with the care you need, when you need it.  We recommend signing up for the patient portal called "MyChart".  Sign up information is provided on this After Visit Summary.  MyChart is used to connect with patients for Virtual Visits (Telemedicine).  Patients are able to view lab/test results, encounter notes, upcoming appointments, etc.  Non-urgent messages can be sent to your provider as well.   To learn more about what you can do with MyChart, go to [email or web address].    Your next appointment:   12 month(s)  The format for your next appointment:   In Person  Provider:   You may see Quay Burow, MD or one of the following Advanced Practice Providers on your designated Care Team:    Kerin Ransom, PA-C  [place], [place]  Coletta Memos, [place]    Other Instructions Please call our office 2 months in advance  to schedule your follow-up appointment with Dr. Gwenlyn Found.

## 2019-11-02 NOTE — Progress Notes (Signed)
Charlene Carey  returns today for follow-up of her 2D echo performed in follow-up of her bicuspid aortic stenosis on 10/27/2019.  Her peak aortic gradient has increased from 36 mmHg back in 2018 up 57 mmHg with a valve area of 0.87 cm.  She is completely asymptomatic.  Her aortic root measures 38 mm.  We will repeat 2D echo in 1 year after which I will see her back in follow-up.  Lorretta Harp, M.D., [place], [facility], [place] [address] [place], [place]  [address]  [phone] 11/02/2019 12:06 PM

## 2019-12-06 ENCOUNTER — Other Ambulatory Visit: Payer: Self-pay

## 2019-12-06 ENCOUNTER — Emergency Department
Admission: EM | Admit: 2019-12-06 | Discharge: 2019-12-06 | Disposition: A | Discharge status: Home / Self Care | Payer: No Typology Code available for payment source | Source: Home / Self Care

## 2019-12-06 DIAGNOSIS — R350 Frequency of micturition: Secondary | ICD-10-CM

## 2019-12-06 DIAGNOSIS — S30861A Insect bite (nonvenomous) of abdominal wall, initial encounter: Secondary | ICD-10-CM | POA: Diagnosis not present

## 2019-12-06 DIAGNOSIS — R6883 Chills (without fever): Secondary | ICD-10-CM

## 2019-12-06 DIAGNOSIS — W57XXXA Bitten or stung by nonvenomous insect and other nonvenomous arthropods, initial encounter: Secondary | ICD-10-CM | POA: Diagnosis not present

## 2019-12-06 LAB — POCT URINALYSIS DIP (MANUAL ENTRY)
Bilirubin, UA: NEGATIVE
Blood, UA: NEGATIVE
Glucose, UA: NEGATIVE mg/dL
Ketones, POC UA: NEGATIVE mg/dL
Leukocytes, UA: NEGATIVE
Nitrite, UA: NEGATIVE
Protein Ur, POC: NEGATIVE mg/dL
Spec Grav, UA: 1.02 (ref 1.010–1.025)
Urobilinogen, UA: 0.2 E.U./dL
pH, UA: 7 (ref 5.0–8.0)

## 2019-12-06 MED ORDER — DOXYCYCLINE HYCLATE 100 MG PO CAPS: 100.0000 mg | ORAL_CAPSULE | Freq: Two times a day (BID) | ORAL | 0 refills | Status: AC

## 2019-12-06 NOTE — ED Provider Notes (Signed)
[facility]    CSN: [id number] Arrival date & time: 12/06/19  1636      History   Chief Complaint Chief Complaint  Patient presents with  . Chills    HPI Charlene Carey is a 44 y.o. female.   HPI  Charlene Carey is a 44 y.o. female presenting to UC with c/o chills and low grade fever of 99.6*F since last night.  Mild low back pain and urinary frequency but she notes she was also bit by a tick on Right upper abdomen about 3 weeks ago.  She does not believe the tick was there for more than 24 hours, she woke up to find it on her and it was easy to remove but the area is still red and itchy.  She is concerned for possible Lyme's disease.  Denies body aches, cough, congestion, sore throat. She does have a mild HA.  No n/v/d. No sick contacts.    Past Medical History:  Diagnosis Date  . Allergy-induced asthma   . Aortic stenosis with bicuspid valve   . Ascending aortic aneurysm ([place])   . Frequent headaches   . Heartburn   . History of chicken pox   . History of gestational diabetes   . Multinodular goiter   . Seasonal allergies     Patient Active Problem List   Diagnosis Date Noted  . Ascending aortic aneurysm ([place])   . Multinodular goiter   . Atypical chest pain 10/16/2016  . Dyspnea on exertion 10/16/2016  . Aortic stenosis due to bicuspid aortic valve     Past Surgical History:  Procedure Laterality Date  . BREAST REDUCTION SURGERY    . OTHER SURGICAL HISTORY     had d& c  . RIGHT/LEFT HEART CATH AND CORONARY ANGIOGRAPHY N/A 11/18/2016   Procedure: Right/Left Heart Cath and Coronary Angiography;  Surgeon: Lorretta Harp, MD;  Location: [facility];  Service: Cardiovascular;  Laterality: N/A;  . TEE WITHOUT CARDIOVERSION N/A 01/27/2017   Procedure: TRANSESOPHAGEAL ECHOCARDIOGRAM (TEE);  Surgeon: Sanda Klein, MD;  Location: [facility];  Service: Cardiovascular;  Laterality: N/A;    OB History   No obstetric history on file.       Home Medications    Prior to Admission medications   Medication Sig Start Date End Date Taking? Authorizing Provider  doxycycline (VIBRAMYCIN) 100 MG capsule Take 1 capsule (100 mg total) by mouth 2 (two) times daily for 10 days. 12/06/19 12/16/19  Noe Gens, PA-C    Family History Family History  Problem Relation Age of Onset  . Hypertension Mother   . Thyroid disease Mother        Hypothyroid  . Hypertension Maternal Grandfather   . Stroke Maternal Grandfather   . Hyperlipidemia Maternal Grandfather   . Diabetes Father     Social History Social History   Tobacco Use  . Smoking status: Former Research scientist (life sciences)  . Smokeless tobacco: Never Used  Vaping Use  . Vaping Use: Never used  Substance Use Topics  . Alcohol use: Yes    Alcohol/week: 1.0 standard drink    Types: 1 Standard drinks or equivalent per week    Comment: depends on day   . Drug use: No     Allergies   Codeine   Review of Systems Review of Systems  Constitutional: Positive for chills and fever (low grade (99.6)).  HENT: Negative for congestion, ear pain, sore throat, trouble swallowing and voice change.   Respiratory:  Negative for cough and shortness of breath.   Cardiovascular: Negative for chest pain and palpitations.  Gastrointestinal: Negative for abdominal pain, diarrhea, nausea and vomiting.  Genitourinary: Positive for frequency. Negative for dysuria and urgency.  Musculoskeletal: Positive for back pain. Negative for arthralgias and myalgias.  Skin: Positive for color change. Negative for rash.  Neurological: Positive for headaches. Negative for dizziness and light-headedness.  All other systems reviewed and are negative.    Physical Exam Triage Vital Signs ED Triage Vitals  Enc Vitals Group     BP 12/06/19 1653 136/89     Pulse Rate 12/06/19 1653 71     Resp 12/06/19 1653 18     Temp 12/06/19 1653 99.2 F (37.3 C)     Temp Source 12/06/19 1653 Oral     SpO2 12/06/19 1653 100 %      Weight --      Height --      Head Circumference --      Peak Flow --      Pain Score 12/06/19 1652 0     Pain Loc --      Pain Edu? --      Excl. in [place]? --    No data found.  Updated Vital Signs BP 136/89 (BP Location: Right Arm)   Pulse 71   Temp 99.2 F (37.3 C) (Oral)   Resp 18   SpO2 100%   Visual Acuity Right Eye Distance:   Left Eye Distance:   Bilateral Distance:    Right Eye Near:   Left Eye Near:    Bilateral Near:     Physical Exam Vitals and nursing note reviewed.  Constitutional:      General: She is not in acute distress.    Appearance: Normal appearance. She is well-developed. She is not ill-appearing, toxic-appearing or diaphoretic.  HENT:     Head: Normocephalic and atraumatic.     Right Ear: Tympanic membrane and ear canal normal.     Left Ear: Tympanic membrane and ear canal normal.     Nose: Nose normal.     Mouth/Throat:     Mouth: Mucous membranes are moist.     Pharynx: Oropharynx is clear.  Cardiovascular:     Rate and Rhythm: Normal rate and regular rhythm.  Pulmonary:     Effort: Pulmonary effort is normal. No respiratory distress.     Breath sounds: Normal breath sounds.  Abdominal:     General: There is no distension.     Tenderness: There is no abdominal tenderness. There is no right CVA tenderness or left CVA tenderness.    Musculoskeletal:        General: Normal range of motion.     Cervical back: Normal range of motion.  Skin:    General: Skin is warm and dry.  Neurological:     Mental Status: She is alert and oriented to person, place, and time.  Psychiatric:        Behavior: Behavior normal.      UC Treatments / Results  Labs (all labs ordered are listed, but only abnormal results are displayed) Labs Reviewed  POCT URINALYSIS DIP (MANUAL ENTRY)    EKG   Radiology No results found.  Procedures Procedures (including critical care time)  Medications Ordered in UC Medications - No data to display  Initial  Impression / Assessment and Plan / UC Course  I have reviewed the triage vital signs and the nursing notes.  Pertinent labs & imaging  results that were available during my care of the patient were reviewed by me and considered in my medical decision making (see chart for details).     Due to back pain, chills and urinary frequency, UA performed to r/o UTI  Duration of possible tick attachment- low risk for Lymes, RMSF more likely but still unlikely given short duration of attachment and 3 weeks post bite for symptom onset. Because of pt's cardiac hx, will treat for potential tick borne disease.  Encouraged close monitoring of tick bite area and symptoms F/u with PCP later this week if needed.  Final Clinical Impressions(s) / UC Diagnoses   Final diagnoses:  Urinary frequency  Chills  Tick bite of abdomen, initial encounter     Discharge Instructions      You may take 500mg  acetaminophen every 4-6 hours or in combination with ibuprofen 400-600mg  every 6-8 hours as needed for pain, inflammation, and fever.  Be sure to well hydrated with clear liquids and get at least 8 hours of sleep at night, preferably more while sick.   Please follow up with family medicine in 1 week if needed.  Please take antibiotics as prescribed and be sure to complete entire course even if you start to feel better to ensure infection does not come back.      ED Prescriptions    Medication Sig Dispense Auth. Provider   doxycycline (VIBRAMYCIN) 100 MG capsule Take 1 capsule (100 mg total) by mouth 2 (two) times daily for 10 days. 20 capsule Noe Gens, PA-C     PDMP not reviewed this encounter.   Noe Gens, [place] 12/06/19 1836

## 2019-12-06 NOTE — Discharge Instructions (Signed)
You may take 500mg acetaminophen every 4-6 hours or in combination with ibuprofen 400-600mg every 6-8 hours as needed for pain, inflammation, and fever. ° °Be sure to well hydrated with clear liquids and get at least 8 hours of sleep at night, preferably more while sick.  ° °Please follow up with family medicine in 1 week if needed. ° °Please take antibiotics as prescribed and be sure to complete entire course even if you start to feel better to ensure infection does not come back. ° °

## 2019-12-06 NOTE — ED Triage Notes (Signed)
Patient presents to Urgent Care with complaints of low grade fever (99.6) and chills since last night. Patient reports she had a tick bite about 3 weeks ago and is concerned with possible Lyme's disease and the need for antibiotics. Pt states the tick bite area is still itchy, has a "blister-y thing going on". Wound appears to have scarred over, no discharge, some redness.

## 2020-09-25 LAB — HM PAP SMEAR: HM Pap smear: NEGATIVE

## 2020-10-24 ENCOUNTER — Other Ambulatory Visit: Payer: Self-pay | Admitting: Obstetrics & Gynecology

## 2020-10-24 DIAGNOSIS — Z9189 Other specified personal risk factors, not elsewhere classified: Secondary | ICD-10-CM

## 2020-10-31 ENCOUNTER — Ambulatory Visit
Admission: RE | Admit: 2020-10-31 | Discharge: 2020-10-31 | Disposition: A | Discharge status: Home / Self Care | Payer: No Typology Code available for payment source | Source: Ambulatory Visit | Attending: Obstetrics & Gynecology | Admitting: Obstetrics & Gynecology

## 2020-10-31 ENCOUNTER — Other Ambulatory Visit: Payer: Self-pay

## 2020-10-31 DIAGNOSIS — Z9189 Other specified personal risk factors, not elsewhere classified: Secondary | ICD-10-CM

## 2020-10-31 IMAGING — MR MR BREAST BILAT WO/W CM
8 of 12 series · 33 of 48 positions shown · IV contrast (10 mL Gadavist)
Comparison: Previous exam(s).

CLINICAL DATA: High risk screening breast MRI. Family history
breast cancer in mother at age 60, maternal aunt at age 50 and
maternal great grandmother at age 35. Previous bilateral reduction
mammoplasty at age 23.

LABS:  None.
EXAM:
BILATERAL BREAST MRI WITH AND WITHOUT CONTRAST
TECHNIQUE: Multiplanar, multisequence MR images of both breasts were obtained
prior to and following the intravenous administration of 10 ml of
Gadavist

[Series 2: t2_tirm_tra ipat (a-p) · axial · 3.0mm · 0.70mm/px · 1 of 58 slices shown]
[im 1/58]
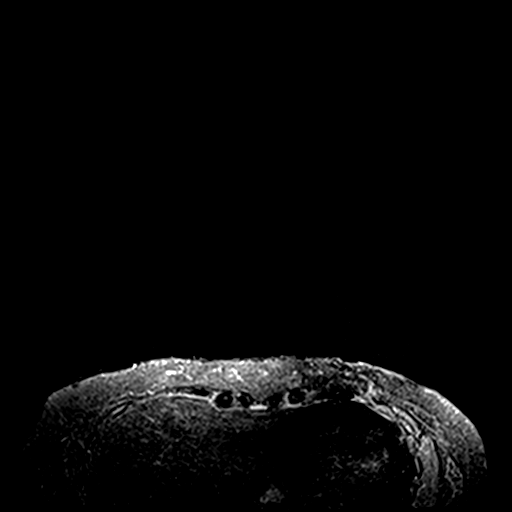

[Series 3: fl3d pre-cm no · axial · non-contrast · 1.2mm · 0.94mm/px · z∈[-78,+94]mm · 5 of 144 slices shown]
[im 1/144]
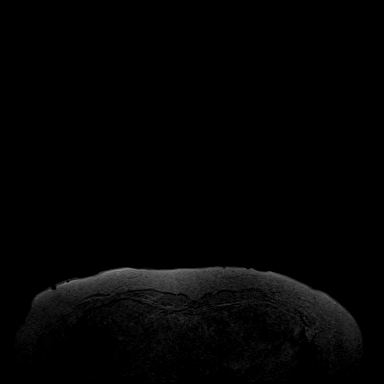
[im 36/144]
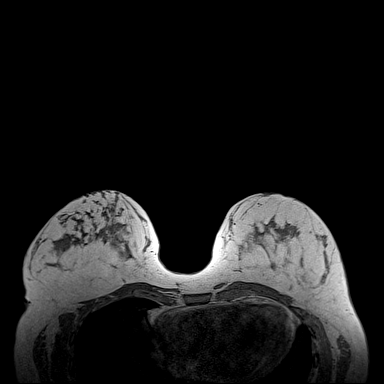
[im 72/144]
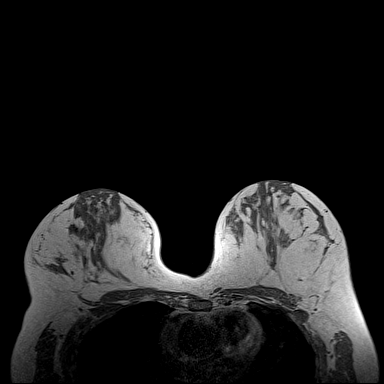
[im 108/144]
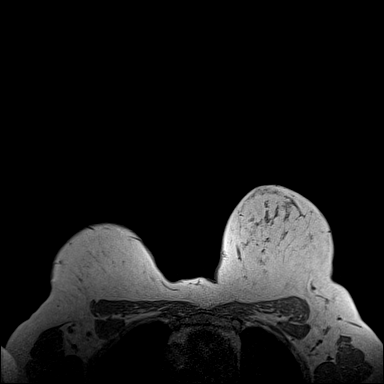
[im 144/144]
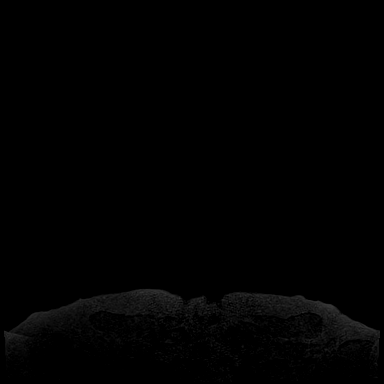

[Series 4: fl3d pre-cm · axial · non-contrast · 1.2mm · 0.94mm/px · z∈[-78,+94]mm · 5 of 144 slices shown]
[im 1/144]
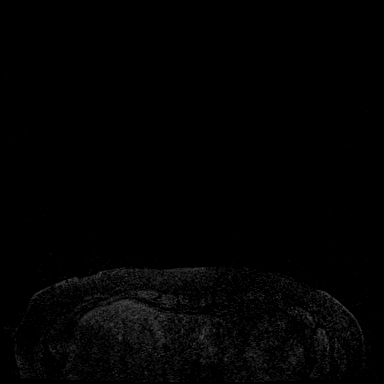
[im 36/144]
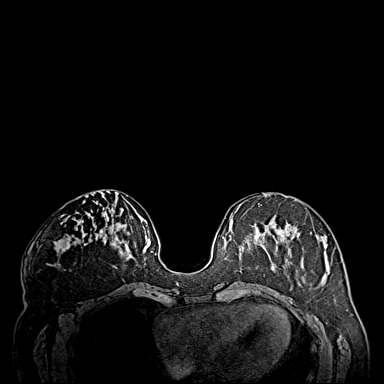
[im 72/144]
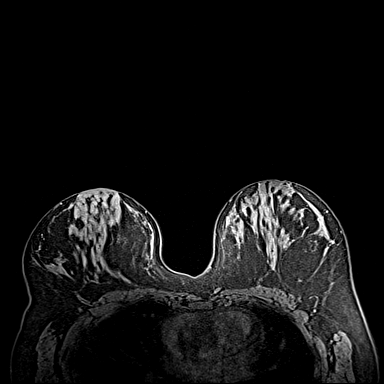
[im 108/144]
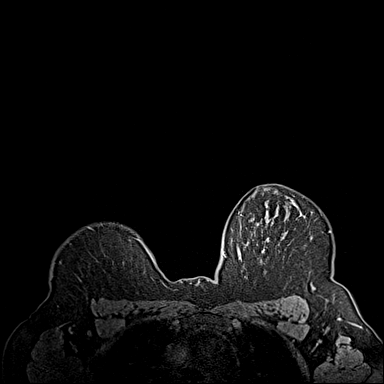
[im 144/144]
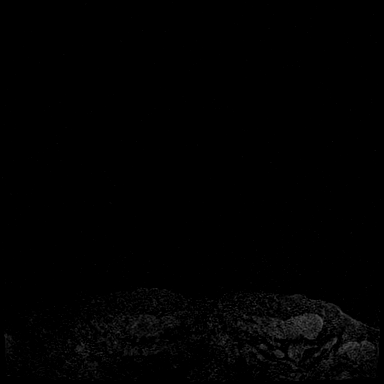

[Series 5: fl3d post immediate · axial · 1.2mm · 0.94mm/px · z∈[-78,+94]mm · 5 of 144 slices shown (1 of 3)]
[im 1/144]
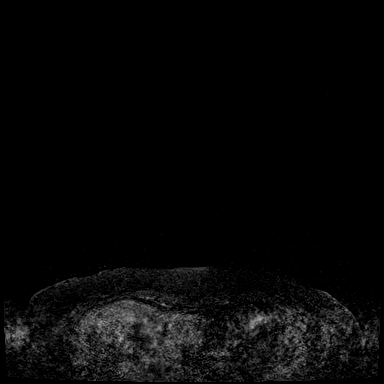
[im 36/144]
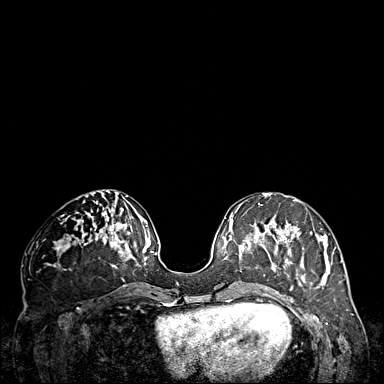
[im 72/144]
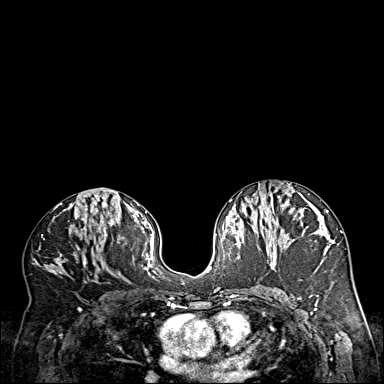
[im 108/144]
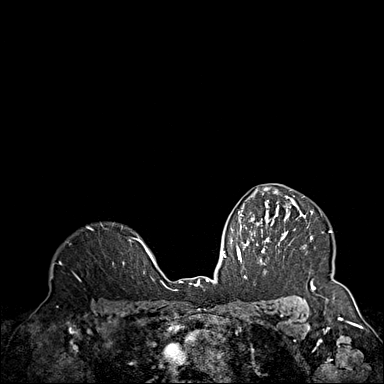
[im 144/144]
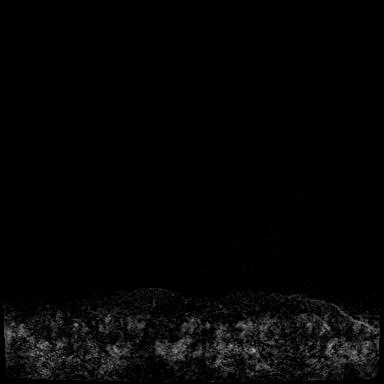

[Series 6: fl3d post immediate · axial · 1.2mm · 0.94mm/px · z∈[-78,+94]mm · 5 of 144 slices shown (2 of 3)]
[im 1/144]
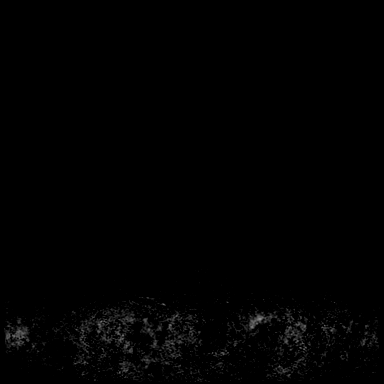
[im 36/144]
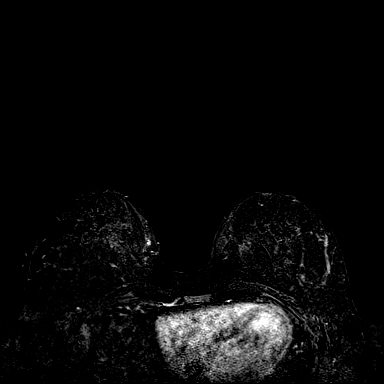
[im 72/144]
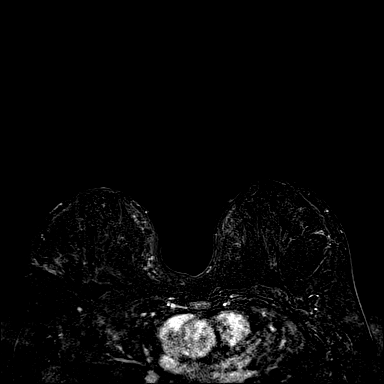
[im 108/144]
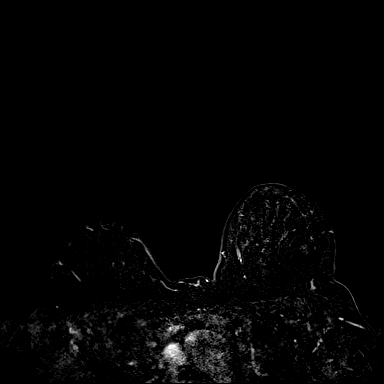
[im 144/144]
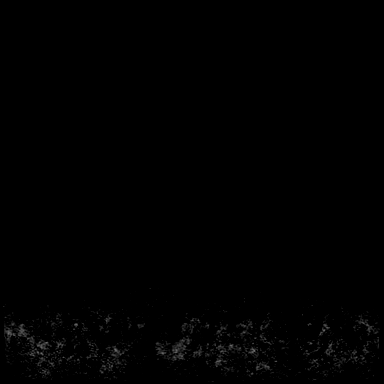

[Series 7: fl3d post immediate · axial · 172.8mm · 0.94mm/px · 1 of 1 slices shown (3 of 3)]
[im 1/1]
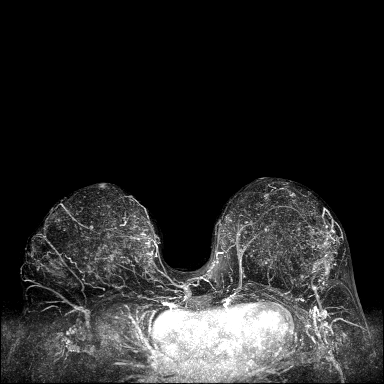

[Series 8: fl3d post 3min · axial · 1.2mm · 0.94mm/px · z∈[-78,+94]mm · 6 of 144 slices shown]
[im 1/144]
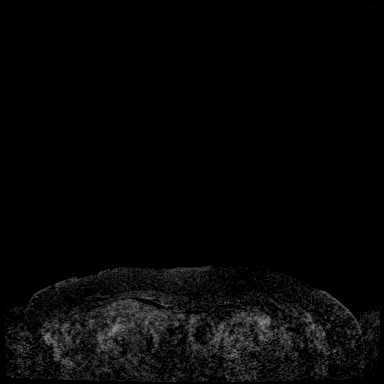
[im 29/144]
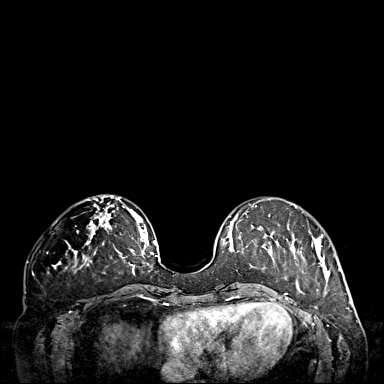
[im 58/144]
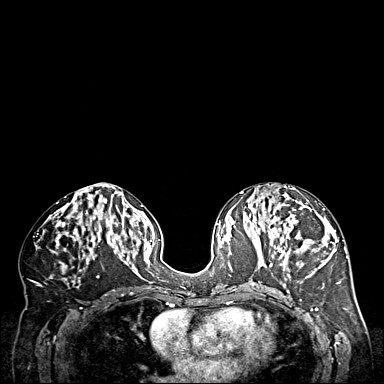
[im 86/144]
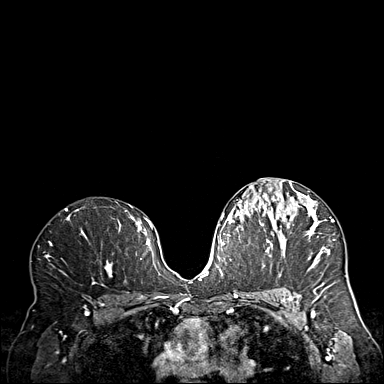
[im 115/144]
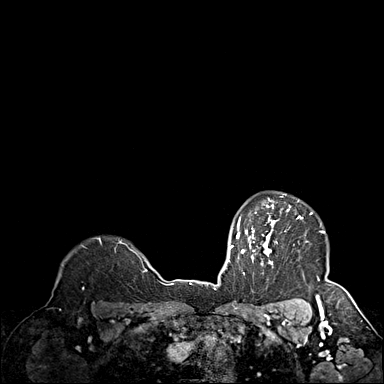
[im 144/144]
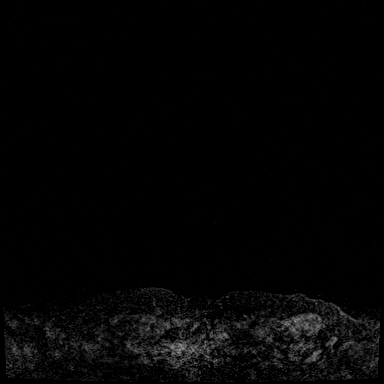

[Series 9: fl3d post 3min_sub · axial · 1.2mm · 0.94mm/px · z∈[-78,+59]mm · 5 of 144 slices shown]
[im 1/144]
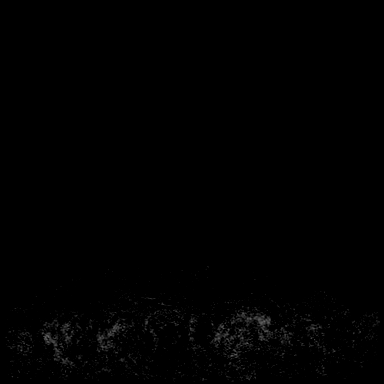
[im 29/144]
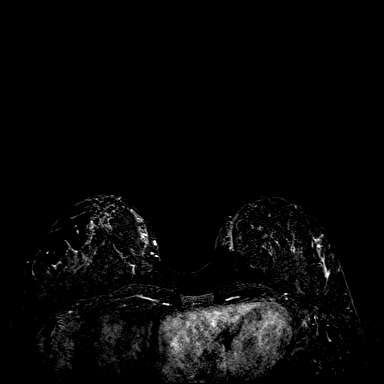
[im 58/144]
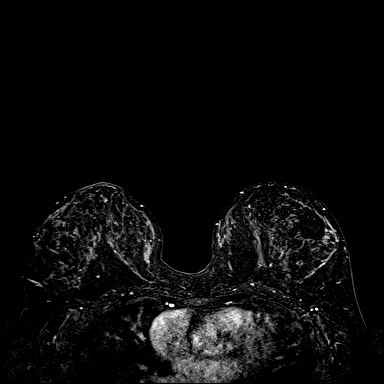
[im 86/144]
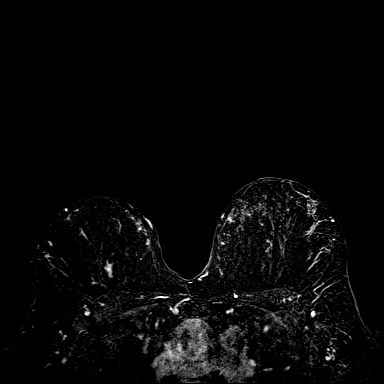
[im 115/144]
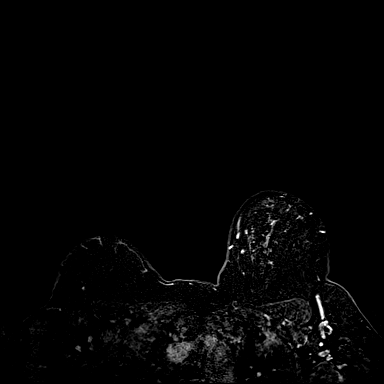

[33 of 48 positions shown; findings below may reference images not displayed]

Three-dimensional MR images were rendered by post-processing of the
original MR data on an independent workstation. The
three-dimensional MR images were interpreted, and findings are
reported in the following complete MRI report for this study. Three
dimensional images were evaluated at the independent interpreting
workstation using the DynaCAD thin client.
FINDINGS: Breast composition: c. Heterogeneous fibroglandular tissue.

Background parenchymal enhancement: Moderate.

Right breast: There is focal linear non mass enhancement over the
middle to posterior third of the upper central breast measuring 4 x
1.4 mm (image 58, series 9). Couple tiny right breast cysts.

Left breast: No mass or abnormal enhancement. Few tiny subcentimeter
cysts are present.

Lymph nodes: No abnormal appearing lymph nodes.

Ancillary findings:  None.
IMPRESSION: 1. Indeterminate 1.4 cm linear non mass enhancement over the middle
to posterior third of the upper central right breast.

2.  No suspicious findings within the left breast.

RECOMMENDATION:
Recommend MRI guided biopsy of this indeterminate right breast
abnormality.

BI-RADS CATEGORY  4: Suspicious.

## 2020-10-31 MED ORDER — GADOBUTROL 1 MMOL/ML IV SOLN
10.0000 mL | Freq: Once | INTRAVENOUS | Status: AC | PRN
  Administered 2020-10-31: 10 mL via INTRAVENOUS

## 2020-11-07 ENCOUNTER — Telehealth: Payer: Self-pay | Admitting: *Deleted

## 2020-11-07 ENCOUNTER — Telehealth: Payer: Self-pay | Admitting: Cardiovascular Disease

## 2020-11-07 NOTE — Telephone Encounter (Signed)
I tried to reach the pt on her preferred # [phone] though vm not set up and could not leave a message. DPR ok to s/w pt's husband. Left very detailed message for the pt on her husbands cell phone. Per pre op provider the pt needs to be seen somewhere after her echo on 6/8 and her procedure fate of 6/20. I found an opening with Charlene Carey, [facility] 11/22/20 @ 11:45. Left message for the pt to please call the office to either confirm the appt date and time or if she needs to change the appt.

## 2020-11-07 NOTE — Telephone Encounter (Signed)
   Name: Charlene Carey  DOB: [birth year]  MRN: [id number]  Primary Cardiologist: Quay Burow, MD  Chart reviewed as part of pre-operative protocol coverage. Because of [name]'s past medical history and time since last visit, she will require a follow-up visit in order to better assess preoperative cardiovascular risk. She is scheduled for a repeat echocardiogram 11/15/20, therefore would try to arrange follow-up between then and her scheduled procedure 11/27/20.   Pre-op covering staff: - Please schedule appointment and call patient to inform them.Please add "pre-op clearance" to the appointment notes so provider is aware. - Please contact requesting surgeon's office via preferred method (i.e, phone, fax) to inform them of need for appointment prior to surgery.   Abigail Butts, PA-C  11/07/2020, 4:14 PM

## 2020-11-07 NOTE — Telephone Encounter (Signed)
   [facility] Pre-operative Risk Assessment    Patient Name: Charlene Carey  DOB: 1975/11/25  MRN: [id number]   HEARTCARE STAFF: - Please ensure there is not already an duplicate clearance open for this procedure. - Under Visit Info/Reason for Call, type in Other and utilize the format Clearance MM/DD/YY or Clearance TBD. Do not use dashes or single digits. - If request is for dental extraction, please clarify the # of teeth to be extracted. - If the patient is currently at the dentist's office, call Pre-Op APP to address. If the patient is not currently in the dentist office, please route to the Pre-Op pool  Request for surgical clearance:  1. What type of surgery is being performed? colonoscopy   2. When is this surgery scheduled? 11/27/20   3. What type of clearance is required (medical clearance vs. Pharmacy clearance to hold med vs. Both)? medical  4. Are there any medications that need to be held prior to surgery and how long?none   5. Practice name and name of physician performing surgery? [facility]   6. What is the office phone number? [phone]   7.   What is the office fax number? [phone]  8.   Anesthesia type (None, local, MAC, general) ? propofol   Fredia Beets 11/07/2020, 12:45 PM  _________________________________________________________________   (provider comments below)

## 2020-11-07 NOTE — Telephone Encounter (Signed)
    Pt called back, per notes need to provide appt time and date for pre op clearance. Gave pt info. No other questions.

## 2020-11-08 ENCOUNTER — Other Ambulatory Visit: Payer: Self-pay | Admitting: Obstetrics & Gynecology

## 2020-11-08 ENCOUNTER — Other Ambulatory Visit: Payer: Self-pay | Admitting: *Deleted

## 2020-11-08 DIAGNOSIS — R9389 Abnormal findings on diagnostic imaging of other specified body structures: Secondary | ICD-10-CM

## 2020-11-08 NOTE — Telephone Encounter (Signed)
error 

## 2020-11-09 NOTE — Telephone Encounter (Signed)
I s/w the pt and confirmed with her, her appt 11/22/20 @ 11:45 for pre op clearance . Pt states she does not understand why the appt was needed, since she has her echo on 6/8 and why couldn't the cardiologist clear her with a phone call. I explained to her that pre op protocol is that if the pt has not been seen >8yr they will require an appt. I did also explain that she will have an EKG done which will show her heart rate and heart rhythm, which with the echo will give the provider the tools needed to help make an informed assessment for pre op clearance. Pt states she is just so booked up with appts. Pt asked to not have the August appt with Dr. Gwenlyn Found cancelled as she has a CT in July and he will want to see her for follow up. I will send clearance notes to [facility] for upcoming appt. Will send message to requesting office pt has appt.

## 2020-11-15 ENCOUNTER — Ambulatory Visit (HOSPITAL_COMMUNITY): Payer: No Typology Code available for payment source | Attending: Cardiovascular Disease

## 2020-11-15 ENCOUNTER — Inpatient Hospital Stay: Admission: RE | Admit: 2020-11-15 | Payer: No Typology Code available for payment source | Source: Ambulatory Visit

## 2020-11-15 ENCOUNTER — Other Ambulatory Visit: Payer: Self-pay

## 2020-11-15 DIAGNOSIS — Q2381 Bicuspid aortic valve: Secondary | ICD-10-CM

## 2020-11-15 DIAGNOSIS — Q230 Congenital stenosis of aortic valve: Secondary | ICD-10-CM

## 2020-11-15 DIAGNOSIS — I712 Thoracic aortic aneurysm, without rupture: Secondary | ICD-10-CM | POA: Diagnosis present

## 2020-11-15 DIAGNOSIS — I7121 Aneurysm of the ascending aorta, without rupture: Secondary | ICD-10-CM

## 2020-11-15 DIAGNOSIS — Q231 Congenital insufficiency of aortic valve: Secondary | ICD-10-CM | POA: Diagnosis not present

## 2020-11-15 LAB — ECHOCARDIOGRAM COMPLETE
AR max vel: 0.78 cm2
AV Area VTI: 0.83 cm2
AV Area mean vel: 0.76 cm2
AV Mean grad: 31.1 mmHg
AV Peak grad: 53.3 mmHg
Ao pk vel: 3.65 m/s
Area-P 1/2: 2.8 cm2
S' Lateral: 2.7 cm

## 2020-11-16 ENCOUNTER — Ambulatory Visit
Admission: RE | Admit: 2020-11-16 | Discharge: 2020-11-16 | Disposition: A | Discharge status: Home / Self Care | Payer: No Typology Code available for payment source | Source: Ambulatory Visit | Attending: Obstetrics & Gynecology | Admitting: Obstetrics & Gynecology

## 2020-11-16 ENCOUNTER — Other Ambulatory Visit: Payer: Self-pay | Admitting: Radiology

## 2020-11-16 DIAGNOSIS — R9389 Abnormal findings on diagnostic imaging of other specified body structures: Secondary | ICD-10-CM

## 2020-11-16 IMAGING — MR MR BREAST BX W/ LOC DEV 1ST LEASION IMAGE BX SPEC MR GUIDE*R*
8 of 12 series · 32 of 48 positions shown · IV contrast (8 ml gadavist)
Comparison: Previous exams.
COMPARISON: Previous exams.

Addendum:
CLINICAL DATA: Biopsy of non mass enhancement in the right breast.

EXAM:
MRI GUIDED CORE NEEDLE BIOPSY OF THE RIGHT BREAST
TECHNIQUE: Multiplanar, multisequence MR imaging of the right breast was
performed both before and after administration of intravenous
contrast.
CONTRAST:  10 mL of Gadavist

[Series 2: fiducial unilateral · sagittal · 2.0mm · 1.33mm/px · 3 of 52 slices shown]
[im 1/52]
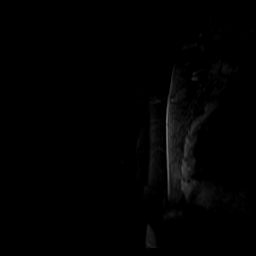
[im 26/52]
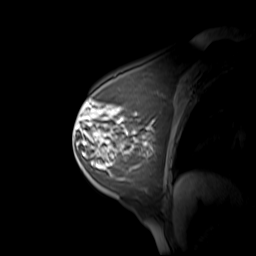
[im 52/52]
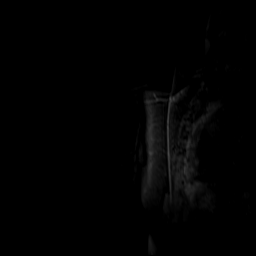

[Series 3: dynamic pre · axial · non-contrast · 1.3mm · 0.73mm/px · z∈[-100,+86]mm · 5 of 144 slices shown]
[im 1/144]
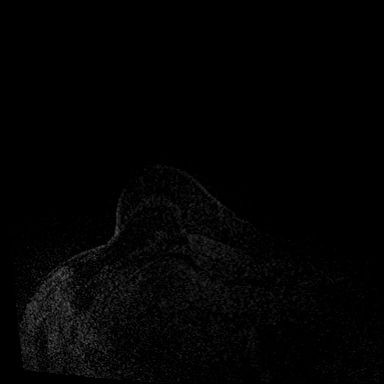
[im 36/144]
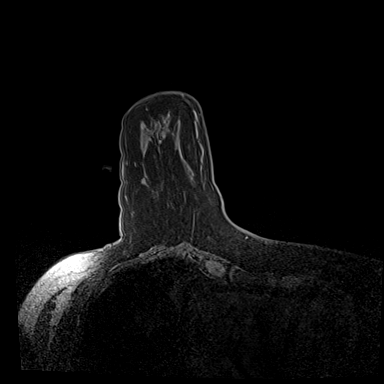
[im 72/144]
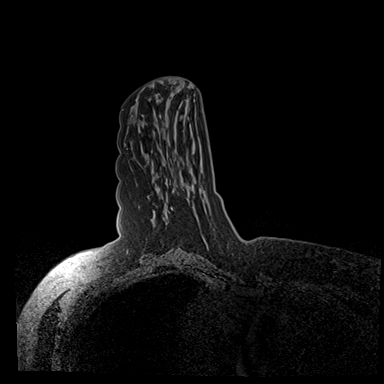
[im 108/144]
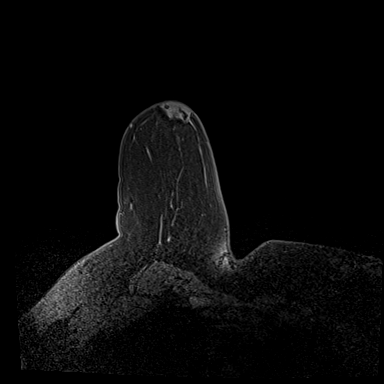
[im 144/144]
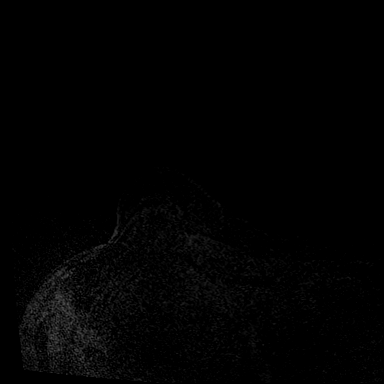

[Series 4: dynamic post 20 · axial · 1.3mm · 0.73mm/px · z∈[-100,+86]mm · 4 of 144 slices shown (1 of 2)]
[im 1/144]
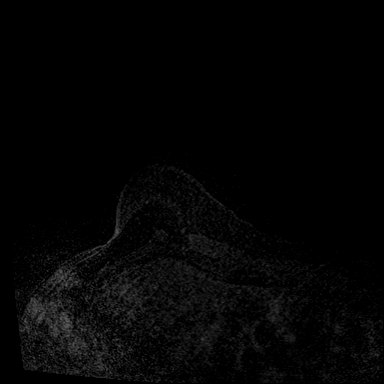
[im 48/144]
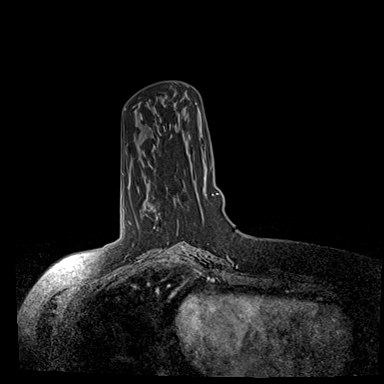
[im 96/144]
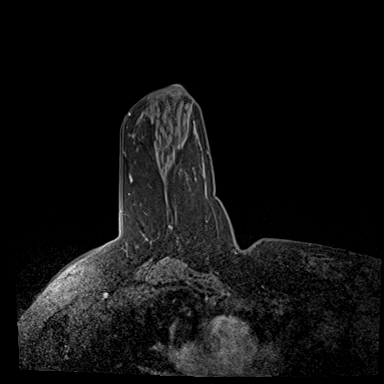
[im 144/144]
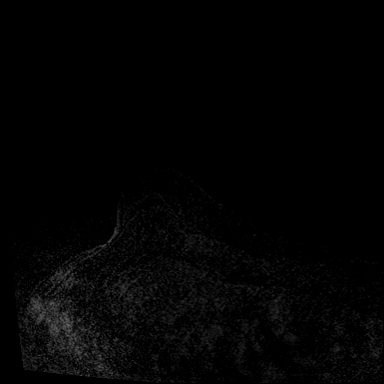

[Series 5: dynamic post 20 · axial · 1.3mm · 0.73mm/px · z∈[-100,+86]mm · 4 of 144 slices shown (2 of 2)]
[im 1/144]
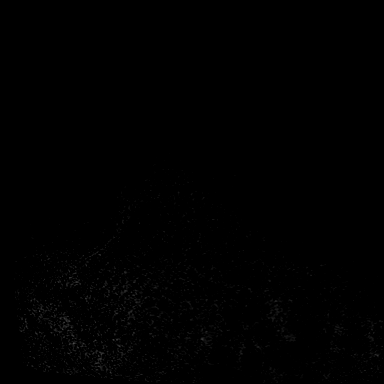
[im 48/144]
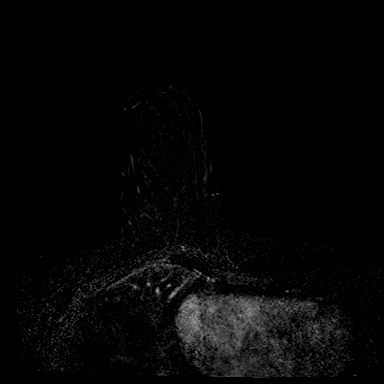
[im 96/144]
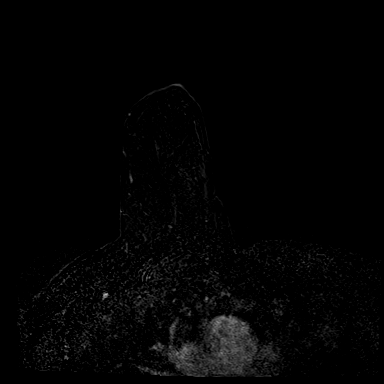
[im 144/144]
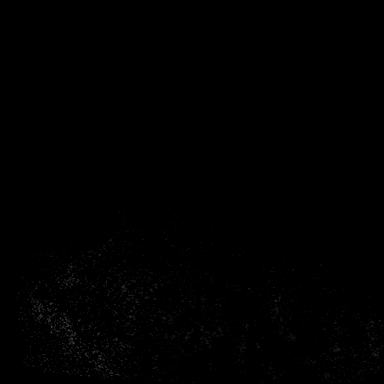

[Series 6: dynamic post 3 · axial · 1.3mm · 0.73mm/px · z∈[-100,+86]mm · 4 of 144 slices shown (1 of 2)]
[im 1/144]
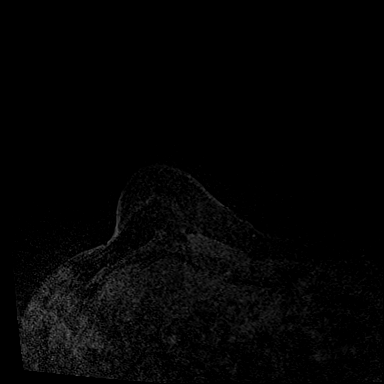
[im 48/144]
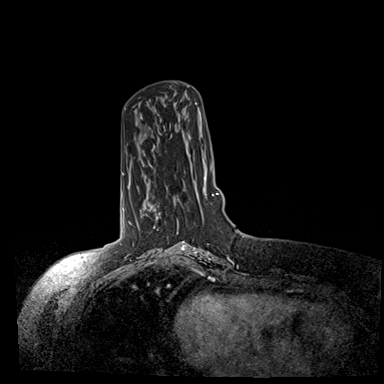
[im 96/144]
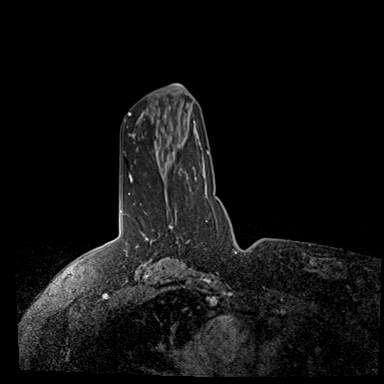
[im 144/144]
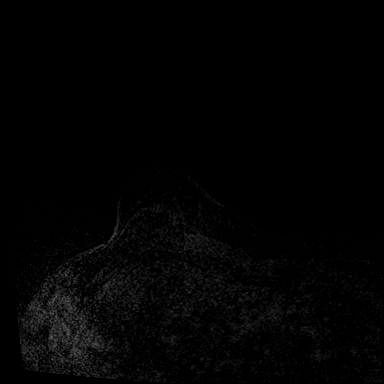

[Series 7: dynamic post 3 · axial · 1.3mm · 0.73mm/px · z∈[-100,+86]mm · 4 of 144 slices shown (2 of 2)]
[im 1/144]
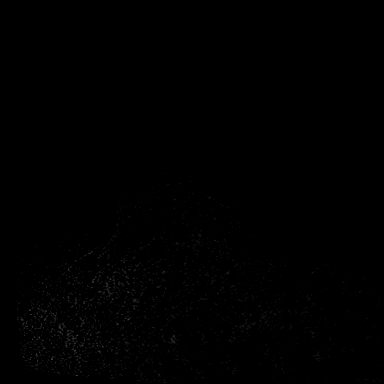
[im 48/144]
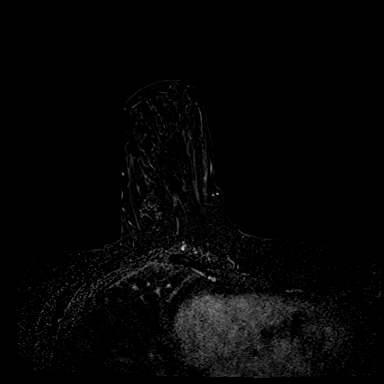
[im 96/144]
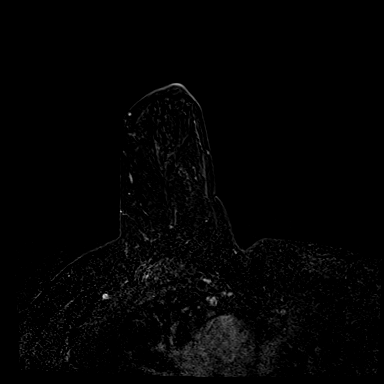
[im 144/144]
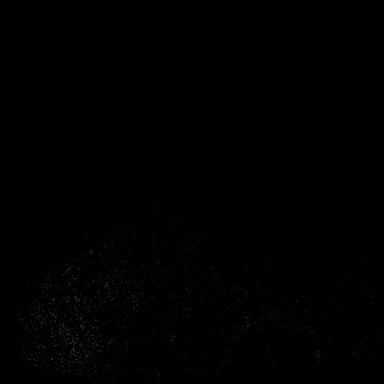

[Series 8: dynamic post 5min · axial · 1.3mm · 0.73mm/px · z∈[-100,+86]mm · 4 of 144 slices shown]
[im 1/144]
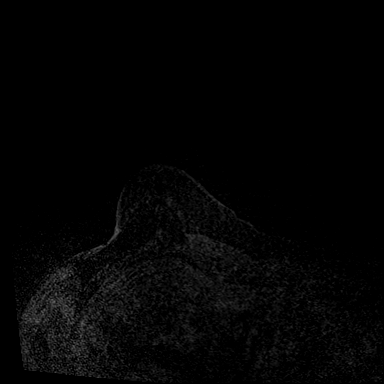
[im 48/144]
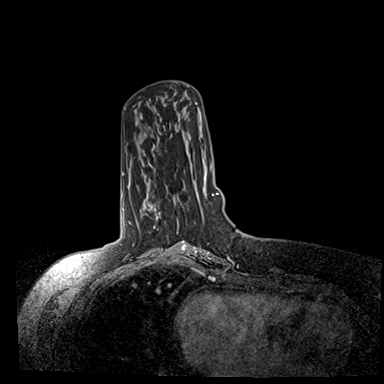
[im 96/144]
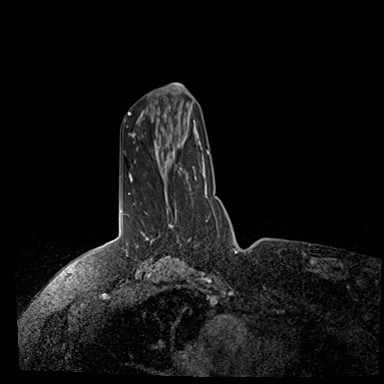
[im 144/144]
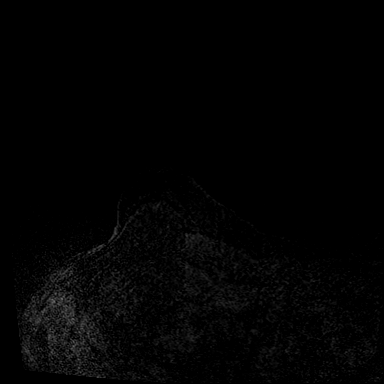

[Series 9: dynamic post 5min_sub · axial · 1.3mm · 0.73mm/px · z∈[-100,+86]mm · 4 of 144 slices shown]
[im 1/144]
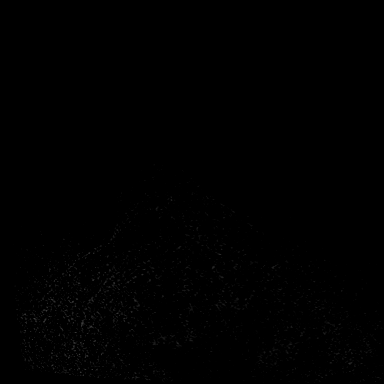
[im 48/144]
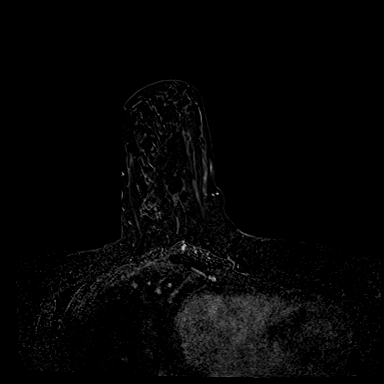
[im 96/144]
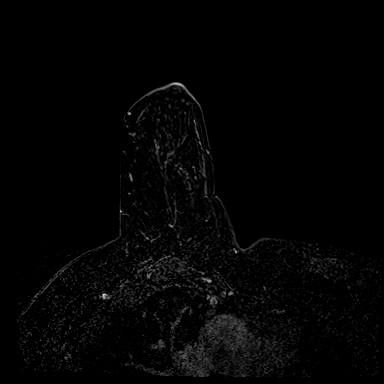
[im 144/144]
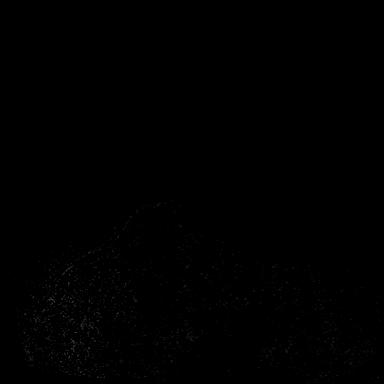

[32 of 48 positions shown; findings below may reference images not displayed]

FINDINGS: I met with the patient, and we discussed the procedure of MRI guided
biopsy, including risks, benefits, and alternatives. Specifically,
we discussed the risks of infection, bleeding, tissue injury, clip
migration, and inadequate sampling. Informed, written consent was
given. The usual time out protocol was performed immediately prior
to the procedure.

Using sterile technique, 1% Lidocaine, MRI guidance, and a 9 gauge
vacuum assisted device, biopsy was performed of non mass enhancement
in the superior right breast using a lateral approach. At the
conclusion of the procedure, a a barbell shaped tissue marker clip
was deployed into the biopsy cavity. Follow-up 2-view mammogram was
performed and dictated separately.
IMPRESSION: MRI guided biopsy of non mass enhancement in the superior right
breast. No apparent complications.

ADDENDUM:
Pathology revealed PSEUDOANGIOMATOUS STROMAL HYPERPLASIA of the
Right breast, superior. This was found to be concordant by Dr. [facility]EHAT
[facility].

Pathology results were discussed with the patient by telephone. The
patient reported doing well after the biopsy with tenderness at the
site. Post biopsy instructions and care were reviewed and questions
were answered. The patient was encouraged to call The [REDACTED]

The patient was instructed to return for a bilateral breast MRI in 6
months, per protocol, and to continue with annual screening
mammograms at [facility] in [HOSPITAL][HOSPITAL].

Pathology results reported by SIBEHAT, RN on [DATE].

The patient returns today to evaluate the RIGHT breast after biopsy.
She reports seeing a pink area with streaks in the LATERAL aspect of
the RIGHT breast, associated palpable fullness when she changed
clothes this morning. She denies itching at the site.

On physical exam, [facility] are intact and clean. There is
approximately 7 centimeters of erythema and raised, intact skin
surrounding the biopsy site. I palpate soft fullness in the same
region. The Band-Aid was removed. There is a small area of
erythema/induration associated with the adhesive portion of the
Band-Aid.

Findings are felt to be related to post biopsy changes, likely
edema/hematoma in early stages of healing. Less likely, the findings
could be related to allergic reaction to the cleanser. Infection is
felt to be unlikely.

The patient was encouraged to try a cortisone topical cream 2-3
times a day and to call back if symptoms fail to resolve.

I saw the patient with [facility], RN.

SIBEHAT, M.D.

*** End of Addendum ***
FINDINGS: I met with the patient, and we discussed the procedure of MRI guided
biopsy, including risks, benefits, and alternatives. Specifically,
we discussed the risks of infection, bleeding, tissue injury, clip
migration, and inadequate sampling. Informed, written consent was
given. The usual time out protocol was performed immediately prior
to the procedure.

Using sterile technique, 1% Lidocaine, MRI guidance, and a 9 gauge
vacuum assisted device, biopsy was performed of non mass enhancement
in the superior right breast using a lateral approach. At the
conclusion of the procedure, a a barbell shaped tissue marker clip
was deployed into the biopsy cavity. Follow-up 2-view mammogram was
performed and dictated separately.
IMPRESSION: MRI guided biopsy of non mass enhancement in the superior right
breast. No apparent complications.

## 2020-11-16 IMAGING — MG MM BREAST LOCALIZATION CLIP
4 series · 4 of 12 positions shown · non-contrast
Comparison: Previous exam(s).

CLINICAL DATA: Evaluate biopsy marker

EXAM:
DIAGNOSTIC RIGHT MAMMOGRAM POST MRI BIOPSY

[R CC synth-2D]
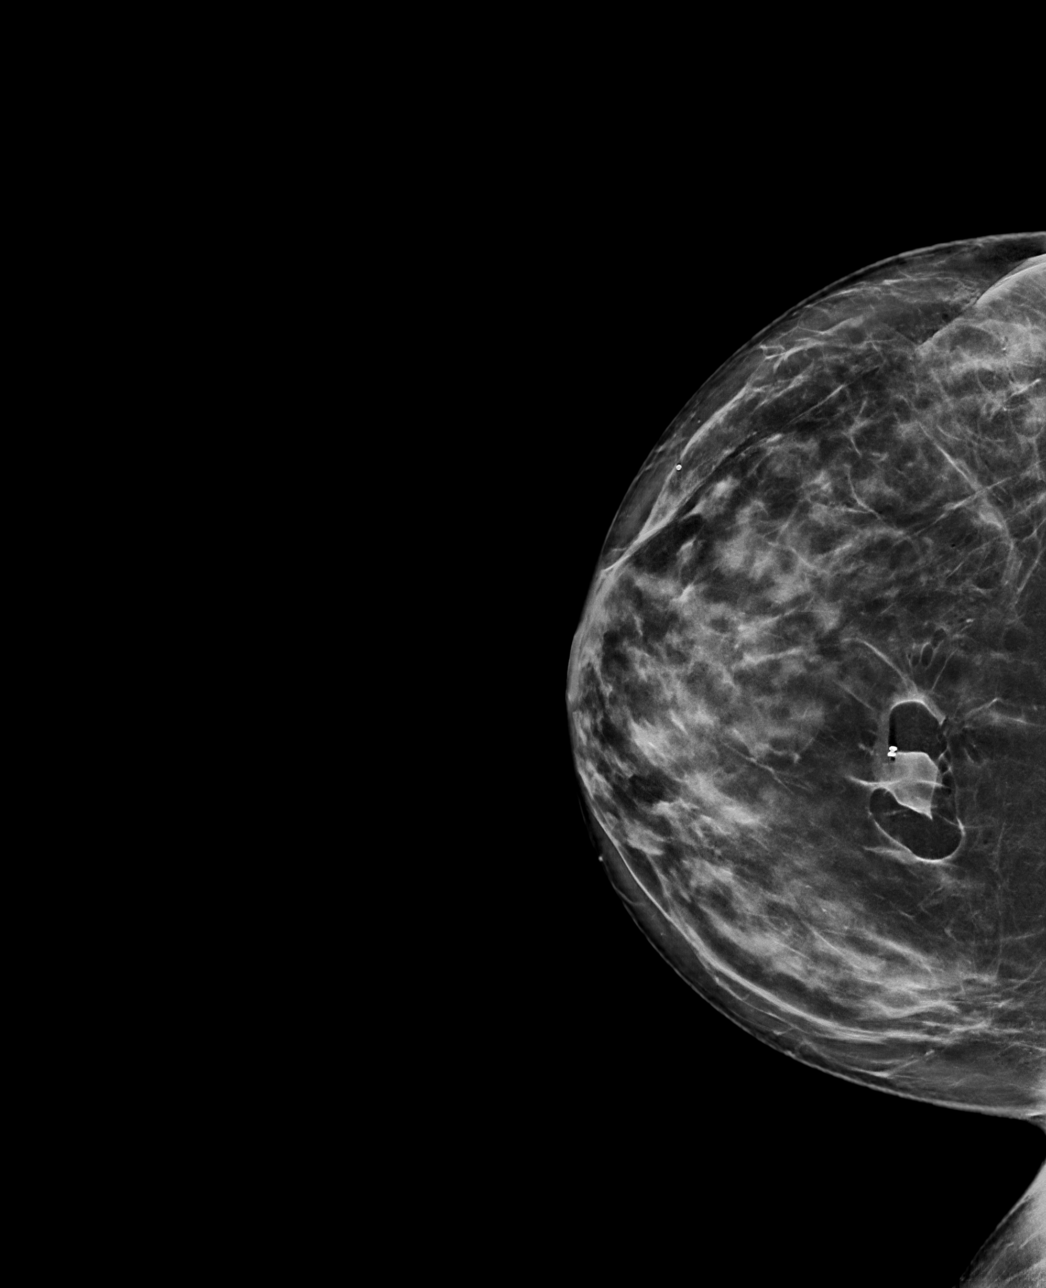

[R ML synth-2D]
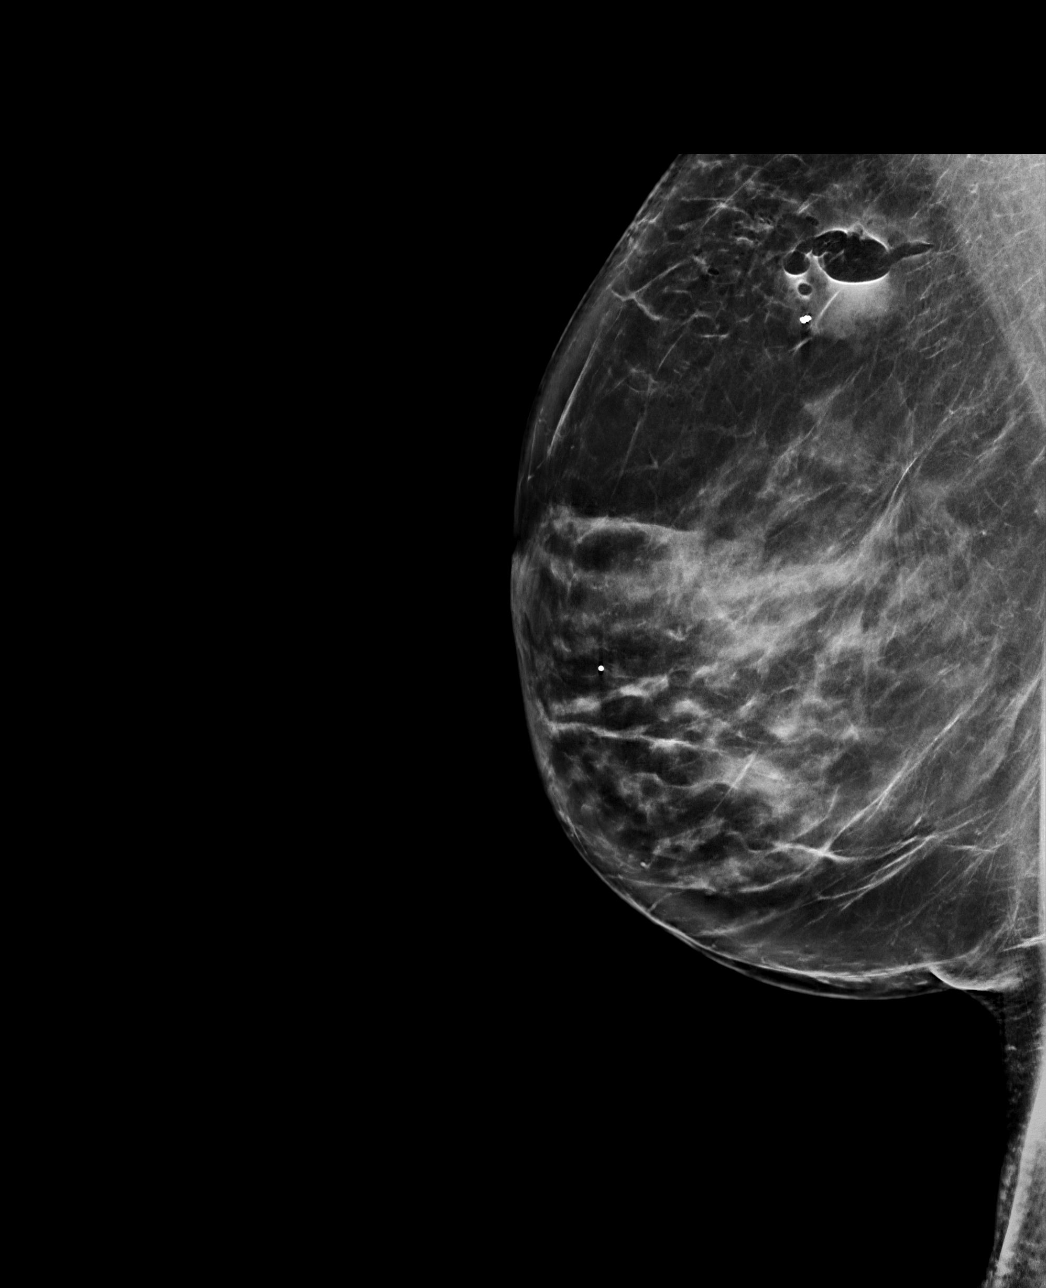

[R CC tomo · tomo slice 49/97.0]
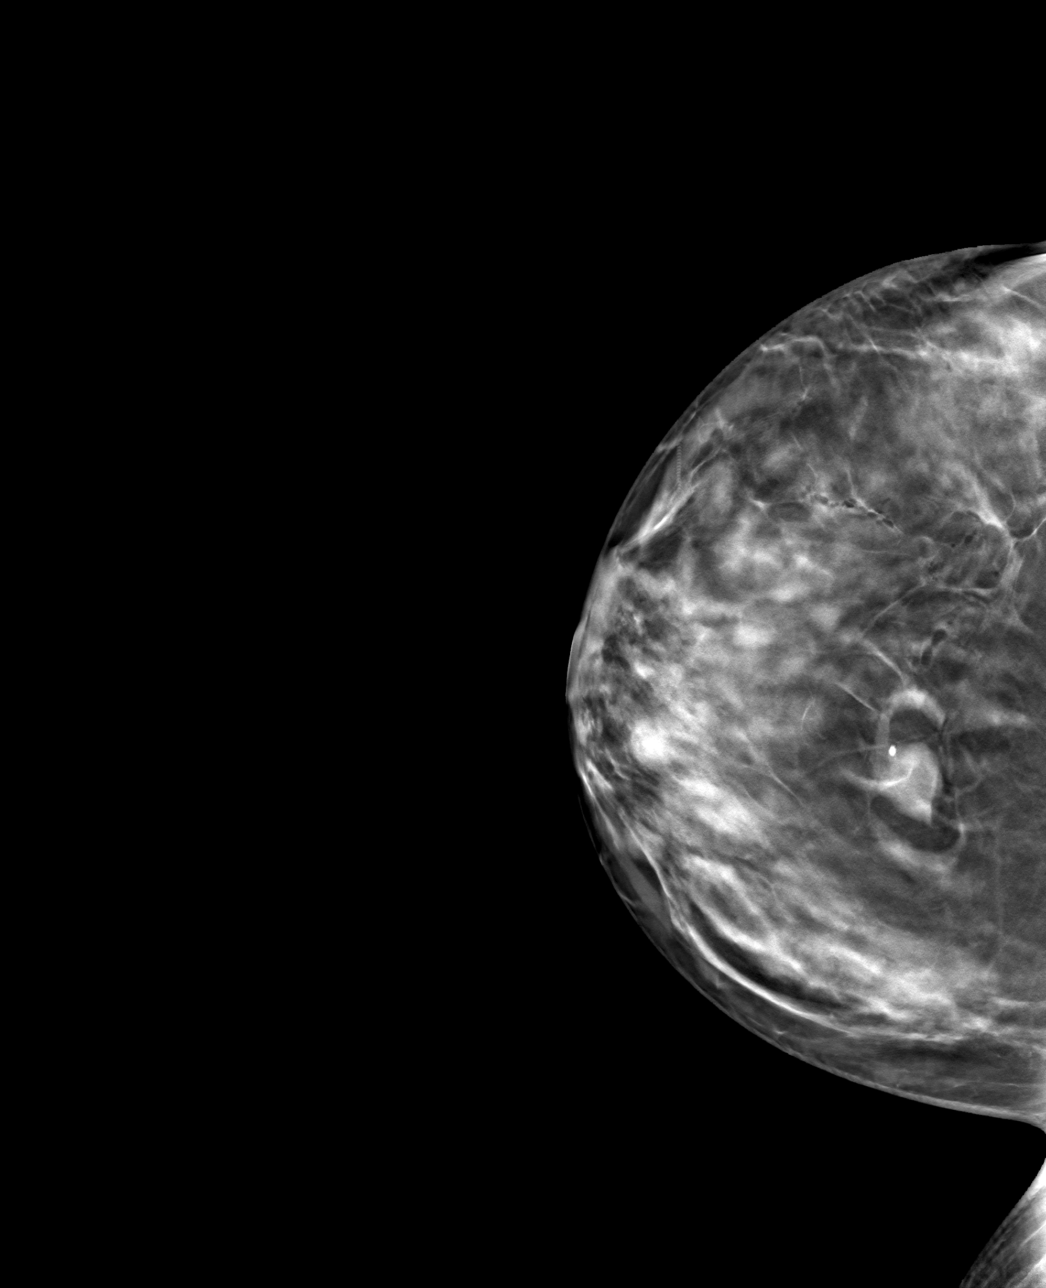

[R ML tomo · tomo slice 49/97.0]
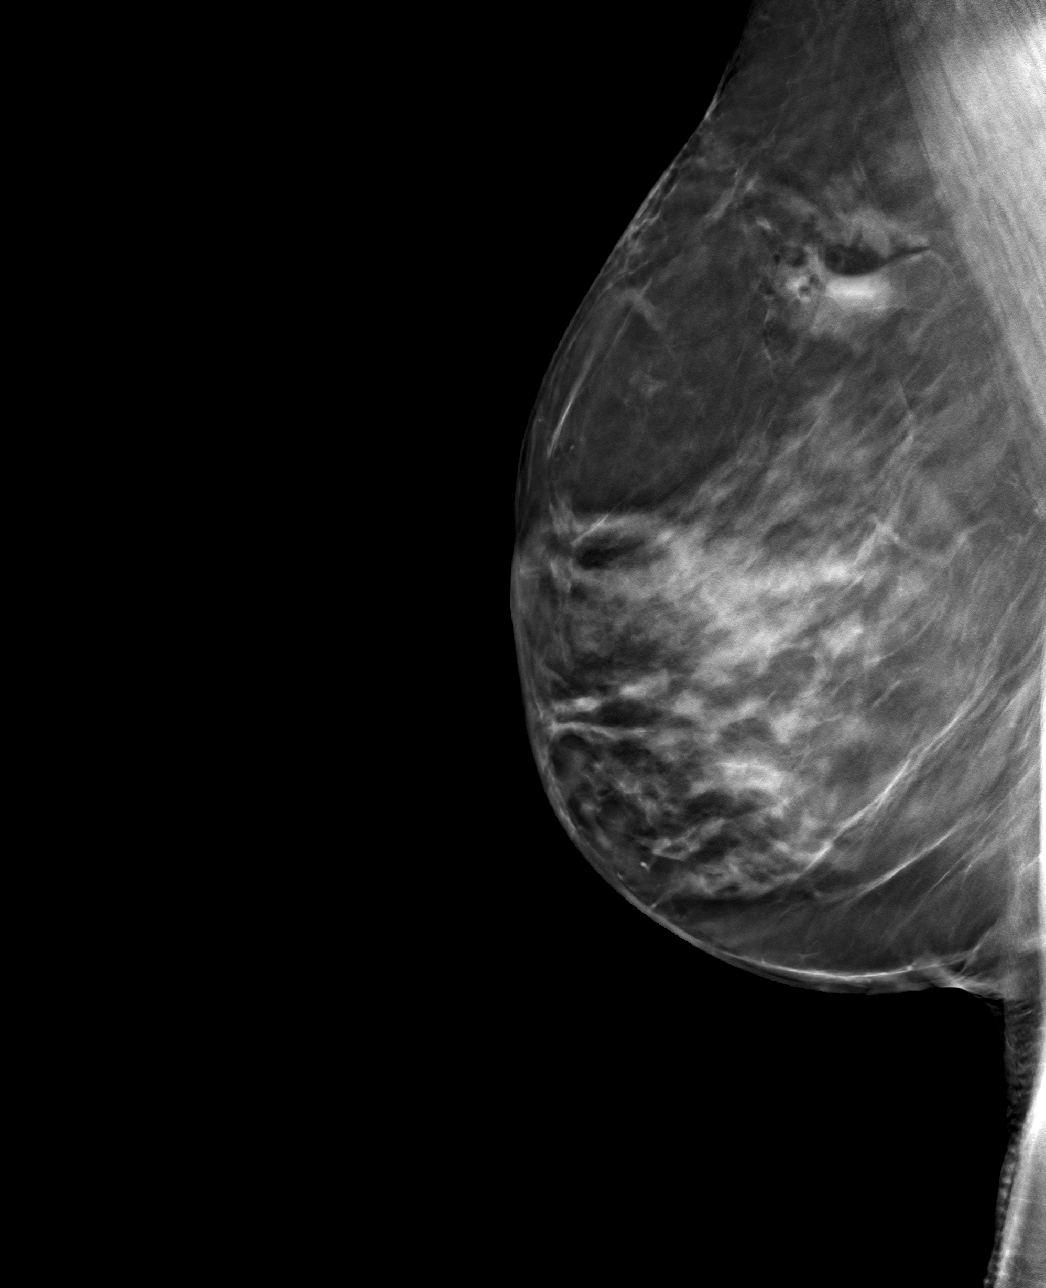

[4 of 12 positions shown; findings below may reference images not displayed]

FINDINGS: Mammographic images were obtained following MRI guided biopsy of
superior linear non mass enhancement in the right breast. The biopsy
marking clip is in expected position at the site of biopsy.
IMPRESSION: Appropriate positioning of the barbell shaped biopsy marking clip at
the site of biopsy in the location of the biopsied superior non mass
enhancement.

Final Assessment: Post Procedure Mammograms for Marker Placement

## 2020-11-16 MED ORDER — GADOBUTROL 1 MMOL/ML IV SOLN: 8.0000 mL | Freq: Once | INTRAVENOUS | Status: DC | PRN

## 2020-11-17 ENCOUNTER — Telehealth: Payer: Self-pay

## 2020-11-17 ENCOUNTER — Other Ambulatory Visit: Payer: No Typology Code available for payment source

## 2020-11-17 NOTE — Telephone Encounter (Signed)
Patient called the nurses at the [facility] site where her breast biopsy was done yesterday to report the site being puffy and red with "little fingers extending from the site like an octopus."  After also not being able to connect with a nurse at [facility], I called Dr. Theda Sers who is the [facility] physician today.  She took all of Charlene Carey's info and stated she would give it to the nurses herself, possibly to have the patient brought in to be seen.  I lmom with pt with this update.

## 2020-11-20 ENCOUNTER — Telehealth: Payer: Self-pay | Admitting: Family

## 2020-11-20 NOTE — Telephone Encounter (Signed)
Patient would like to re-establish care

## 2020-11-22 ENCOUNTER — Ambulatory Visit: Payer: No Typology Code available for payment source | Admitting: Physician Assistant

## 2020-11-22 ENCOUNTER — Other Ambulatory Visit: Payer: Self-pay

## 2020-11-22 VITALS — BP 110/70 | HR 78 | Ht 66.5 in | Wt 171.0 lb

## 2020-11-22 DIAGNOSIS — Z01818 Encounter for other preprocedural examination: Secondary | ICD-10-CM | POA: Diagnosis not present

## 2020-11-22 DIAGNOSIS — I712 Thoracic aortic aneurysm, without rupture, unspecified: Secondary | ICD-10-CM

## 2020-11-22 DIAGNOSIS — Q230 Congenital stenosis of aortic valve: Secondary | ICD-10-CM | POA: Diagnosis not present

## 2020-11-22 DIAGNOSIS — Q231 Congenital insufficiency of aortic valve: Secondary | ICD-10-CM | POA: Diagnosis not present

## 2020-11-22 NOTE — Patient Instructions (Signed)
Medication Instructions:  Your physician recommends that you continue on your current medications as directed. Please refer to the Current Medication list given to you today.  *If you need a refill on your cardiac medications before your next appointment, please call your pharmacy*  Lab Work: NONE ordered at this time of appointment   If you have labs (blood work) drawn today and your tests are completely normal, you will receive your results only by: [place] (if you have MyChart) OR A paper copy in the mail If you have any lab test that is abnormal or we need to change your treatment, we will call you to review the results.  Testing/Procedures: NONE ordered at this time of appointment    Follow-Up: At [facility], you and your health needs are our priority.  As part of our continuing mission to provide you with exceptional heart care, we have created designated Provider Care Teams.  These Care Teams include your primary Cardiologist (physician) and Advanced Practice Providers (APPs -  Physician Assistants and Nurse Practitioners) who all work together to provide you with the care you need, when you need it.  Your next appointment:   As scheduled    The format for your next appointment:   In Person  Provider:   Quay Burow, MD  Other Instructions

## 2020-11-22 NOTE — Progress Notes (Signed)
Cardiology Office Note:    Date:  11/23/2020   ID:  EDITHA BRIDGEFORTH, DOB 1976/04/12, MRN [id number]  PCP:  Debbrah Alar, NP   [facility] Providers Cardiologist:  Quay Burow, MD {  Referring MD: Debbrah Alar, NP   Chief Complaint  Patient presents with   Follow-up   Pre-op Exam    Upcoming colonoscopy    History of Present Illness:    Charlene Carey is a 45 y.o. female with a hx of heart murmur since childhood, gestational diabetes, ascending aortic aneurysm and history of chickenpox.  Echocardiogram obtained in May 2018 showed normal EF, progression of her aortic stenosis in the moderate range.  Due to increasing dyspnea on exertion, she eventually underwent a left and right heart cath on 11/18/2016 that revealed normal coronary arteries, only mild aortic stenosis, aortic valve area calculated at 1.4 cm with peak gradient 20 mmHg.  Due to discrepancy between her TTE and cath result, she was referred for TEE which was performed on 01/27/2017 that confirmed moderate aortic stenosis with bicuspid aortic valve, peak aortic gradient 36 mmHg.Marland Kitchen  She was seen by Dr. Roxy Manns for surgical evaluation who felt she did not require aortic valve replacement at the time.  She was last seen by Dr. Gwenlyn Found in May 2021 at which time she was doing well.  Last CTA of the chest aorta obtained on 10/27/2019 showed stable 4.1 cm ascending thoracic aorta, annual imaging was recommended.  Repeat echocardiogram obtained recently on 11/15/2020 that showed EF 60 to 65%, moderate to severe aortic valve stenosis with mean gradient 31.1 mmHg, borderline dilatation of the ascending aorta measuring at 39 mm.  More recently, patient is planning to undergo colonoscopy on 11/27/2020, she presents today for preoperative clearance.  She denies any significant chest discomfort or worsening dyspnea.  She appears to be euvolemic on physical exam.  I printed out her last 2 echocardiogram and reviewed both echo with  her.  There has been no significant change in the degree of aortic stenosis.  She denies any dizziness, blurred vision or feeling of passing out.  She is cleared to proceed with colonoscopy as a low risk candidate.  Otherwise she is due for CTA next month prior to follow-up with Dr. Gwenlyn Found.    Past Medical History:  Diagnosis Date   Allergy-induced asthma    Aortic stenosis with bicuspid valve    Ascending aortic aneurysm (HCC)    Frequent headaches    Heartburn    History of chicken pox    History of gestational diabetes    Multinodular goiter    Seasonal allergies     Past Surgical History:  Procedure Laterality Date   BREAST REDUCTION SURGERY     OTHER SURGICAL HISTORY     had d& c   RIGHT/LEFT HEART CATH AND CORONARY ANGIOGRAPHY N/A 11/18/2016   Procedure: Right/Left Heart Cath and Coronary Angiography;  Surgeon: Lorretta Harp, MD;  Location: [facility];  Service: Cardiovascular;  Laterality: N/A;   TEE WITHOUT CARDIOVERSION N/A 01/27/2017   Procedure: TRANSESOPHAGEAL ECHOCARDIOGRAM (TEE);  Surgeon: Sanda Klein, MD;  Location: [facility];  Service: Cardiovascular;  Laterality: N/A;    Current Medications: No outpatient medications have been marked as taking for the 11/22/20 encounter (Office Visit) with Almyra Deforest, [place].     Allergies:   Codeine   Social History   Socioeconomic History   Marital status: Married    Spouse name: Not on file   Number of  children: Not on file   Years of education: Not on file   Highest education level: Not on file  Occupational History   Not on file  Tobacco Use   Smoking status: Former    Pack years: 0.00   Smokeless tobacco: Never  Vaping Use   Vaping Use: Never used  Substance and Sexual Activity   Alcohol use: Yes    Alcohol/week: 1.0 standard drink    Types: 1 Standard drinks or equivalent per week    Comment: depends on day    Drug use: No   Sexual activity: Not on file  Other Topics Concern   Not on file   Social History Narrative   Homeschools    [name]- born in 1997   [name]-2001   [name]- 2011 (from second marriage)   She is a stay at home mom   No pets   Completed 2 years of college    married (second marriage)   Enjoys Control and instrumentation engineer, gardening, herb garden   Social Determinants of Radio broadcast assistant Strain: Not on Art therapist Insecurity: Not on file  Transportation Needs: Not on file  Physical Activity: Not on file  Stress: Not on file  Social Connections: Not on file     Family History: The patient's family history includes Diabetes in her father; Hyperlipidemia in her maternal grandfather; Hypertension in her maternal grandfather and mother; Stroke in her maternal grandfather; Thyroid disease in her mother.  ROS:   Please see the history of present illness.     All other systems reviewed and are negative.  EKGs/Labs/Other Studies Reviewed:    The following studies were reviewed today:  Echo 11/15/2020  1. Left ventricular ejection fraction, by estimation, is 60 to 65%. The  left ventricle has normal function. The left ventricle has no regional  wall motion abnormalities. Left ventricular diastolic parameters were  normal.   2. Right ventricular systolic function is normal. The right ventricular  size is normal. There is normal pulmonary artery systolic pressure.   3. The mitral valve is normal in structure. No evidence of mitral valve  regurgitation. No evidence of mitral stenosis.   4. The aortic valve is bicuspid. Aortic valve regurgitation is not  visualized. Moderate to severe aortic valve stenosis. Aortic valve mean  gradient measures 31.1 mmHg. Aortic valve Vmax measures 3.65 m/s. Aortic  valve acceleration time measures 81 msec.   5. No coarctation is seen. There is borderline dilatation of the  ascending aorta, measuring 39 mm.   6. The inferior vena cava is normal in size with greater than 50%  respiratory variability, suggesting right atrial pressure of 3  mmHg.   Comparison(s): A prior study was performed on 10/27/2019. No significant  change from prior study. Prior images reviewed side by side.   EKG:  EKG is ordered today.  The ekg ordered today demonstrates normal sinus rhythm without significant ST-T wave changes  Recent Labs: No results found for requested labs within last 8760 hours.  Recent Lipid Panel    Component Value Date/Time   CHOL 172 01/14/2017 1040   TRIG 65.0 01/14/2017 1040   HDL 52.00 01/14/2017 1040   CHOLHDL 3 01/14/2017 1040   VLDL 13.0 01/14/2017 1040   LDLCALC 107 (H) 01/14/2017 1040     Risk Assessment/Calculations:       Physical Exam:    VS:  BP 110/70 (BP Location: Left Arm, Patient Position: Sitting, Cuff Size: Normal)   Pulse 78  Ht 5' 6.5" (1.689 m)   Wt 171 lb (77.6 kg)   BMI 27.19 kg/m     Wt Readings from Last 3 Encounters:  11/22/20 171 lb (77.6 kg)  11/02/19 170 lb 6.4 oz (77.3 kg)  10/05/19 170 lb (77.1 kg)     GEN:  Well nourished, well developed in no acute distress HEENT: Normal NECK: No JVD; No carotid bruits LYMPHATICS: No lymphadenopathy CARDIAC: RRR, no murmurs, rubs, gallops RESPIRATORY:  Clear to auscultation without rales, wheezing or rhonchi  ABDOMEN: Soft, non-tender, non-distended MUSCULOSKELETAL:  No edema; No deformity  SKIN: Warm and dry NEUROLOGIC:  Alert and oriented x 3 PSYCHIATRIC:  Normal affect   ASSESSMENT:    1. Pre-op evaluation   2. Aortic stenosis due to bicuspid aortic valve   3. Thoracic aortic aneurysm without rupture (HCC)    PLAN:    In order of problems listed above:  Preoperative clearance: She is a low risk patient for the upcoming colonoscopy.  Recent echocardiogram shows no significant progression of aortic stenosis.  Aortic stenosis: Recent echocardiogram shows stable transaortic gradient, moderate aortic stenosis.  Thoracic aortic aneurysm: Due for upcoming CTA of the chest.        Medication Adjustments/Labs and Tests  Ordered: Current medicines are reviewed at length with the patient today.  Concerns regarding medicines are outlined above.  Orders Placed This Encounter  Procedures   EKG 12-Lead   No orders of the defined types were placed in this encounter.   Patient Instructions  Medication Instructions:  Your physician recommends that you continue on your current medications as directed. Please refer to the Current Medication list given to you today.  *If you need a refill on your cardiac medications before your next appointment, please call your pharmacy*  Lab Work: NONE ordered at this time of appointment   If you have labs (blood work) drawn today and your tests are completely normal, you will receive your results only by: [place] (if you have MyChart) OR A paper copy in the mail If you have any lab test that is abnormal or we need to change your treatment, we will call you to review the results.  Testing/Procedures: NONE ordered at this time of appointment    Follow-Up: At [facility], you and your health needs are our priority.  As part of our continuing mission to provide you with exceptional heart care, we have created designated Provider Care Teams.  These Care Teams include your primary Cardiologist (physician) and Advanced Practice Providers (APPs -  Physician Assistants and Nurse Practitioners) who all work together to provide you with the care you need, when you need it.  Your next appointment:   As scheduled    The format for your next appointment:   In Person  Provider:   Quay Burow, MD  Other Instructions    Signed, Almyra Deforest, [place]  11/23/2020 10:11 PM    [place]

## 2020-11-22 NOTE — Telephone Encounter (Signed)
Lvm to schedule appt.

## 2020-11-23 ENCOUNTER — Encounter: Payer: Self-pay | Admitting: Physician Assistant

## 2020-12-19 ENCOUNTER — Ambulatory Visit (INDEPENDENT_AMBULATORY_CARE_PROVIDER_SITE_OTHER): Payer: No Typology Code available for payment source | Admitting: Family

## 2020-12-19 ENCOUNTER — Telehealth: Payer: Self-pay | Admitting: Family

## 2020-12-19 ENCOUNTER — Encounter: Payer: Self-pay | Admitting: Family

## 2020-12-19 ENCOUNTER — Other Ambulatory Visit: Payer: Self-pay

## 2020-12-19 VITALS — BP 122/74 | HR 85 | Temp 97.1°F | Resp 17 | Ht 66.5 in | Wt 167.0 lb

## 2020-12-19 DIAGNOSIS — E042 Nontoxic multinodular goiter: Secondary | ICD-10-CM | POA: Diagnosis not present

## 2020-12-19 DIAGNOSIS — N926 Irregular menstruation, unspecified: Secondary | ICD-10-CM

## 2020-12-19 DIAGNOSIS — F419 Anxiety disorder, unspecified: Secondary | ICD-10-CM

## 2020-12-19 DIAGNOSIS — E559 Vitamin D deficiency, unspecified: Secondary | ICD-10-CM | POA: Diagnosis not present

## 2020-12-19 DIAGNOSIS — G5602 Carpal tunnel syndrome, left upper limb: Secondary | ICD-10-CM

## 2020-12-19 DIAGNOSIS — R202 Paresthesia of skin: Secondary | ICD-10-CM

## 2020-12-19 LAB — COMPREHENSIVE METABOLIC PANEL
ALT: 15 U/L (ref 0–35)
AST: 16 U/L (ref 0–37)
Albumin: 4.7 g/dL (ref 3.5–5.2)
Alkaline Phosphatase: 47 U/L (ref 39–117)
BUN: 14 mg/dL (ref 6–23)
CO2: 28 mEq/L (ref 19–32)
Calcium: 10.1 mg/dL (ref 8.4–10.5)
Chloride: 103 mEq/L (ref 96–112)
Creatinine, Ser: 0.91 mg/dL (ref 0.40–1.20)
GFR: 75.18 mL/min (ref >60.00)
Glucose, Bld: 110 mg/dL — ABNORMAL HIGH (ref 70–99)
Potassium: 5.1 mEq/L (ref 3.5–5.1)
Sodium: 138 mEq/L (ref 135–145)
Total Bilirubin: 0.4 mg/dL (ref 0.2–1.2)
Total Protein: 7.1 g/dL (ref 6.0–8.3)

## 2020-12-19 LAB — CBC WITH DIFFERENTIAL/PLATELET
Basophils Absolute: 0.1 10*3/uL (ref 0.0–0.1)
Basophils Relative: 1.5 % (ref 0.0–3.0)
Eosinophils Absolute: 0 10*3/uL (ref 0.0–0.7)
Eosinophils Relative: 0.9 % (ref 0.0–5.0)
HCT: 42.7 % (ref 36.0–46.0)
Hemoglobin: 14.5 g/dL (ref 12.0–15.0)
Lymphocytes Relative: 37 % (ref 12.0–46.0)
Lymphs Abs: 1.9 10*3/uL (ref 0.7–4.0)
MCHC: 34 g/dL (ref 30.0–36.0)
MCV: 89.8 fl (ref 78.0–100.0)
Monocytes Absolute: 0.4 10*3/uL (ref 0.1–1.0)
Monocytes Relative: 8.3 % (ref 3.0–12.0)
Neutro Abs: 2.7 10*3/uL (ref 1.4–7.7)
Neutrophils Relative %: 52.3 % (ref 43.0–77.0)
Platelets: 201 10*3/uL (ref 150.0–400.0)
RBC: 4.75 Mil/uL (ref 3.87–5.11)
RDW: 12.5 % (ref 11.5–15.5)
WBC: 5.2 10*3/uL (ref 4.0–10.5)

## 2020-12-19 LAB — TSH: TSH: 0.28 u[IU]/mL — ABNORMAL LOW (ref 0.35–5.50)

## 2020-12-19 LAB — VITAMIN D 25 HYDROXY (VIT D DEFICIENCY, FRACTURES): VITD: 37.72 ng/mL (ref 30.00–100.00)

## 2020-12-19 LAB — B12 AND FOLATE PANEL
Folate: 7.6 ng/mL (ref >5.9)
Vitamin B-12: 354 pg/mL (ref 211–911)

## 2020-12-19 MED ORDER — MELOXICAM 7.5 MG PO TABS: 7.5000 mg | ORAL_TABLET | Freq: Every day | ORAL | 0 refills | Status: DC

## 2020-12-19 NOTE — Patient Instructions (Signed)
Please purchase a wrist splint and wear in the evenings and as able and while you sleep. Begin meloxicam once daily for the carpal tunnel.  You should be contacted about scheduling your appointment with neurology and GYN.

## 2020-12-19 NOTE — Telephone Encounter (Signed)
Please call Dr. Lorie Apley office and request copy of colonoscopy.  Please call [facility] and request copy of Pap smear.
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
## 2020-12-20 ENCOUNTER — Other Ambulatory Visit (INDEPENDENT_AMBULATORY_CARE_PROVIDER_SITE_OTHER): Payer: No Typology Code available for payment source

## 2020-12-20 DIAGNOSIS — E0590 Thyrotoxicosis, unspecified without thyrotoxic crisis or storm: Secondary | ICD-10-CM | POA: Diagnosis not present

## 2020-12-20 DIAGNOSIS — N926 Irregular menstruation, unspecified: Secondary | ICD-10-CM | POA: Insufficient documentation

## 2020-12-20 DIAGNOSIS — F419 Anxiety disorder, unspecified: Secondary | ICD-10-CM | POA: Insufficient documentation

## 2020-12-20 DIAGNOSIS — G5602 Carpal tunnel syndrome, left upper limb: Secondary | ICD-10-CM | POA: Insufficient documentation

## 2020-12-20 DIAGNOSIS — R739 Hyperglycemia, unspecified: Secondary | ICD-10-CM | POA: Diagnosis not present

## 2020-12-20 DIAGNOSIS — R202 Paresthesia of skin: Secondary | ICD-10-CM | POA: Insufficient documentation

## 2020-12-20 DIAGNOSIS — E559 Vitamin D deficiency, unspecified: Secondary | ICD-10-CM | POA: Insufficient documentation

## 2020-12-20 LAB — HEMOGLOBIN A1C: Hgb A1c MFr Bld: 5.7 % (ref 4.6–6.5)

## 2020-12-20 LAB — T3, FREE: T3, Free: 3.5 pg/mL (ref 2.3–4.2)

## 2020-12-20 LAB — T4, FREE: Free T4: 1.16 ng/dL (ref 0.60–1.60)

## 2020-12-20 NOTE — Assessment & Plan Note (Signed)
New. Recommended OTC  Wrist splint and trial of meloxicam 7.5mg .

## 2020-12-20 NOTE — Assessment & Plan Note (Signed)
Uncontrolled. Declines medication. Discussed counseling.

## 2020-12-20 NOTE — Assessment & Plan Note (Signed)
New, refer to GYN.

## 2020-12-20 NOTE — Assessment & Plan Note (Signed)
Not currently on vit D supplement. Check vit D level.

## 2020-12-21 ENCOUNTER — Encounter: Payer: Self-pay | Admitting: Family

## 2020-12-21 DIAGNOSIS — R739 Hyperglycemia, unspecified: Secondary | ICD-10-CM | POA: Insufficient documentation

## 2020-12-21 DIAGNOSIS — E0590 Thyrotoxicosis, unspecified without thyrotoxic crisis or storm: Secondary | ICD-10-CM | POA: Insufficient documentation

## 2020-12-22 NOTE — Telephone Encounter (Signed)
Rec requests have been faxed.

## 2020-12-25 ENCOUNTER — Telehealth: Payer: Self-pay | Admitting: Family

## 2020-12-25 DIAGNOSIS — R202 Paresthesia of skin: Secondary | ICD-10-CM

## 2020-12-25 NOTE — Telephone Encounter (Signed)
The patient states that [facility] next appointment is not till November. The patient feels that she needs to be seen before that. The patient is wondering if you could refer her to [facility].

## 2020-12-26 NOTE — Addendum Note (Signed)
Addended by: Debbrah Alar on: 12/26/2020 10:29 AM   Modules accepted: Orders

## 2020-12-27 ENCOUNTER — Other Ambulatory Visit: Payer: Self-pay

## 2020-12-27 ENCOUNTER — Ambulatory Visit (INDEPENDENT_AMBULATORY_CARE_PROVIDER_SITE_OTHER)
Admission: RE | Admit: 2020-12-27 | Discharge: 2020-12-27 | Disposition: A | Discharge status: Home / Self Care | Payer: No Typology Code available for payment source | Source: Ambulatory Visit | Attending: Cardiovascular Disease | Admitting: Cardiovascular Disease

## 2020-12-27 DIAGNOSIS — I712 Thoracic aortic aneurysm, without rupture, unspecified: Secondary | ICD-10-CM

## 2020-12-27 IMAGING — CT CT ANGIO CHEST
3 of 8 series · 18 of 46 positions shown · IV contrast (OMNIPAQUE 350)
Comparison: 100 cc omni 350

CLINICAL DATA: Thoracic aortic aneurysm

EXAM:
CT ANGIOGRAPHY CHEST WITH CONTRAST
TECHNIQUE: Multidetector CT imaging of the chest was performed using the
standard protocol during bolus administration of intravenous
contrast. Multiplanar CT image reconstructions and MIPs were
obtained to evaluate the vascular anatomy.
CONTRAST:  100mL OMNIPAQUE IOHEXOL 350 MG/ML SOLN

[Series 4: aorta 3.0 bf37 2 · axial · 0.70mm/px · z∈[-320,-62]mm · 13 of 102 slices shown]
[im 8/102  lung]
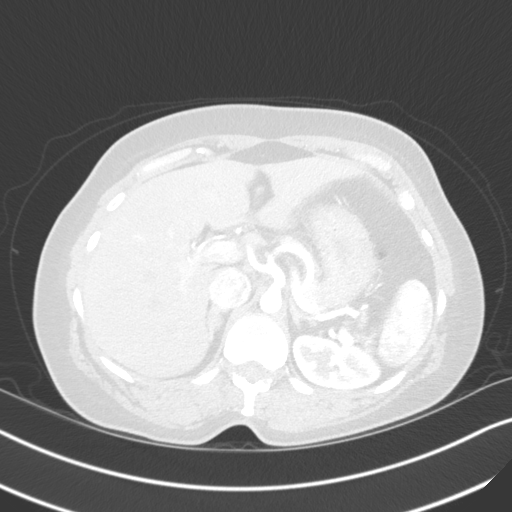
[im 15/102  soft-tissue]
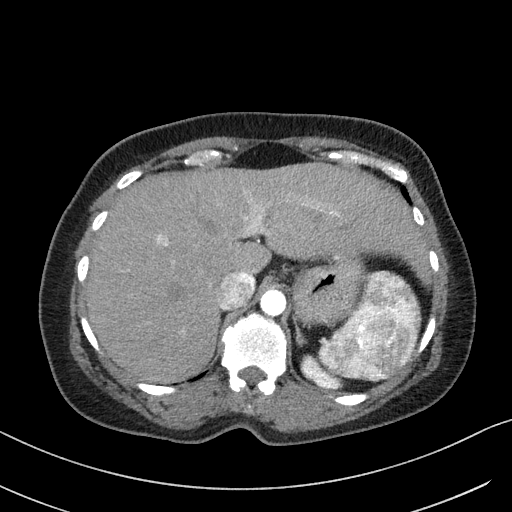
[im 22/102  lung]
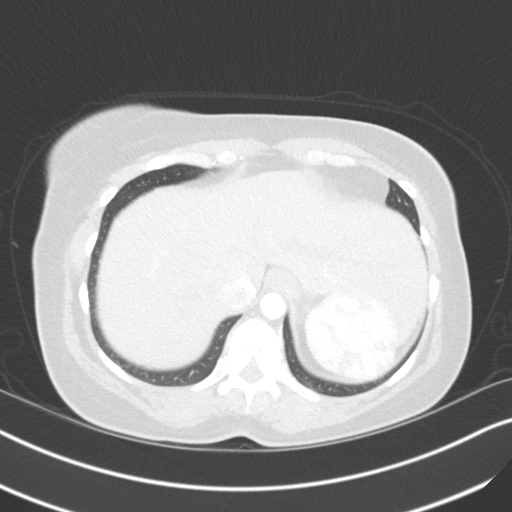
[im 29/102  soft-tissue]
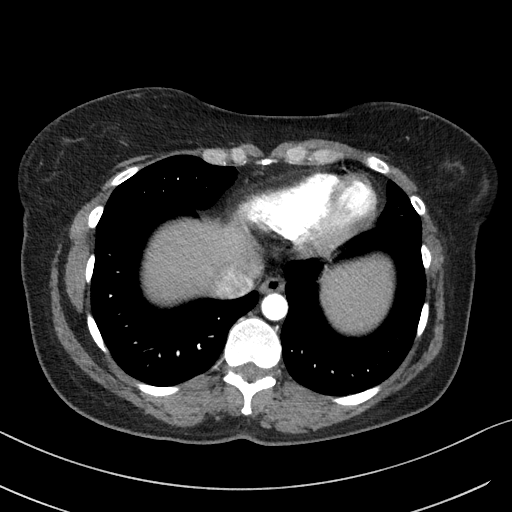
[im 37/102  lung]
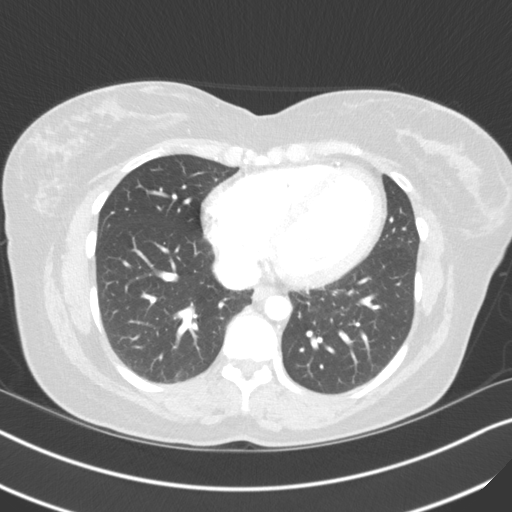
[im 44/102  soft-tissue]
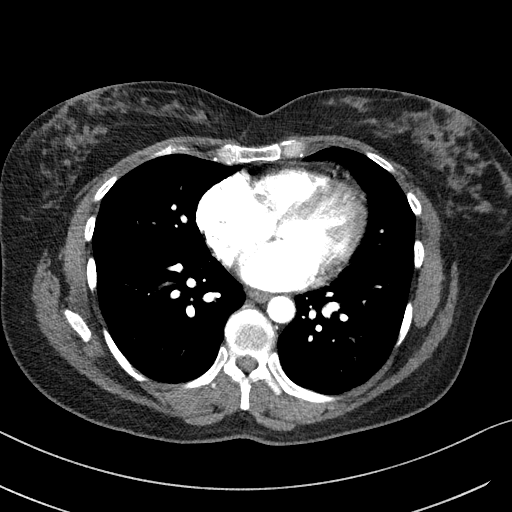
[im 51/102  lung]
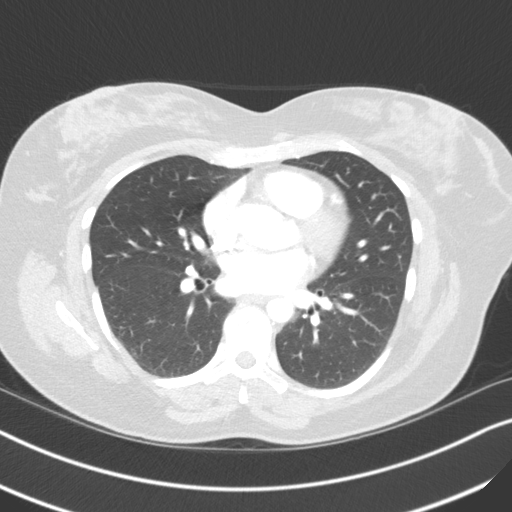
[im 58/102  soft-tissue]
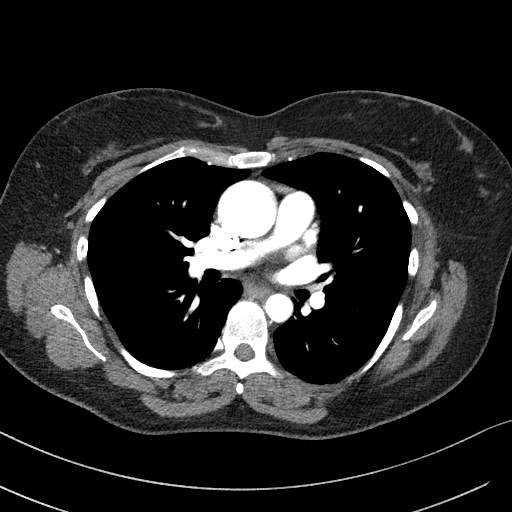
[im 65/102  lung]
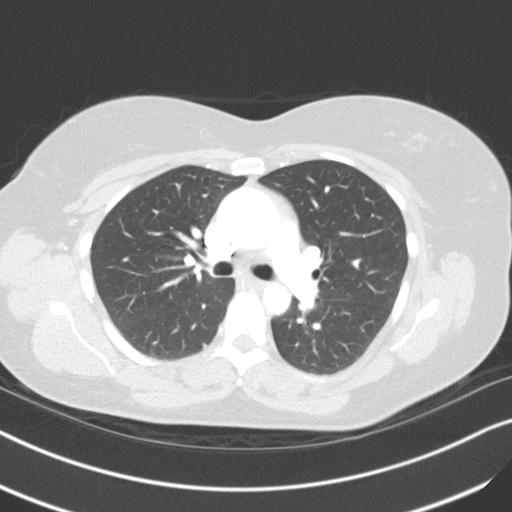
[im 73/102  soft-tissue]
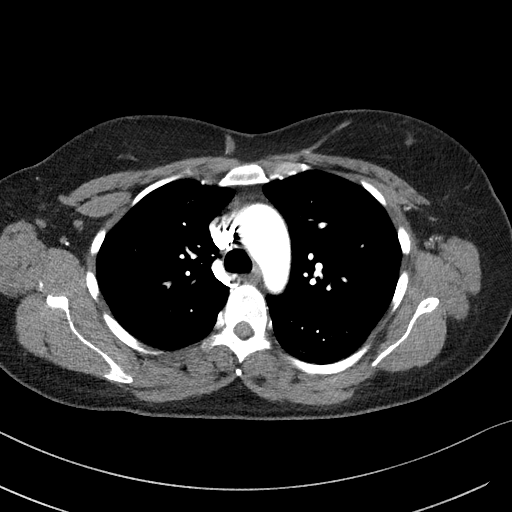
[im 80/102  lung]
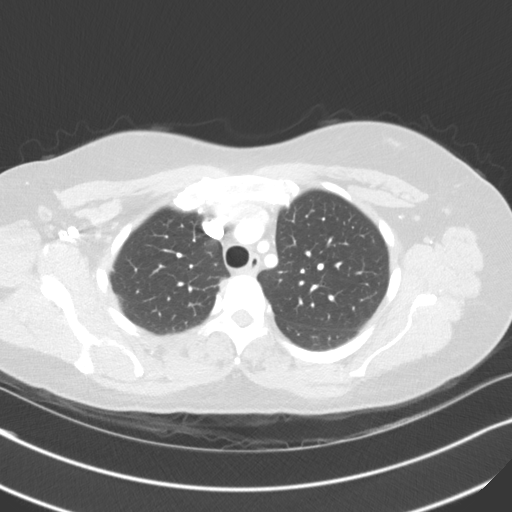
[im 87/102  soft-tissue]
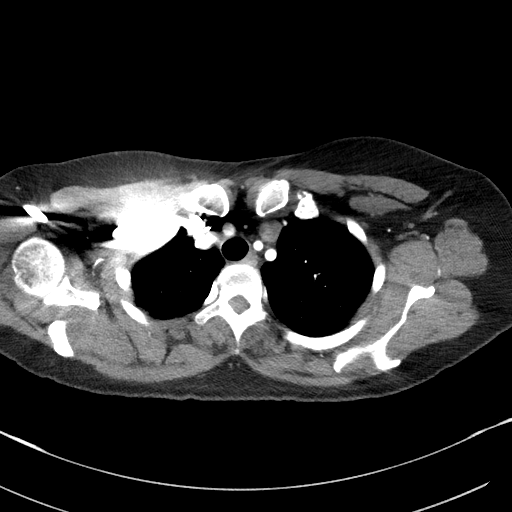
[im 94/102  lung]
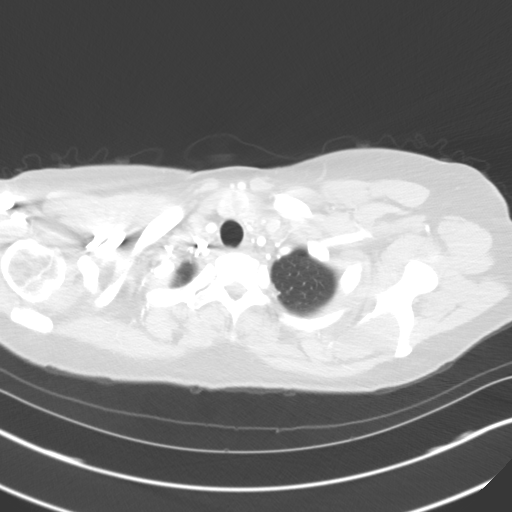

[Series 5: lung · axial · 0.70mm/px · z∈[-320,-278]mm · 2 of 102 slices shown]
[im 8/102  soft-tissue]
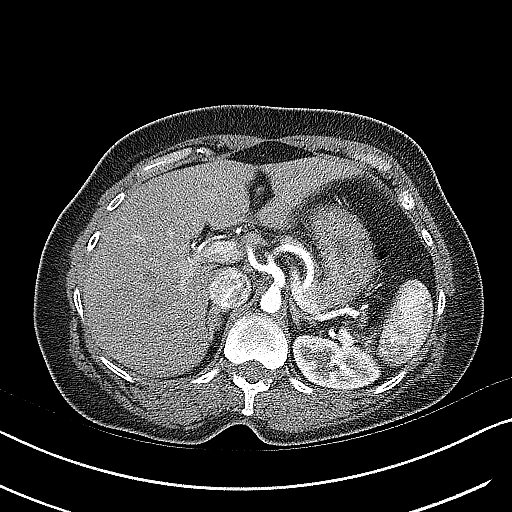
[im 22/102  soft-tissue]
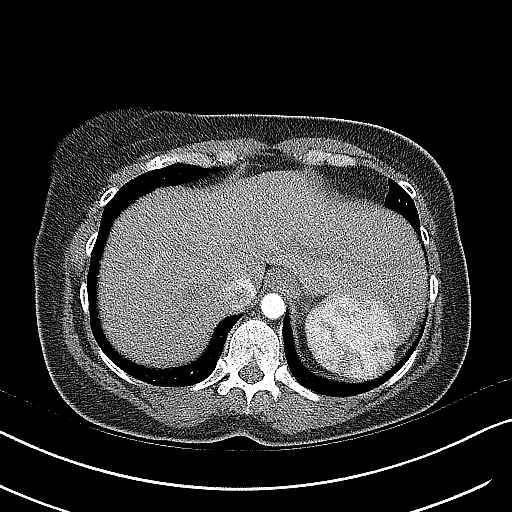

[Series 7: coronals · coronal · 0.59mm/px · 3 of 108 slices shown]
[im 27/108  soft-tissue]
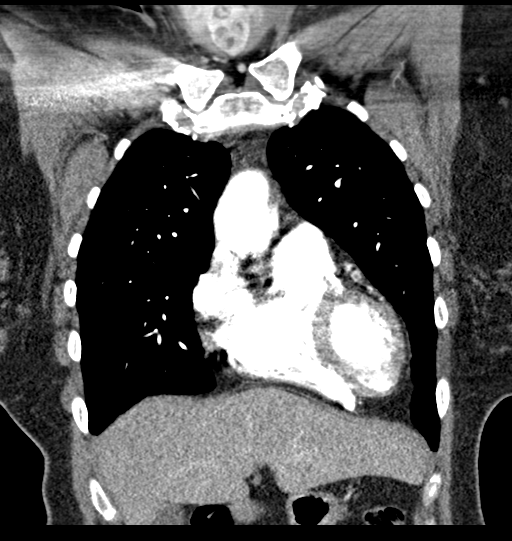
[im 54/108  soft-tissue]
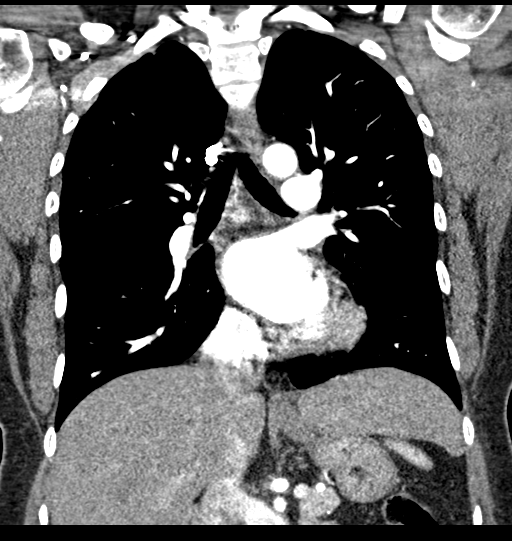
[im 81/108  soft-tissue]
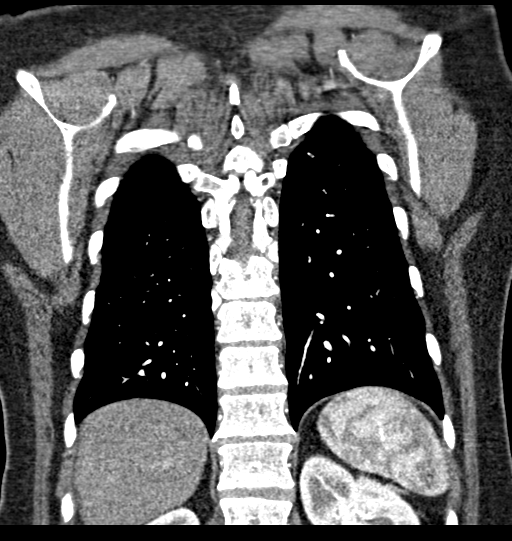

[18 of 46 positions shown; findings below may reference images not displayed]

FINDINGS: Cardiovascular: Stable fusiform aneurysmal dilatation of the
ascending thoracic aorta extending into the proximal aortic arch.

Ascending thoracic aorta maximal diameter remains 4.1 cm. Proximal
aortic arch dilatation remains 3.8 cm.

Patent 3 vessel arch anatomy. Descending thoracic aorta remains
normal in caliber.

No mediastinal hemorrhage or hematoma. No interval acute dissection.

Central pulmonary arteries are patent and normal in caliber.
Negative for acute pulmonary embolus. Normal heart size. No
pericardial effusion. Central venous structures are patent. No
ORVIN process. Pulmonary veins are patent. No anomalous
pulmonary venous return appreciated.

Mediastinum/Nodes: Stable enlarged heterogeneous multinodular
thyroid which has been previously evaluated by ultrasound.

Trachea and central airways are patent. Esophagus nondilated. No
hiatal hernia. No bulky adenopathy.

Lungs/Pleura: Lungs are clear. No pleural effusion or pneumothorax.

Upper Abdomen: No acute abnormality.

Musculoskeletal: No chest wall abnormality. No acute or significant
osseous findings.

Review of the MIP images confirms the above findings.
IMPRESSION: Stable 4.1 cm ascending thoracic aortic aneurysm extending to
involve the proximal arch as above.

Recommend annual imaging followup by CTA or MRA. This recommendation
follows [F8] ACCF/AHA/AATS/ACR/ASA/SCA/ORVIN/ORVIN/ORVIN/ORVIN Guidelines
for the Diagnosis and Management of Patients with Thoracic Aortic
Disease. Circulation. [F8]; 121: [id number]. Aortic aneurysm NOS
([F8]-[F8]) no other acute intrathoracic finding.

## 2020-12-27 MED ORDER — IOHEXOL 350 MG/ML SOLN
100.0000 mL | Freq: Once | INTRAVENOUS | Status: AC | PRN
  Administered 2020-12-27: 100 mL via INTRAVENOUS

## 2021-01-16 ENCOUNTER — Encounter: Payer: Self-pay | Admitting: *Deleted

## 2021-01-17 ENCOUNTER — Ambulatory Visit: Payer: No Typology Code available for payment source | Admitting: Cardiovascular Disease

## 2021-01-17 ENCOUNTER — Encounter: Payer: No Typology Code available for payment source | Admitting: Family

## 2021-01-24 ENCOUNTER — Encounter: Payer: No Typology Code available for payment source | Admitting: Family

## 2021-01-24 NOTE — Progress Notes (Incomplete)
Subjective:   By signing my name below, I, [name], attest that this documentation has been prepared under the direction and in the presence of [name]. 01/24/2021    Patient ID: Charlene Carey, female    DOB: [birth year], 45 y.o.   MRN: [id number]  No chief complaint on file.   HPI Patient is in today for a comprehensive physical exam.   She denies having any unexpected weight change, ear pain, hearing loss and rhinorrhea, visual disturbance, cough, chest pain and leg swelling, nausea, vomiting, diarrhea and blood in stool, or dysuria and frequency, for myalgias and arthralgias, rash, headaches, adenopathy, depression or anxiety at this time.   Immunizations: Diet: Exercise: Colonoscopy: Last completed 11/27/2020. Results showed one small sessile polyp in distal sigmoid colon which was removed, otherwise results are normal. Repeat in 10 years.   Pap Smear: Last completed 09/25/2020. Results are normal. Repeat in 3 years.  Dental: Vision:  Health Maintenance Due  Topic Date Due   Pneumococcal Vaccine 73-72 Years old (1 - PCV) Never done   HIV Screening  Never done   Hepatitis C Screening  Never done   TETANUS/TDAP  06/11/2019   COVID-19 Vaccine (2 - Moderna risk series) 02/10/2020   INFLUENZA VACCINE  01/08/2021    Past Medical History:  Diagnosis Date   Allergy-induced asthma    Aortic stenosis with bicuspid valve    Ascending aortic aneurysm (HCC)    Frequent headaches    Heartburn    History of chicken pox    History of gestational diabetes    Hyperglycemia    Multinodular goiter    Seasonal allergies    Subclinical hyperthyroidism     Past Surgical History:  Procedure Laterality Date   BREAST REDUCTION SURGERY     OTHER SURGICAL HISTORY     had d& c   RIGHT/LEFT HEART CATH AND CORONARY ANGIOGRAPHY N/A 11/18/2016   Procedure: Right/Left Heart Cath and Coronary Angiography;  Surgeon: Lorretta Harp, MD;  Location: [facility];   Service: Cardiovascular;  Laterality: N/A;   TEE WITHOUT CARDIOVERSION N/A 01/27/2017   Procedure: TRANSESOPHAGEAL ECHOCARDIOGRAM (TEE);  Surgeon: Sanda Klein, MD;  [facility];  Service: Cardiovascular;  Laterality: N/A;    Family History  Problem Relation Age of Onset   Hypertension Mother    Thyroid disease Mother        Hypothyroid   Hyperlipidemia Mother    Diabetes Father    CAD Father    Alcohol abuse Brother    Hypertension Maternal Grandfather    Stroke Maternal Grandfather    Hyperlipidemia Maternal Grandfather    Bipolar disorder Daughter     Social History   Socioeconomic History   Marital status: Married    Spouse name: Not on file   Number of children: Not on file   Years of education: Not on file   Highest education level: Not on file  Occupational History   Not on file  Tobacco Use   Smoking status: Former   Smokeless tobacco: Never  Vaping Use   Vaping Use: Never used  Substance and Sexual Activity   Alcohol use: Yes    Alcohol/week: 1.0 standard drink    Types: 1 Standard drinks or equivalent per week    Comment: depends on day    Drug use: No   Sexual activity: Not on file  Other Topics Concern   Not on file  Social History Narrative   Homeschools  [name]- born in 1997   [name]-1999   [name]- 2011 (from second marriage)- homeschools   She is a stay at home mom   No pets   Completed 2 years of college    married (second marriage)   Enjoys Control and instrumentation engineer, gardening, herb garden   Daughter and grandson live with her   Social Determinants of Radio broadcast assistant Strain: Not on file  Food Insecurity: Not on file  Transportation Needs: Not on file  Physical Activity: Not on file  Stress: Not on file  Social Connections: Not on file  Intimate Partner Violence: Not on file    Outpatient Medications Prior to Visit  Medication Sig Dispense Refill   meloxicam (MOBIC) 7.5 MG tablet Take 1 tablet (7.5 mg total) by mouth daily. 14  tablet 0   No facility-administered medications prior to visit.    Allergies  Allergen Reactions   Codeine Nausea Only    Has withdrawal when trying to stop med    Review of Systems  Constitutional:        (-)unexpected weight change (-)Adenopathy  HENT:  Negative for hearing loss.        (-)Rhinorrhea   Eyes:        (-)Visual disturbance  Respiratory:  Negative for cough.   Cardiovascular:  Negative for chest pain and leg swelling.  Gastrointestinal:  Negative for blood in stool, constipation, diarrhea, nausea and vomiting.  Genitourinary:  Negative for dysuria and frequency.  Musculoskeletal:  Negative for joint pain and myalgias.  Skin:  Negative for rash.  Neurological:  Negative for headaches.  Psychiatric/Behavioral:  Negative for depression. The patient is not nervous/anxious.       Objective:    Physical Exam Constitutional:      General: She is not in acute distress.    Appearance: Normal appearance. She is not ill-appearing.  HENT:     Head: Normocephalic and atraumatic.     Right Ear: Tympanic membrane, ear canal and external ear normal.     Left Ear: Tympanic membrane, ear canal and external ear normal.  Eyes:     Extraocular Movements: Extraocular movements intact.     Pupils: Pupils are equal, round, and reactive to light.  Cardiovascular:     Rate and Rhythm: Regular rhythm.     Pulses: Normal pulses.     Heart sounds: Normal heart sounds. No murmur heard.   No gallop.  Pulmonary:     Effort: Pulmonary effort is normal. No respiratory distress.     Breath sounds: Normal breath sounds. No wheezing or rales.  Abdominal:     General: Bowel sounds are normal. There is no distension.     Palpations: Abdomen is soft. There is no mass.     Tenderness: There is no abdominal tenderness. There is no guarding or rebound.  Skin:    General: Skin is warm and dry.  Neurological:     Mental Status: She is alert and oriented to person, place, and time.      Deep Tendon Reflexes:     Reflex Scores:      Patellar reflexes are 2+ on the right side and 2+ on the left side. Psychiatric:        Behavior: Behavior normal.    There were no vitals taken for this visit. Wt Readings from Last 3 Encounters:  12/19/20 167 lb (75.8 kg)  11/22/20 171 lb (77.6 kg)  11/02/19 170 lb 6.4 oz (77.3 kg)  Assessment & Plan:   Problem List Items Addressed This Visit   None    No orders of the defined types were placed in this encounter.   I, [name], personally preformed the services described in this documentation.  All medical record entries made by the scribe were at my direction and in my presence.  I have reviewed the chart and discharge instructions (if applicable) and agree that the record reflects my personal performance and is accurate and complete. 01/24/2021   I,[name],acting as a Education administrator for Nance Pear, NP.,have documented all relevant documentation on the behalf of Nance Pear, NP,as directed by  Nance Pear, NP while in the presence of Nance Pear, NP.   Shehryar [employer]

## 2021-01-31 ENCOUNTER — Other Ambulatory Visit: Payer: Self-pay

## 2021-01-31 ENCOUNTER — Ambulatory Visit (INDEPENDENT_AMBULATORY_CARE_PROVIDER_SITE_OTHER): Payer: No Typology Code available for payment source | Admitting: Family

## 2021-01-31 ENCOUNTER — Encounter: Payer: Self-pay | Admitting: Family

## 2021-01-31 VITALS — BP 118/84 | HR 74 | Temp 98.7°F | Resp 16 | Ht 66.5 in | Wt 170.0 lb

## 2021-01-31 DIAGNOSIS — E0590 Thyrotoxicosis, unspecified without thyrotoxic crisis or storm: Secondary | ICD-10-CM

## 2021-01-31 DIAGNOSIS — Z0000 Encounter for general adult medical examination without abnormal findings: Secondary | ICD-10-CM

## 2021-01-31 LAB — LIPID PANEL
Cholesterol: 183 mg/dL (ref 0–200)
HDL: 46.3 mg/dL (ref >39.00)
LDL Cholesterol: 114 mg/dL — ABNORMAL HIGH (ref 0–99)
NonHDL: 136.95
Total CHOL/HDL Ratio: 4
Triglycerides: 116 mg/dL (ref 0.0–149.0)
VLDL: 23.2 mg/dL (ref 0.0–40.0)

## 2021-01-31 NOTE — Progress Notes (Signed)
Subjective:   By signing my name below, I, Charlene Carey, attest that this documentation has been prepared under the direction and in the presence of Charlene Alar NP. 01/31/2021    Patient ID: Charlene Carey, female    DOB: 05-Jan-1976, 45 y.o.   MRN: [id number]  Chief Complaint  Patient presents with   Annual Exam    HPI Patient is in today for a comprehensive physical exam.  She denies having any unexpected weight change, ear pain, hearing loss and rhinorrhea, visual disturbance, cough, chest pain and leg swelling, nausea, vomiting, diarrhea and blood in stool, or dysuria and frequency, for myalgias and arthralgias, rash, headaches, adenopathy, depression or anxiety at this time. She reports no recent changes to her family medical history. She reports having no recent changes to her surgical history. She occasionally drinks a glass of wine. She does not use drugs. She does not use birth control. She does not have regular menstrual cycles and reports that she is two months late. She notes that her menstrual cycle is irregular as well. She does not get hot flashes at this time.   GYN- She is planning to schedule an appointment after this visit. Depression/Anxiety- She reports this week is stressful but otherwise she is managing her symptoms well.   Immunizations: She is due for a tetanus vaccine and is willing to get it during this visit. She has 1 moderna Covid-19 vaccine and is willing to get it at a later date.  Diet: She is not managing a healthy diet this week. She typically manages a healthy diet. She does not regularly eat junk food and consistently eats fruits and vegetables. Exercise: She participates in exercise by walking in her neighborhood daily.  Colonoscopy: Last completed 11/27/2020. Results showed small sessile polyp in distal sigmoid colon which was removed, otherwise results are normal. Repeat in 10 years.  Pap Smear: Last completed 09/25/2020. Results are  normal. Repeat in 3 years.  Mammogram: MRI last completed 10/31/2020. Results showed indeterminate 1.4 cm linear non mass enhancement over middle to posterior third of the upper central right breast. Left breast normal.  Dental: She is UTD on dental care.  Vision: She is due for vision care. She is planning to schedule an appointment after this visit.   Health Maintenance Due  Topic Date Due   Pneumococcal Vaccine 34-43 Years old (1 - PCV) Never done   HIV Screening  Never done   Hepatitis C Screening  Never done   TETANUS/TDAP  06/11/2019   COVID-19 Vaccine (2 - Moderna risk series) 02/10/2020   INFLUENZA VACCINE  01/08/2021    Past Medical History:  Diagnosis Date   Allergy-induced asthma    Aortic stenosis with bicuspid valve    Ascending aortic aneurysm (HCC)    Frequent headaches    Heartburn    History of chicken pox    History of gestational diabetes    Hyperglycemia    Multinodular goiter    Seasonal allergies    Subclinical hyperthyroidism     Past Surgical History:  Procedure Laterality Date   BREAST REDUCTION SURGERY     OTHER SURGICAL HISTORY     had d& c   RIGHT/LEFT HEART CATH AND CORONARY ANGIOGRAPHY N/A 11/18/2016   Procedure: Right/Left Heart Cath and Coronary Angiography;  Surgeon: Lorretta Harp, MD;  Location: [facility];  Service: Cardiovascular;  Laterality: N/A;   TEE WITHOUT CARDIOVERSION N/A 01/27/2017   Procedure: TRANSESOPHAGEAL ECHOCARDIOGRAM (TEE);  Surgeon:  Croitoru, Dani Gobble, MD;  Location: [facility];  Service: Cardiovascular;  Laterality: N/A;    Family History  Problem Relation Age of Onset   Hypertension Mother    Thyroid disease Mother        Hypothyroid   Hyperlipidemia Mother    Diabetes Father    CAD Father    Alcohol abuse Brother    Hypertension Maternal Grandfather    Stroke Maternal Grandfather    Hyperlipidemia Maternal Grandfather    Bipolar disorder Daughter     Social History   Socioeconomic History    Marital status: Married    Spouse name: Not on file   Number of children: Not on file   Years of education: Not on file   Highest education level: Not on file  Occupational History   Not on file  Tobacco Use   Smoking status: Former   Smokeless tobacco: Never  Vaping Use   Vaping Use: Never used  Substance and Sexual Activity   Alcohol use: Yes    Alcohol/week: 1.0 standard drink    Types: 1 Standard drinks or equivalent per week    Comment: occasional glass of wine   Drug use: No   Sexual activity: Yes    Partners: Male  Other Topics Concern   Not on file  Social History Narrative   Homeschools    [name]- born in 1997   [name]-1999   [name]- 2011 (from second marriage)- homeschools   She is a stay at home mom   No pets   Completed 2 years of college    married (second marriage)   Enjoys Control and instrumentation engineer, gardening, herb garden   Daughter and grandson live with her   Social Determinants of Radio broadcast assistant Strain: Not on file  Food Insecurity: Not on file  Transportation Needs: Not on file  Physical Activity: Not on file  Stress: Not on file  Social Connections: Not on file  Intimate Partner Violence: Not on file    Outpatient Medications Prior to Visit  Medication Sig Dispense Refill   meloxicam (MOBIC) 7.5 MG tablet Take 1 tablet (7.5 mg total) by mouth daily. 14 tablet 0   No facility-administered medications prior to visit.    Allergies  Allergen Reactions   Codeine Nausea Only    Has withdrawal when trying to stop med    Review of Systems  Constitutional:        (-)unexpected weight change (-)Adenopathy  HENT:  Negative for hearing loss.        (-)Rhinorrhea   Eyes:        (-)Visual disturbance  Respiratory:  Negative for cough.   Cardiovascular:  Negative for chest pain and leg swelling.  Gastrointestinal:  Negative for blood in stool, constipation, diarrhea, nausea and vomiting.  Genitourinary:  Negative for dysuria and frequency.   Musculoskeletal:  Negative for joint pain and myalgias.  Skin:  Negative for rash.  Neurological:  Negative for headaches.  Psychiatric/Behavioral:  Negative for depression. The patient is not nervous/anxious.       Objective:    Physical Exam Constitutional:      General: She is not in acute distress.    Appearance: Normal appearance. She is not ill-appearing.  HENT:     Head: Normocephalic and atraumatic.     Right Ear: Tympanic membrane, ear canal and external ear normal.     Left Ear: Tympanic membrane, ear canal and external ear normal.  Eyes:     Extraocular Movements:  Extraocular movements intact.     Pupils: Pupils are equal, round, and reactive to light.     Comments: No nystagmus  Neck:     Thyroid: No thyromegaly or thyroid tenderness.  Cardiovascular:     Rate and Rhythm: Normal rate and regular rhythm.     Heart sounds: Murmur heard.  Systolic murmur is present with a grade of 2/6.    No gallop.  Pulmonary:     Effort: Pulmonary effort is normal. No respiratory distress.     Breath sounds: Normal breath sounds. No wheezing or rales.  Abdominal:     General: There is no distension.     Palpations: Abdomen is soft.     Tenderness: There is no abdominal tenderness. There is no guarding.  Musculoskeletal:     Comments: 5/5 strength in both upper and lower extremities  Lymphadenopathy:     Cervical: No cervical adenopathy.  Skin:    General: Skin is warm and dry.  Neurological:     Mental Status: She is alert and oriented to person, place, and time.     Deep Tendon Reflexes:     Reflex Scores:      Patellar reflexes are 2+ on the right side and 2+ on the left side. Psychiatric:        Behavior: Behavior normal.        Judgment: Judgment normal.    BP 118/84 (BP Location: Right Arm, Patient Position: Sitting, Cuff Size: Small)   Pulse 74   Temp 98.7 F (37.1 C) (Oral)   Resp 16   Ht 5' 6.5" (1.689 m)   Wt 170 lb (77.1 kg)   SpO2 100%   BMI 27.03  kg/m  Wt Readings from Last 3 Encounters:  01/31/21 170 lb (77.1 kg)  12/19/20 167 lb (75.8 kg)  11/22/20 171 lb (77.6 kg)       Assessment & Plan:   Problem List Items Addressed This Visit       Unprioritized   Subclinical hyperthyroidism    Recommended that she schedule follow up with her endocrinologist.       Preventative health care - Primary    Discussed healthy diet, exercise. Obtain lipid panel.  Mammo up to date, pap up to date. Declines Tetanus today. Recommended that she obtain covid booster.       Relevant Orders   Lipid panel     No orders of the defined types were placed in this encounter.   I, Charlene Alar NP, personally preformed the services described in this documentation.  All medical record entries made by the scribe were at my direction and in my presence.  I have reviewed the chart and discharge instructions (if applicable) and agree that the record reflects my personal performance and is accurate and complete. 01/31/2021   I,Charlene Carey,acting as a Education administrator for Nance Pear, NP.,have documented all relevant documentation on the behalf of Nance Pear, NP,as directed by  Nance Pear, NP while in the presence of Nance Pear, NP.   Nance Pear, NP

## 2021-01-31 NOTE — Assessment & Plan Note (Signed)
Recommended that she schedule follow up with her endocrinologist.

## 2021-01-31 NOTE — Assessment & Plan Note (Signed)
Discussed healthy diet, exercise. Obtain lipid panel.  Mammo up to date, pap up to date. Declines Tetanus today. Recommended that she obtain covid booster.

## 2021-01-31 NOTE — Patient Instructions (Addendum)
Please schedule a routine vision exam.

## 2021-02-14 LAB — HIV ANTIBODY (ROUTINE TESTING W REFLEX): HIV 1&2 Ab, 4th Generation: NONREACTIVE

## 2021-03-30 ENCOUNTER — Other Ambulatory Visit: Payer: Self-pay | Admitting: Endocrinology

## 2021-03-30 ENCOUNTER — Other Ambulatory Visit (INDEPENDENT_AMBULATORY_CARE_PROVIDER_SITE_OTHER): Payer: No Typology Code available for payment source

## 2021-03-30 ENCOUNTER — Other Ambulatory Visit: Payer: Self-pay

## 2021-03-30 DIAGNOSIS — E042 Nontoxic multinodular goiter: Secondary | ICD-10-CM

## 2021-03-30 LAB — TSH: TSH: 0.29 u[IU]/mL — ABNORMAL LOW (ref 0.35–5.50)

## 2021-03-30 LAB — T3, FREE: T3, Free: 3.3 pg/mL (ref 2.3–4.2)

## 2021-03-30 LAB — T4, FREE: Free T4: 1.21 ng/dL (ref 0.60–1.60)

## 2021-04-03 ENCOUNTER — Ambulatory Visit: Payer: No Typology Code available for payment source | Admitting: Endocrinology

## 2021-04-05 ENCOUNTER — Encounter: Payer: Self-pay | Admitting: Endocrinology

## 2021-04-05 ENCOUNTER — Ambulatory Visit: Payer: No Typology Code available for payment source | Admitting: Endocrinology

## 2021-04-05 ENCOUNTER — Other Ambulatory Visit: Payer: Self-pay

## 2021-04-05 VITALS — BP 118/82 | HR 72 | Wt 172.2 lb

## 2021-04-05 DIAGNOSIS — R7989 Other specified abnormal findings of blood chemistry: Secondary | ICD-10-CM | POA: Diagnosis not present

## 2021-04-05 DIAGNOSIS — E042 Nontoxic multinodular goiter: Secondary | ICD-10-CM

## 2021-04-05 NOTE — Progress Notes (Signed)
Patient ID: Charlene Carey, female   DOB: 09-06-1975, 45 y.o.   MRN: [id number]           Referring provider: Debbrah Alar  Reason for Appointment: Follow-up of thyroid    History of Present Illness:   MULTINODULAR goiter  Previous history: The patient's thyroid enlargement was first discovered in 2010 or so when she was pregnant and her gynecologist noticed a thyroid swelling and also she was told that her thyroid test was slightly abnormal  She was referred to an endocrinologist who sent her for thyroid biopsy, this was done in 2010 on 2 of the dominant nodule she had an was benign Based on her ultrasound and exam she was recommended another ultrasound 6 months after her visit in 01/2017 but she did not come back for follow-up Has had normal thyroid levels before   RECENT HISTORY:  She was last seen in 09/2019 Recently does not feel any increase in the swelling in her neck   Occasionally may feel a little difficulty swallowing but this is not any worse than before and relatively mild No choking or pressure sensation  She was told by her gastroenterologist that her thyroid levels were abnormal However she maintains a slightly low TSH with normal T4 and T3 levels   Lab Results  Component Value Date   FREET4 1.21 03/30/2021   FREET4 1.16 12/20/2020   FREET4 1.17 09/29/2019   TSH 0.29 (L) 03/30/2021   TSH 0.28 (L) 12/19/2020   TSH 0.32 (L) 09/29/2019   Lab Results  Component Value Date   T3FREE 3.3 03/30/2021   T3FREE 3.5 12/20/2020    She has had an ultrasound exam in 5/21 which showed the following  IMPRESSION: 1. Multinodular goiter. 2. Nodule 2 is a TR 3 nodule in the isthmus. Nodule has minimally changed since 2018. Recommend continued follow-up to ensure at least 5 years of stability. 3. Nodule 4 is an isoechoic nodule in the mid right thyroid lobe. This nodule is a TR 3 nodule and meets criteria for 1 year follow-up. 4. Nodule 5 likely represents the  previously biopsied nodule from 2010. This nodule has enlarged in size. 5. Nodule 7 in the posterior mid left thyroid lobe may represent the previously biopsied nodule. This nodule has decreased in size since 2018.  Prior ultrasound in 2018: Right mid nodule measuring 2.6 x 1.9 x 1.7 cm previously measured 2.1 x 1.8 x 1.4 cm. There are calcifications within the nodule. This was previously biopsied in 2010.   Left mid nodule today measuring 2.3 x 1.4 x 1.0 cm previously measured 2.1 x 1.4 x 0.8 cm. This underwent biopsy in 2010.  She also has a right mid nodule which is 1.8 centimeters in maximum size which is solid and hypoechoic with T1 Rads score 4 points  Thyroid biopsy in  2010 showed the following from the 2 nodules, both reports were similar   INTERPRETATION(S):  BENIGN  Comment: There is colloid with admixed lymphocytes suggesting a background of chronic thyroiditis.  There are also follicular epithelial cells predominantly in flat sheets.  The findings suggest a non-neoplastic process such as a non-neoplastic goiter/hyperplastic nodule    Allergies as of 04/05/2021       Reactions   Codeine Nausea Only   Has withdrawal when trying to stop med        Medication List        Accurate as of April 05, 2021  3:27 PM. If you have  any questions, ask your nurse or doctor.          Vitamin B-12 1000 MCG Subl Take 1 tablet by mouth daily.        Allergies:  Allergies  Allergen Reactions   Codeine Nausea Only    Has withdrawal when trying to stop med    Past Medical History:  Diagnosis Date   Allergy-induced asthma    Aortic stenosis with bicuspid valve    Ascending aortic aneurysm    Frequent headaches    Heartburn    History of chicken pox    History of gestational diabetes    Hyperglycemia    Multinodular goiter    Seasonal allergies    Subclinical hyperthyroidism     Past Surgical History:  Procedure Laterality Date   BREAST REDUCTION  SURGERY     OTHER SURGICAL HISTORY     had d& c   RIGHT/LEFT HEART CATH AND CORONARY ANGIOGRAPHY N/A 11/18/2016   Procedure: Right/Left Heart Cath and Coronary Angiography;  Surgeon: Lorretta Harp, MD;  Location: [facility];  Service: Cardiovascular;  Laterality: N/A;   TEE WITHOUT CARDIOVERSION N/A 01/27/2017   Procedure: TRANSESOPHAGEAL ECHOCARDIOGRAM (TEE);  Surgeon: Sanda Klein, MD;  Location: [facility];  Service: Cardiovascular;  Laterality: N/A;    Family History  Problem Relation Age of Onset   Hypertension Mother    Thyroid disease Mother        Hypothyroid   Hyperlipidemia Mother    Diabetes Father    CAD Father    Alcohol abuse Brother    Hypertension Maternal Grandfather    Stroke Maternal Grandfather    Hyperlipidemia Maternal Grandfather    Bipolar disorder Daughter     Social History:  reports that she has quit smoking. She has never used smokeless tobacco. She reports current alcohol use of about 1.0 standard drink per week. She reports that she does not use drugs.   Review of Systems:  Review of Systems Wt Readings from Last 3 Encounters:  04/05/21 172 lb 3.2 oz (78.1 kg)  01/31/21 170 lb (77.1 kg)  12/19/20 167 lb (75.8 kg)        Examination:   BP 118/82   Pulse 72   Wt 172 lb 3.2 oz (78.1 kg)   SpO2 99%   BMI 27.38 kg/m   THYROID is enlarged diffusely but only mildly prominent on inspection  Neck circumference is about 36 cm over the thyroid The right lobe is fleshy texture with firm nodular area in the medial area  Right lobe is about 2-2.5 times normal Small nodule in the isthmus also with some confluence of the right lobe  Left lobe measures about 1.5-2 times normal twice normal, with mild nodularity  There is no stridor  There is no lymphadenopathy.     Biceps reflexes appear normal No tremor   Assessment/Plan:   Multinodular goiter, long-standing, previously on biopsy had shown nonneoplastic goiter with some  lymphocytes Previous clinical notes, radiology reports and cytology reports were reviewed in detail again  Her exam today is unremarkable and the goiter appears relatively smaller on measurement No nodules are unusually prominent Explained to her the nature of multinodular goiter and the fact that her last ultrasound did not show any significant change with low-grade nodules as before Unlikely that current size of the thyroid enlargement would cause dysphagia   Do not recommend another ultrasound this year and will continue to follow clinically  Abnormal TSH: This is minimal  and likely represents mildly autonomous thyroid Unlikely that her chronic symptoms of palpitations are of any significance related to the thyroid Explained to her the rare possibility of One of the nodules becoming overactive and causing hyperthyroidism and explained to her for possible symptoms this may cause  She will come back annually for follow-up  Elayne Snare 04/05/2021   Addendum: Thyroid ultrasound is stable with only slight increase in size of the previously benign nodule.  She will follow-up in April 2022

## 2021-05-09 ENCOUNTER — Other Ambulatory Visit: Payer: Self-pay | Admitting: Obstetrics & Gynecology

## 2021-05-09 DIAGNOSIS — N6489 Other specified disorders of breast: Secondary | ICD-10-CM

## 2021-07-16 ENCOUNTER — Other Ambulatory Visit: Payer: No Typology Code available for payment source

## 2021-11-06 ENCOUNTER — Ambulatory Visit: Payer: No Typology Code available for payment source | Admitting: Cardiovascular Disease

## 2021-11-07 ENCOUNTER — Emergency Department
Admission: EM | Admit: 2021-11-07 | Discharge: 2021-11-07 | Disposition: A | Discharge status: Home / Self Care | Payer: No Typology Code available for payment source | Source: Home / Self Care | Attending: Family Medicine | Admitting: Family Medicine

## 2021-11-07 DIAGNOSIS — R1033 Periumbilical pain: Secondary | ICD-10-CM | POA: Diagnosis not present

## 2021-11-07 NOTE — Discharge Instructions (Signed)
Monitor symptoms. If symptoms become significantly worse during the night or over the weekend, proceed to the local emergency room.

## 2021-11-07 NOTE — ED Notes (Signed)
Charlene Carey verbalized an understanding that she would go to [facility] ED for SOB, increase in abdominal pain or back pain, nausea & vomiting or chills due to cardiac history. Charlene Carey also confirmed she would call her cardiologist in am.

## 2021-11-07 NOTE — ED Triage Notes (Signed)
Pt here today for epigastric pain since this am. Pain has been intermittent. No hx of acid reflux. Normal Bms.

## 2021-11-07 NOTE — ED Provider Notes (Signed)
[facility]    CSN: [id number] Arrival date & time: 11/07/21  1913      History   Chief Complaint Chief Complaint  Patient presents with   Abdominal Pain    HPI Charlene Carey is a 46 y.o. female.   This morning patient developed a vague intermittent pulsating pain in her mid-abdomen.  The pain is mild, does not radiate, and is not colicky.  She denies changes in appetite, nausea/vomiting, and changes in bowel movements.  She notes that the symptoms have decreased and she is assymptomatic at present.  She has a history of aortic stenosis with bicuspid valve, and ascending aortic aneurysm.  The history is provided by the patient.   Past Medical History:  Diagnosis Date   Allergy-induced asthma    Aortic stenosis with bicuspid valve    Ascending aortic aneurysm (HCC)    Frequent headaches    Heartburn    History of chicken pox    History of gestational diabetes    Hyperglycemia    Multinodular goiter    Seasonal allergies    Subclinical hyperthyroidism     Patient Active Problem List   Diagnosis Date Noted   Preventative health care 01/31/2021   Subclinical hyperthyroidism 12/21/2020   Hyperglycemia 12/21/2020   Irregular menses 12/20/2020   Paresthesia 12/20/2020   Vitamin D deficiency 12/20/2020   Carpal tunnel syndrome of left wrist 12/20/2020   Anxiety 12/20/2020   Ascending aortic aneurysm (HCC)    Multinodular goiter    Atypical chest pain 10/16/2016   Dyspnea on exertion 10/16/2016   Aortic stenosis due to bicuspid aortic valve     Past Surgical History:  Procedure Laterality Date   BREAST REDUCTION SURGERY     OTHER SURGICAL HISTORY     had d& c   RIGHT/LEFT HEART CATH AND CORONARY ANGIOGRAPHY N/A 11/18/2016   Procedure: Right/Left Heart Cath and Coronary Angiography;  Surgeon: Lorretta Harp, MD;  Location: [place];  Service: Cardiovascular;  Laterality: N/A;   TEE WITHOUT CARDIOVERSION N/A 01/27/2017   Procedure:  TRANSESOPHAGEAL ECHOCARDIOGRAM (TEE);  Surgeon: Sanda Klein, MD;  Location: [facility];  Service: Cardiovascular;  Laterality: N/A;    OB History   No obstetric history on file.      Home Medications    Prior to Admission medications   Medication Sig Start Date End Date Taking? Authorizing Provider  Cyanocobalamin (VITAMIN B-12) 1000 MCG SUBL Take 1 tablet by mouth daily.    [provider]    Family History Family History  Problem Relation Age of Onset   Hypertension Mother    Thyroid disease Mother        Hypothyroid   Hyperlipidemia Mother    Diabetes Father    CAD Father    Alcohol abuse Brother    Hypertension Maternal Grandfather    Stroke Maternal Grandfather    Hyperlipidemia Maternal Grandfather    Bipolar disorder Daughter     Social History Social History   Tobacco Use   Smoking status: Former   Smokeless tobacco: Never  Scientific laboratory technician Use: Never used  Substance Use Topics   Alcohol use: Yes    Alcohol/week: 1.0 standard drink    Types: 1 Standard drinks or equivalent per week    Comment: occasional glass of wine   Drug use: No     Allergies   Codeine   Review of Systems Review of Systems  Constitutional:  Negative for activity  change, appetite change, chills, diaphoresis, fatigue, fever and unexpected weight change.  HENT: Negative.    Eyes: Negative.   Respiratory: Negative.    Cardiovascular:  Negative for chest pain, palpitations and leg swelling.  Gastrointestinal:  Positive for abdominal pain. Negative for abdominal distention, blood in stool, constipation, diarrhea, nausea and vomiting.  Genitourinary: Negative.   Musculoskeletal: Negative.   Skin: Negative.   Neurological: Negative.     Physical Exam Triage Vital Signs ED Triage Vitals  Enc Vitals Group     BP 11/07/21 1924 137/85     Pulse Rate 11/07/21 1924 78     Resp 11/07/21 1924 18     Temp 11/07/21 1924 98.2 F (36.8 C)     Temp Source 11/07/21  1924 Oral     SpO2 11/07/21 1924 99 %     Weight --      Height --      Head Circumference --      Peak Flow --      Pain Score 11/07/21 1925 3     Pain Loc --      Pain Edu? --      Excl. in [place]? --    No data found.  Updated Vital Signs BP 137/85 (BP Location: Left Arm)   Pulse 78   Temp 98.2 F (36.8 C) (Oral)   Resp 18   SpO2 99%   Visual Acuity Right Eye Distance:   Left Eye Distance:   Bilateral Distance:    Right Eye Near:   Left Eye Near:    Bilateral Near:     Physical Exam Nursing notes and Vital Signs reviewed. Appearance:  Patient appears stated age, and in no acute distress.    Eyes:  Pupils are equal, round, and reactive to light and accomodation.  Extraocular movement is intact.  Conjunctivae are not inflamed   Pharynx:  Normal; moist mucous membranes  Neck:  Supple.  No adenopathy Lungs:  Clear to auscultation.  Breath sounds are equal.  Moving air well. Heart:  Regular rate and rhythm without murmurs, rubs, or gallops.  Abdomen:  Nontender without masses or hepatosplenomegaly.  Bowel sounds are present.  No CVA or flank tenderness.  Extremities:  No edema.  Skin:  No rash present.     UC Treatments / Results  Labs (all labs ordered are listed, but only abnormal results are displayed) Labs Reviewed - No data to display  EKG  Rate:   76 BPM  PR:   122 msec QT:   388 msec QTcH:   436 msec QRSD:   92 msec QRS axis:   55 degrees Interpretation:  No acute changes; normal sinus rhythm   Radiology No results found.  Procedures Procedures (including critical care time)  Medications Ordered in UC Medications - No data to display  Initial Impression / Assessment and Plan / UC Course  I have reviewed the triage vital signs and the nursing notes.  Pertinent labs & imaging results that were available during my care of the patient were reviewed by me and considered in my medical decision making (see chart for details).    Benign exam.  Normal  EKG. With patient's history of ascending aortic aneurysm, concern that her intermittent pulsating peri-umbilical pain may represent an early developing abdominal aortic aneurysm. Recommend that she contact her cardiologist tomorrow for follow-up as soon as possible.   Final Clinical Impressions(s) / UC Diagnoses   Final diagnoses:  Periumbilical abdominal pain  Discharge Instructions      Monitor symptoms. If symptoms become significantly worse during the night or over the weekend, proceed to the local emergency room.     ED Prescriptions   None       Kandra Nicolas, MD 11/09/21 1227

## 2021-11-12 NOTE — Progress Notes (Unsigned)
Cardiology Clinic Note   Patient Name: Charlene Carey Date of Encounter: 11/14/2021  Primary Care Provider:  Debbrah Alar, NP Primary Cardiologist:  Quay Burow, MD  Patient Profile    Charlene Carey 46 year old female presents to the clinic today for follow-up evaluation of her aortic stenosis and a sending aortic aneurysm.  Past Medical History    Past Medical History:  Diagnosis Date   Allergy-induced asthma    Aortic stenosis with bicuspid valve    Ascending aortic aneurysm (HCC)    Frequent headaches    Heartburn    History of chicken pox    History of gestational diabetes    Hyperglycemia    Multinodular goiter    Seasonal allergies    Subclinical hyperthyroidism    Past Surgical History:  Procedure Laterality Date   BREAST REDUCTION SURGERY     OTHER SURGICAL HISTORY     had d& c   RIGHT/LEFT HEART CATH AND CORONARY ANGIOGRAPHY N/A 11/18/2016   Procedure: Right/Left Heart Cath and Coronary Angiography;  Surgeon: Lorretta Harp, MD;  Location: [facility];  Service: Cardiovascular;  Laterality: N/A;   TEE WITHOUT CARDIOVERSION N/A 01/27/2017   Procedure: TRANSESOPHAGEAL ECHOCARDIOGRAM (TEE);  Surgeon: Sanda Klein, MD;  Location: [facility];  Service: Cardiovascular;  Laterality: N/A;    Allergies  Allergies  Allergen Reactions   Codeine Nausea Only    Has withdrawal when trying to stop med    History of Present Illness    Charlene Carey has a PMH of cardiac murmur since childhood, gestational diabetes, ascending aortic aneurysm, and chickenpox.  Her echocardiogram 5/18 showed normal LVEF, progression of her aortic stenosis into the moderate range.  Due to increasing dyspnea on exertion she was sent for left and right heart cath 11/18/2016 which showed normal coronary arteries, mild aortic stenosis, aortic valve calcification that 1.4 cm with peak gradient of 20 mmHg.  Due to the discrepancy between the echocardiogram and  cardiac catheterization she underwent TEE 8/18 which showed moderate aortic stenosis with bicuspid aortic valve and peak aortic gradient of 36 mmHg.  She was seen and evaluated by Dr. Zenia Resides for surgical evaluation.  He did not feel that she required aortic valve replacement at that time.  She was seen by Dr. Gwenlyn Found 521 and was doing well at that time.  Her CTA of her chest and aorta 5/21 showed stable 4.1 cm ascending thoracic aortic aneurysm and a normal imaging was recommended.  Follow-up echocardiogram 6/22 showed LVEF of 60-65%, moderate to severe aortic valve stenosis with a mean gradient of 31.1 mmHg and borderline dilation of the ascending aorta measuring 3.9 cm.  She followed up with Almyra Deforest, PA-C 11/22/2020.  She was planning to undergo colonoscopy 11/27/2020.  She denied significant chest discomfort and worsening DOE.  She was euvolemic.  Her previous 2 echocardiograms were reviewed.  There were no significant changes in the degree of aortic stenosis.  She denied dizziness, vision changes, presyncope and syncope.  She was cleared for her colonoscopy and felt to be low risk.  She presents to clinic today for follow-up evaluation states she was recently in the urgent care clinic for epigastric pain.  She reports that she does note some relief with acid reflux when it is treated with Tums.  Her epigastric type pain is not relieved by Tums or antacid medications.  We reviewed her previous chest CT and echocardiogram.  She denies episodes of chest or back discomfort.  She  remains physically active but does report increased stress.  I will repeat her echocardiogram chest CT.  Her epigastric pain seems to be related to GI.  She is established with GI asked her to follow-up with them for her epigastric discomfort.  We will plan follow-up for 12 months.  Today she denies chest pain, shortness of breath, lower extremity edema, fatigue, palpitations, melena, hematuria, hemoptysis, diaphoresis, weakness,  presyncope, syncope, orthopnea, and PND.   Home Medications    Prior to Admission medications   Medication Sig Start Date End Date Taking? Authorizing Provider  Cyanocobalamin (VITAMIN B-12) 1000 MCG SUBL Take 1 tablet by mouth daily.    [provider]    Family History    Family History  Problem Relation Age of Onset   Hypertension Mother    Thyroid disease Mother        Hypothyroid   Hyperlipidemia Mother    Diabetes Father    CAD Father    Alcohol abuse Brother    Hypertension Maternal Grandfather    Stroke Maternal Grandfather    Hyperlipidemia Maternal Grandfather    Bipolar disorder Daughter    She indicated that her mother is alive. She indicated that her father is alive. She indicated that her brother is alive. She indicated that her maternal grandmother is deceased. She indicated that her maternal grandfather is deceased. She indicated that her paternal grandmother is alive. She indicated that her paternal grandfather is alive. She indicated that her daughter is alive. She indicated that her son is alive.  Social History    Social History   Socioeconomic History   Marital status: Married    Spouse name: Not on file   Number of children: Not on file   Years of education: Not on file   Highest education level: Not on file  Occupational History   Not on file  Tobacco Use   Smoking status: Former   Smokeless tobacco: Never  Vaping Use   Vaping Use: Never used  Substance and Sexual Activity   Alcohol use: Yes    Alcohol/week: 1.0 standard drink    Types: 1 Standard drinks or equivalent per week    Comment: occasional glass of wine   Drug use: No   Sexual activity: Yes    Partners: Male  Other Topics Concern   Not on file  Social History Narrative   Homeschools    [name]- born in 1997   [name]-1999   [name]- 2011 (from second marriage)- homeschools   She is a stay at home mom   No pets   Completed 2 years of college    married (second  marriage)   Enjoys Control and instrumentation engineer, gardening, herb garden   Daughter and grandson live with her   Social Determinants of Radio broadcast assistant Strain: Not on file  Food Insecurity: Not on file  Transportation Needs: Not on file  Physical Activity: Not on file  Stress: Not on file  Social Connections: Not on file  Intimate Partner Violence: Not on file     Review of Systems    General:  No chills, fever, night sweats or weight changes.  Cardiovascular:  No chest pain, dyspnea on exertion, edema, orthopnea, palpitations, paroxysmal nocturnal dyspnea. Dermatological: No rash, lesions/masses Respiratory: No cough, dyspnea Urologic: No hematuria, dysuria Abdominal:   No nausea, vomiting, diarrhea, bright red blood per rectum, melena, or hematemesis Neurologic:  No visual changes, wkns, changes in mental status. All other systems reviewed and are otherwise negative  except as noted above.  Physical Exam    VS:  BP 110/78   Pulse 77   Ht '5\' 6"'$  (1.676 m)   Wt 175 lb 6.4 oz (79.6 kg)   SpO2 98%   BMI 28.31 kg/m  , BMI Body mass index is 28.31 kg/m. GEN: Well nourished, well developed, in no acute distress. HEENT: normal. Neck: Supple, no JVD, carotid bruits, or masses. Cardiac: RRR, 2/6 systolic murmur heard along right sternal border, rubs, or gallops. No clubbing, cyanosis, edema.  Radials/DP/PT 2+ and equal bilaterally.  Respiratory:  Respirations regular and unlabored, clear to auscultation bilaterally. GI: Soft, nontender, nondistended, BS + x 4. MS: no deformity or atrophy. Skin: warm and dry, no rash. Neuro:  Strength and sensation are intact. Psych: Normal affect.  Accessory Clinical Findings    Recent Labs: 12/19/2020: ALT 15; BUN 14; Creatinine, Ser 0.91; Hemoglobin 14.5; Platelets 201.0; Potassium 5.1; Sodium 138 03/30/2021: TSH 0.29   Recent Lipid Panel    Component Value Date/Time   CHOL 183 01/31/2021 0943   TRIG 116.0 01/31/2021 0943   HDL 46.30 01/31/2021  0943   CHOLHDL 4 01/31/2021 0943   VLDL 23.2 01/31/2021 0943   LDLCALC 114 (H) 01/31/2021 0943    ECG personally reviewed by me today-none today.  Echocardiogram 11/15/2020 IMPRESSIONS     1. Left ventricular ejection fraction, by estimation, is 60 to 65%. The  left ventricle has normal function. The left ventricle has no regional  wall motion abnormalities. Left ventricular diastolic parameters were  normal.   2. Right ventricular systolic function is normal. The right ventricular  size is normal. There is normal pulmonary artery systolic pressure.   3. The mitral valve is normal in structure. No evidence of mitral valve  regurgitation. No evidence of mitral stenosis.   4. The aortic valve is bicuspid. Aortic valve regurgitation is not  visualized. Moderate to severe aortic valve stenosis. Aortic valve mean  gradient measures 31.1 mmHg. Aortic valve Vmax measures 3.65 m/s. Aortic  valve acceleration time measures 81 msec.   5. No coarctation is seen. There is borderline dilatation of the  ascending aorta, measuring 39 mm.   6. The inferior vena cava is normal in size with greater than 50%  respiratory variability, suggesting right atrial pressure of 3 mmHg.   Comparison(s): A prior study was performed on 10/27/2019. No significant  change from prior study. Prior images reviewed side by side.  Cardiac catheterization 11/18/2016 Hemodynamic findings consistent with aortic valve stenosis.   Charlene Carey is a 46 y.o. female      [id number] LOCATION:  FACILITY: [facility]  PHYSICIAN: Quay Burow, M.D. 16-May-1976     DATE OF PROCEDURE:  11/18/2016   DATE OF DISCHARGE:       CARDIAC CATHETERIZATION        History obtained from chart review.Charlene Carey is a very pleasant 46 year old mildly overweight married Caucasian female mother of 3 children and is accompanied by her son [name] today. She saw Dr. Martinique in the remote past (2011). I last saw her in the office  02/14/15. There is a history of a murmur from childhood with what she describes as a bicuspid aortic valve. Dr. Martinique said that she would need this addressed at some point in the future. She has no other cardiovascular risk factors. She has noticed increasing dyspnea on exertion when walking up stairs and fatigue with some chest heaviness. She has had no problems with her deliveries. There is no family  history of bicuspid aortic valve sclerosis. Since I saw her last she's gained approximately 15 pounds. She does complain of increasing dyspnea on exertion and some chest burning which she attributes to repeat reflux. A 2-D echocardiogram performed 10/29/16 revealed normal LV function with progression of her aortic stenosis now in the moderate range. Gradient was 54 mmHg with a mean of 35 and a valve area 0.89 cm. She recently was at [facility] and  when trying to keep up with her children and husband noticed increasing dyspnea and chest. Burning rating to her back. Based on this we decided to proceed with outpatient right and left heart cath to define her anatomy and physiology.       IMPRESSION: Mrs. Cantave has normal coronary arteries, mild aortic stenosis and a dilated aortic root. She'll need CT angiography of her aorta. At this point, I do not think that her aortic valve is tight enough to require intervention. I performed right common femoral angiogram and MYNX closed right common femoral puncture site. She will be discharged home today as an outpatient and I will see her back in 2-3 weeks for follow-up.   Quay Burow. MD, [facility] 11/18/2016 4:00 PM  CT angio chest aorta 12/27/2020 FINDINGS: Cardiovascular: Stable fusiform aneurysmal dilatation of the ascending thoracic aorta extending into the proximal aortic arch.   Ascending thoracic aorta maximal diameter remains 4.1 cm. Proximal aortic arch dilatation remains 3.8 cm.   Patent 3 vessel arch anatomy. Descending thoracic aorta  remains normal in caliber.   No mediastinal hemorrhage or hematoma. No interval acute dissection.   Central pulmonary arteries are patent and normal in caliber. Negative for acute pulmonary embolus. Normal heart size. No pericardial effusion. Central venous structures are patent. No veno-occlusive process. Pulmonary veins are patent. No anomalous pulmonary venous return appreciated.   Mediastinum/Nodes: Stable enlarged heterogeneous multinodular thyroid which has been previously evaluated by ultrasound.   Trachea and central airways are patent. Esophagus nondilated. No hiatal hernia. No bulky adenopathy.   Lungs/Pleura: Lungs are clear. No pleural effusion or pneumothorax.   Upper Abdomen: No acute abnormality.   Musculoskeletal: No chest wall abnormality. No acute or significant osseous findings.   Review of the MIP images confirms the above findings.   IMPRESSION: Stable 4.1 cm ascending thoracic aortic aneurysm extending to involve the proximal arch as above.   Recommend annual imaging followup by CTA or MRA. This recommendation follows 2010 ACCF/AHA/AATS/ACR/ASA/SCA/SCAI/SIR/STS/SVM Guidelines for the Diagnosis and Management of Patients with Thoracic Aortic Disease. Circulation. 2010; 121: P950-D326. Aortic aneurysm NOS (ICD10-I71.9) no other acute intrathoracic finding.     Electronically Signed   By: Jerilynn Mages.  Shick M.D.   On: 12/27/2020 13:36  Assessment & Plan   1.  Aortic stenosis-no increased DOE or activity intolerance.  Underwent left and right heart cath which showed discrepancy between transthoracic echocardiogram.  Echocardiogram 11/15/2020 showed bicuspid aortic valve with moderate-severe aortic stenosis and a mean gradient of 31 mmHg. Repeat echocardiogram  Thoracic aortic aneurysm-denies recent episodes of chest or back discomfort.  CT chest angio aorta showed ascending thoracic aorta diameter of 4.1 cm proximal aortic arch 3.8 cm 12/27/2020 Repeat  CT angio  chest aorta 12/27/2021  Epigastric pain-no relief with antacids.  Has been present for several days.  Is established with gastroenterology and underwent recent colonoscopy.  Has not noticed any pattern with beverages or food that increase discomfort. Follow up with GI  Disposition: Follow-up with Dr. Gwenlyn Found or me in 12 months.   Denyse Amass  Dagmar Hait NP-C    11/14/2021, 9:51 AM [address] Office [phone] Fax [phone]  Notice: This dictation was prepared with Dragon dictation along with smaller phrase technology. Any transcriptional errors that result from this process are unintentional and may not be corrected upon review.  I spent 14 minutes examining this patient, reviewing medications, and using patient centered shared decision making involving her cardiac care.  Prior to her visit I spent greater than 20 minutes reviewing her past medical history,  medications, and prior cardiac tests.

## 2021-11-14 ENCOUNTER — Ambulatory Visit (INDEPENDENT_AMBULATORY_CARE_PROVIDER_SITE_OTHER): Payer: No Typology Code available for payment source | Admitting: General Practice

## 2021-11-14 ENCOUNTER — Encounter: Payer: Self-pay | Admitting: General Practice

## 2021-11-14 VITALS — BP 110/78 | HR 77 | Ht 66.0 in | Wt 175.4 lb

## 2021-11-14 DIAGNOSIS — I712 Thoracic aortic aneurysm, without rupture, unspecified: Secondary | ICD-10-CM

## 2021-11-14 DIAGNOSIS — Q230 Congenital stenosis of aortic valve: Secondary | ICD-10-CM | POA: Diagnosis not present

## 2021-11-14 DIAGNOSIS — R1013 Epigastric pain: Secondary | ICD-10-CM | POA: Diagnosis not present

## 2021-11-14 DIAGNOSIS — Q231 Congenital insufficiency of aortic valve: Secondary | ICD-10-CM

## 2021-11-14 NOTE — Patient Instructions (Signed)
Medication Instructions:  The current medical regimen is effective;  continue present plan and medications as directed. Please refer to the Current Medication list given to you today.  *If you need a refill on your cardiac medications before your next appointment, please call your pharmacy*  Lab Work:    BMET FOR CT If you have labs (blood work) drawn today and your tests are completely normal, you will receive your results only by: [place] (if you have MyChart) OR  A paper copy in the mail If you have any lab test that is abnormal or we need to change your treatment, we will call you to review the results.  Testing/Procedures:  Echocardiogram - Your physician has requested that you have an echocardiogram. Echocardiography is a painless test that uses sound waves to create images of your heart. It provides your doctor with information about the size and shape of your heart and how well your heart's chambers and valves are working. This procedure takes approximately one hour. There are no restrictions for this procedure.   CT Angiography Chest (CTA), is a special type of CT scan that uses a computer to produce multi-dimensional views of major blood vessels throughout the body. In CT angiography, a contrast material is injected through an IV to help visualize the blood vessels   Special Instructions MAKE SURE TO CALL YOUR GI MD FOR APPT  Follow-Up: Your next appointment:  12 month(s) In Person with Quay Burow, MD     Please call our office 2 months in advance to schedule this appointment   At [facility], you and your health needs are our priority.  As part of our continuing mission to provide you with exceptional heart care, we have created designated Provider Care Teams.  These Care Teams include your primary Cardiologist (physician) and Advanced Practice Providers (APPs -  Physician Assistants and Nurse Practitioners) who all work together to provide you with the care you  need, when you need it.    Important Information About Sugar

## 2021-11-15 ENCOUNTER — Ambulatory Visit
Admission: RE | Admit: 2021-11-15 | Discharge: 2021-11-15 | Disposition: A | Discharge status: Home / Self Care | Payer: No Typology Code available for payment source | Source: Ambulatory Visit | Attending: Obstetrics & Gynecology | Admitting: Obstetrics & Gynecology

## 2021-11-15 DIAGNOSIS — N6489 Other specified disorders of breast: Secondary | ICD-10-CM

## 2021-11-15 IMAGING — MR MR BREAST BILAT WO/W CM
8 of 12 series · 32 of 48 positions shown · IV contrast (8ml gadavist)
Comparison: Bilateral screening mammogram dated [DATE] at [facility]
mammography.

CLINICAL DATA: Follow-up right breast MR guided core needle biopsy
demonstrating pseudoangiomatous stromal hyperplasia in the superior
aspect of the breast. The patient has a family history of breast
cancer in her mother diagnosed at age 60, maternal aunt diagnosed at
age 50 and maternal great grandmother diagnosed at age 35. Previous
bilateral reduction mammoplasty at age 23.

EXAM:
BILATERAL BREAST MRI WITH AND WITHOUT CONTRAST
TECHNIQUE: Multiplanar, multisequence MR images of both breasts were obtained
prior to and following the intravenous administration of ml of
Gadavist

[Series 2: t2_tirm_tra ipat (a-p) · axial · 3.0mm · 0.78mm/px · 1 of 57 slices shown]
[im 1/57]
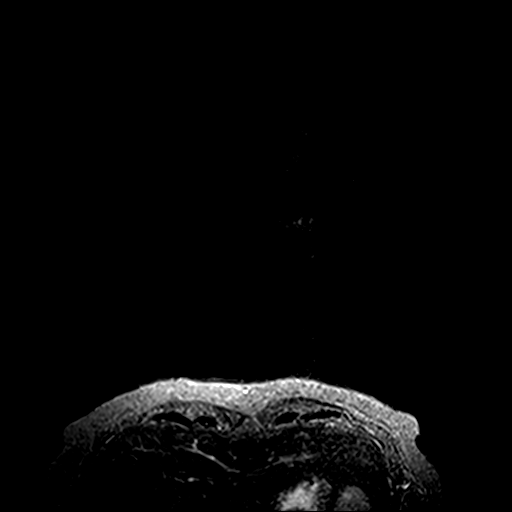

[Series 3: fl3d pre-cm no · axial · non-contrast · 1.2mm · 1.04mm/px · z∈[-62,+109]mm · 5 of 144 slices shown]
[im 1/144]
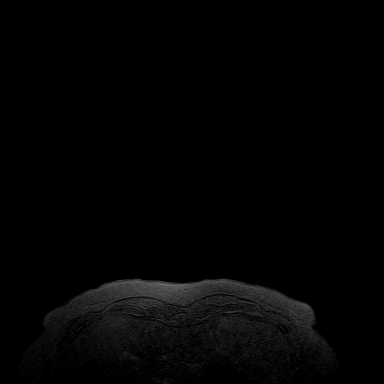
[im 36/144]
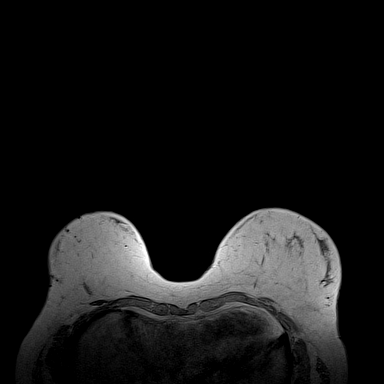
[im 72/144]
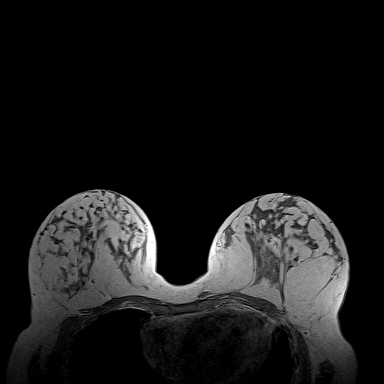
[im 108/144]
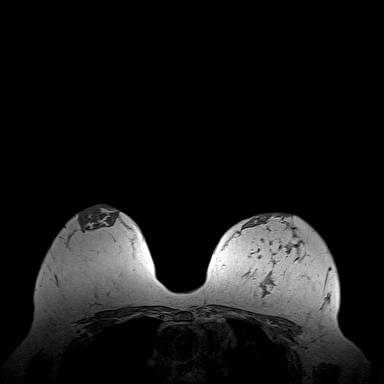
[im 144/144]
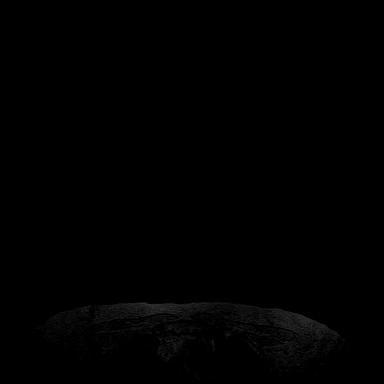

[Series 4: fl3d pre-cm · axial · non-contrast · 1.2mm · 1.04mm/px · z∈[-62,+109]mm · 5 of 144 slices shown]
[im 1/144]
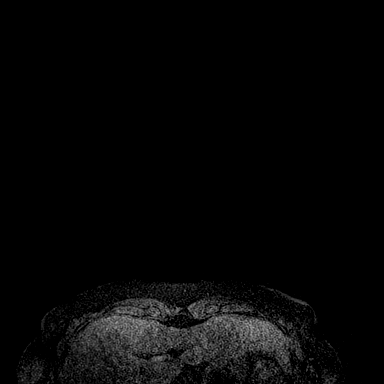
[im 36/144]
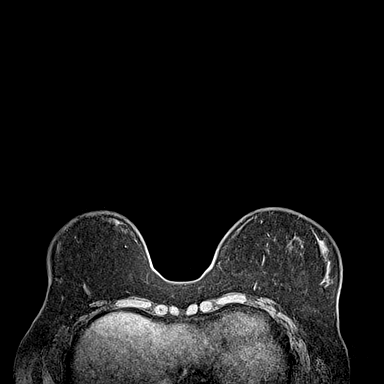
[im 72/144]
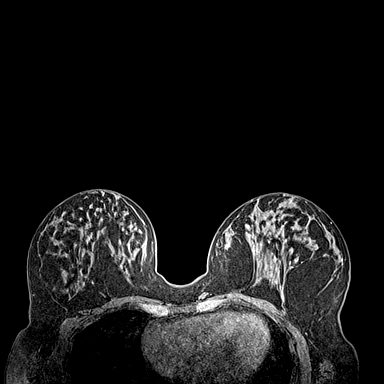
[im 108/144]
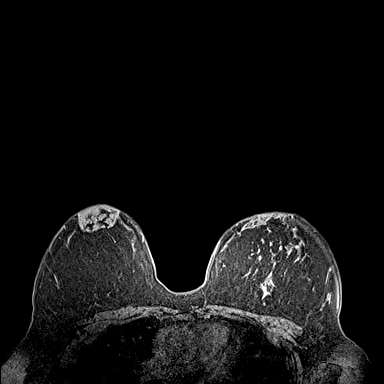
[im 144/144]
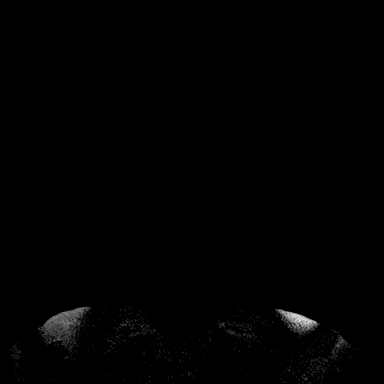

[Series 5: fl3d post-cm 20 · axial · 1.2mm · 1.04mm/px · z∈[-62,+109]mm · 5 of 144 slices shown (1 of 3)]
[im 1/144]
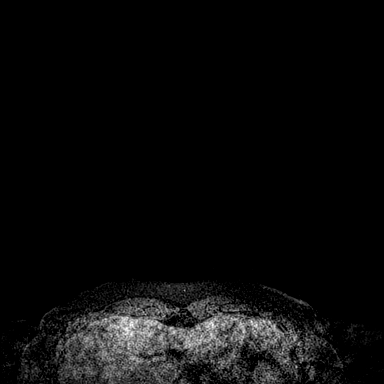
[im 36/144]
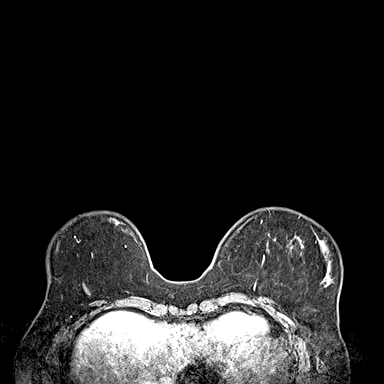
[im 72/144]
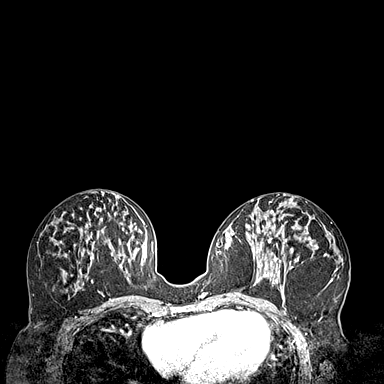
[im 108/144]
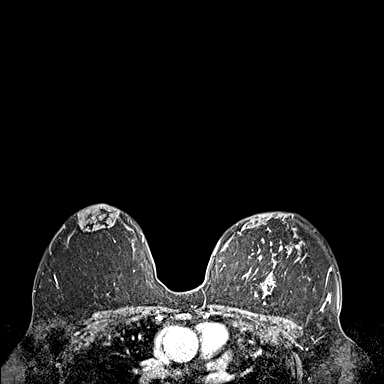
[im 144/144]
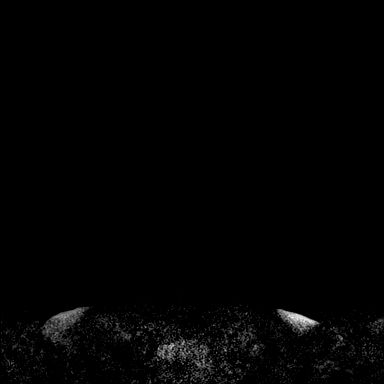

[Series 6: fl3d post-cm 20 · axial · 1.2mm · 1.04mm/px · z∈[-62,+109]mm · 5 of 144 slices shown (2 of 3)]
[im 1/144]
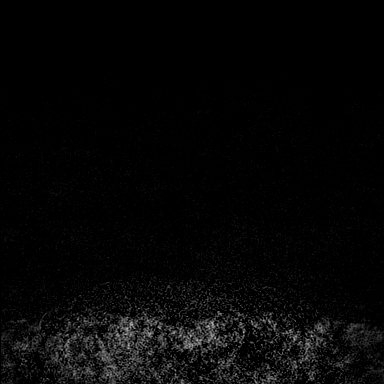
[im 36/144]
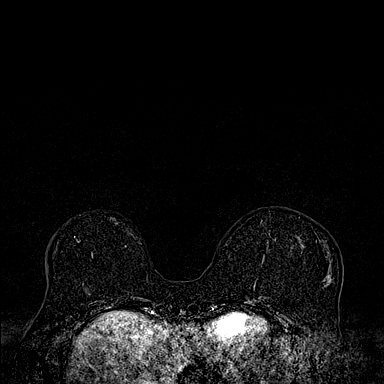
[im 72/144]
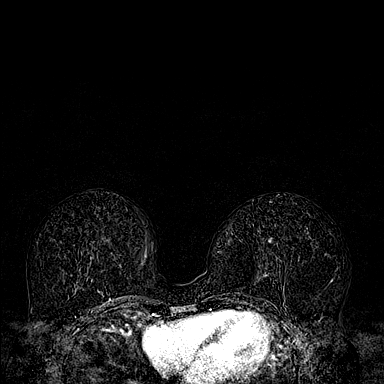
[im 108/144]
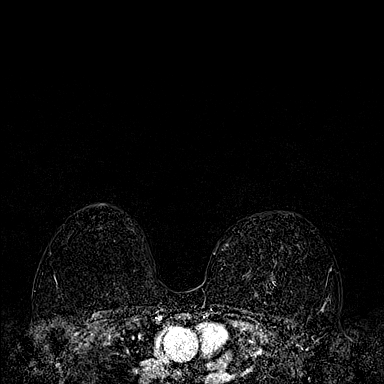
[im 144/144]
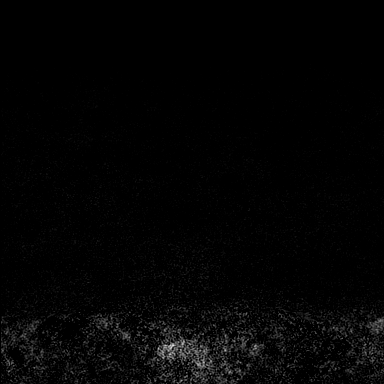

[Series 7: fl3d post-cm 20 · axial · 172.8mm · 1.04mm/px · 1 of 1 slices shown (3 of 3)]
[im 1/1]
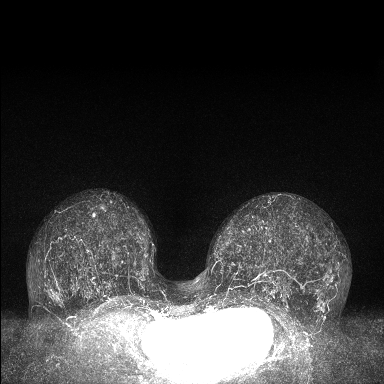

[Series 8: fl3d post-cm 3 · axial · 1.2mm · 1.04mm/px · z∈[-62,+109]mm · 6 of 144 slices shown (1 of 2)]
[im 1/144]
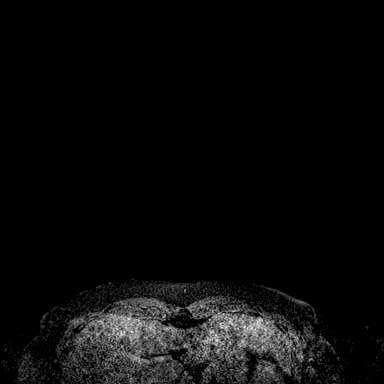
[im 29/144]
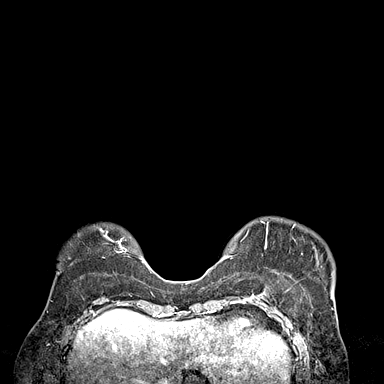
[im 58/144]
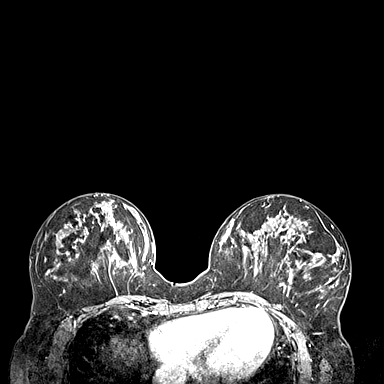
[im 86/144]
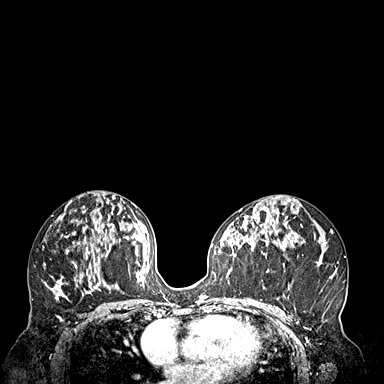
[im 115/144]
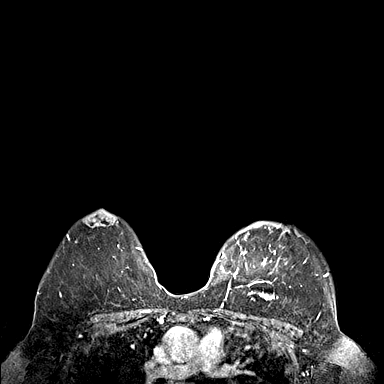
[im 144/144]
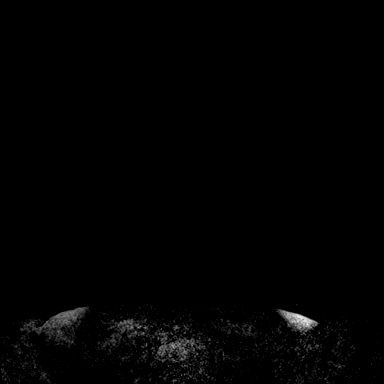

[Series 9: fl3d post-cm 3 · axial · 1.2mm · 1.04mm/px · z∈[-62,+40]mm · 4 of 144 slices shown (2 of 2)]
[im 1/144]
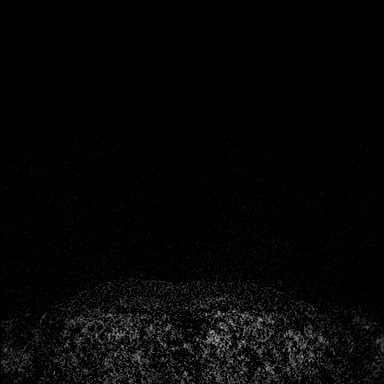
[im 29/144]
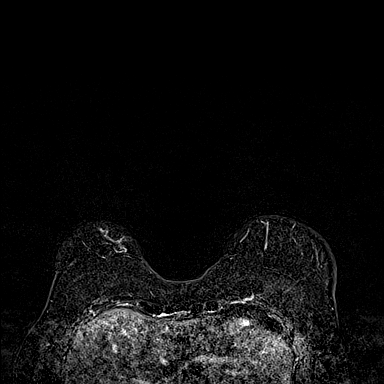
[im 58/144]
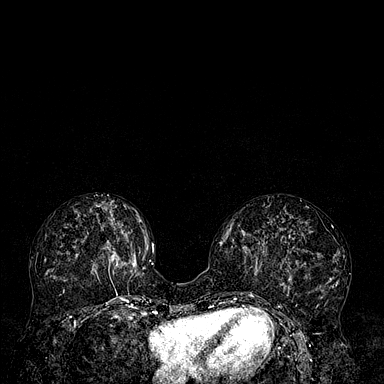
[im 86/144]
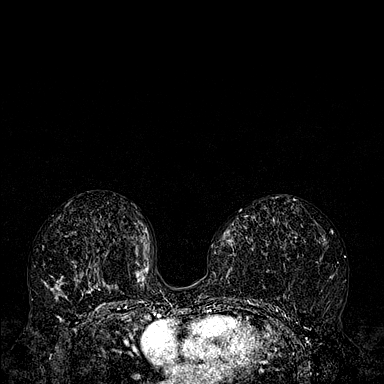

[32 of 48 positions shown; findings below may reference images not displayed]

Three-dimensional MR images were rendered by post-processing of the
original MR data on an independent workstation. The
three-dimensional MR images were interpreted, and findings are
reported in the following complete MRI report for this study. Three
dimensional images were evaluated at the independent interpreting
workstation using the DynaCAD thin client.
Bilateral breast MRI dated [DATE] and right breast
MR guided core needle biopsy dated [DATE].
FINDINGS: Breast composition: c. Heterogeneous fibroglandular tissue.

Background parenchymal enhancement: Moderate

Right breast: The previously biopsied 1.4 x 0.4 cm linear area of
enhancement in the upper central right breast is smaller following
biopsy, with an adjacent biopsy marker clip artifact. This currently
measures 1.0 x 0.4 cm on image number 47/9. This was shown to
represent pseudoangiomatous stromal hyperplasia on the biopsy dated
[DATE].

No new masses or areas of enhancement suspicious for malignancy.

Left breast: No mass or abnormal enhancement.

Lymph nodes: No abnormal appearing lymph nodes.

Ancillary findings:  None.
IMPRESSION: No evidence of malignancy.

RECOMMENDATION:
1. Bilateral screening mammogram in [DATE] when due.
2. Per American Cancer Society guidelines, if the patient has a
calculated lifetime risk of developing breast cancer of greater than
20%, annual screening MRI of the breasts would also be recommended.

BI-RADS CATEGORY  2: Benign.

## 2021-11-15 MED ORDER — GADOBUTROL 1 MMOL/ML IV SOLN
8.0000 mL | Freq: Once | INTRAVENOUS | Status: AC | PRN
Start: 2021-11-15 — End: 2021-11-15
  Administered 2021-11-15: 8 mL via INTRAVENOUS

## 2021-11-19 ENCOUNTER — Ambulatory Visit (HOSPITAL_COMMUNITY): Payer: No Typology Code available for payment source | Attending: Cardiovascular Disease

## 2021-11-19 DIAGNOSIS — Q231 Congenital insufficiency of aortic valve: Secondary | ICD-10-CM | POA: Diagnosis not present

## 2021-11-19 DIAGNOSIS — Q230 Congenital stenosis of aortic valve: Secondary | ICD-10-CM | POA: Insufficient documentation

## 2021-11-19 LAB — ECHOCARDIOGRAM COMPLETE
AR max vel: 0.82 cm2
AV Area VTI: 0.82 cm2
AV Area mean vel: 0.87 cm2
AV Mean grad: 33.4 mmHg
AV Peak grad: 52.1 mmHg
Ao pk vel: 3.61 m/s
Area-P 1/2: 4.39 cm2
P 1/2 time: 428 msec
S' Lateral: 2.7 cm

## 2021-11-19 LAB — BASIC METABOLIC PANEL
BUN/Creatinine Ratio: 15 (ref 9–23)
BUN: 12 mg/dL (ref 6–24)
CO2: 24 mmol/L (ref 20–29)
Calcium: 9.8 mg/dL (ref 8.7–10.2)
Chloride: 105 mmol/L (ref 96–106)
Creatinine, Ser: 0.8 mg/dL (ref 0.57–1.00)
Glucose: 91 mg/dL (ref 70–99)
Potassium: 4.8 mmol/L (ref 3.5–5.2)
Sodium: 141 mmol/L (ref 134–144)
eGFR: 91 mL/min/{1.73_m2} (ref >59)

## 2021-11-22 ENCOUNTER — Ambulatory Visit (HOSPITAL_COMMUNITY)
Admission: RE | Admit: 2021-11-22 | Discharge: 2021-11-22 | Disposition: A | Discharge status: Home / Self Care | Payer: No Typology Code available for payment source | Source: Ambulatory Visit | Attending: Cardiovascular Disease | Admitting: Cardiovascular Disease

## 2021-11-22 DIAGNOSIS — I712 Thoracic aortic aneurysm, without rupture, unspecified: Secondary | ICD-10-CM | POA: Diagnosis not present

## 2021-11-22 IMAGING — CT CT ANGIO CHEST
3 of 7 series · 18 of 46 positions shown · IV contrast (agent unspecified)
Comparison: [DATE]

CLINICAL DATA: Aortic aneurysm, known or suspected

EXAM:
CT ANGIOGRAPHY CHEST WITH CONTRAST
TECHNIQUE: Multidetector CT imaging of the chest was performed using the
standard protocol during bolus administration of intravenous
contrast. Multiplanar CT image reconstructions and MIPs were
obtained to evaluate the vascular anatomy.

[Series 5: arterial · axial · arterial · 0.58mm/px · z∈[+1058,+1328]mm · 12 of 161 slices shown]
[im 13/161  lung]
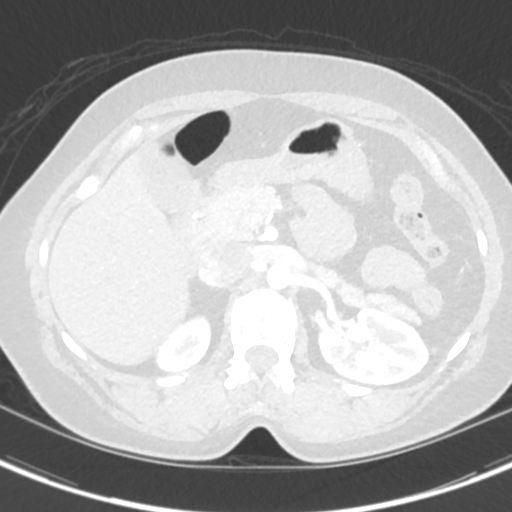
[im 25/161  soft-tissue]
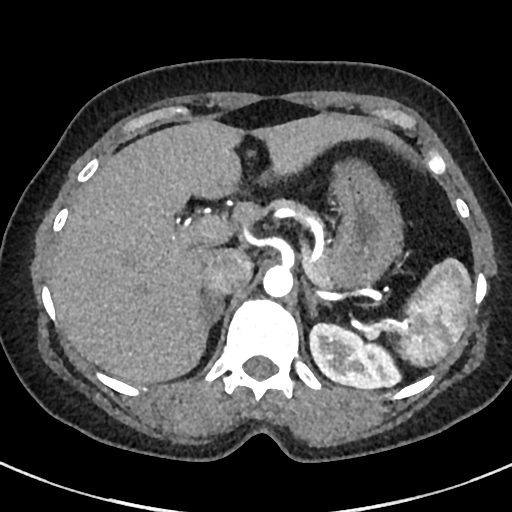
[im 37/161  lung]
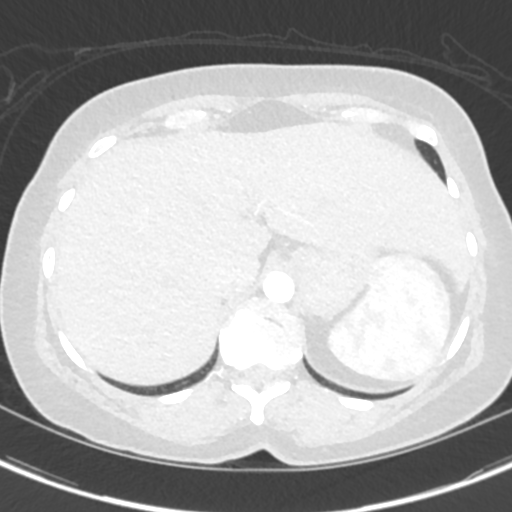
[im 50/161  soft-tissue]
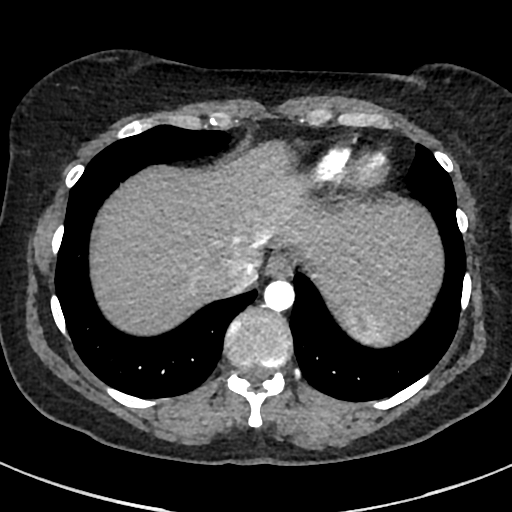
[im 62/161  lung]
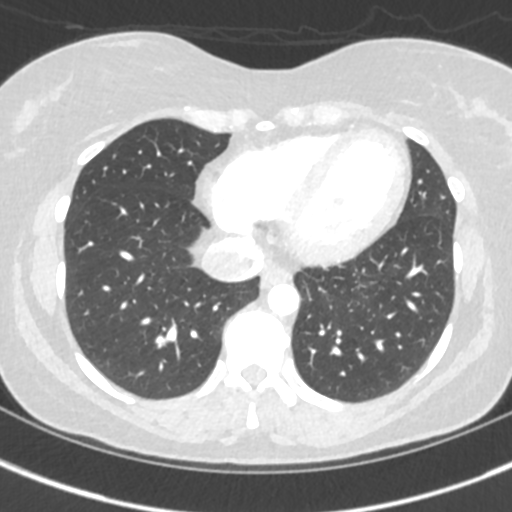
[im 74/161  soft-tissue]
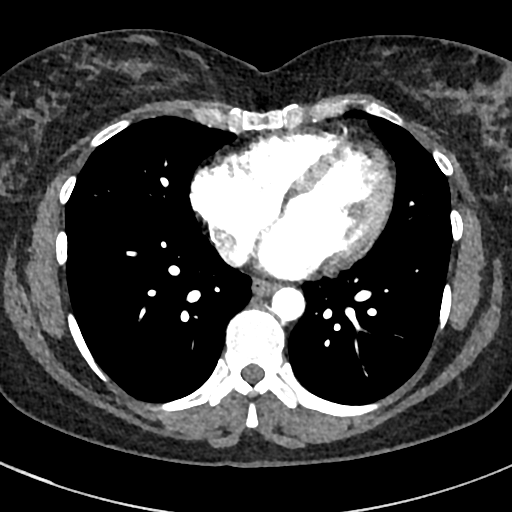
[im 87/161  lung]
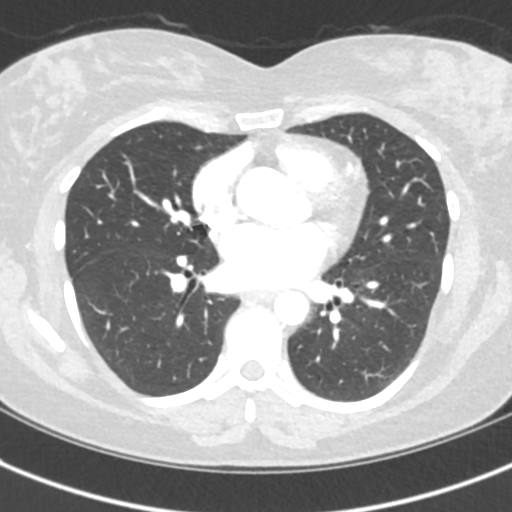
[im 99/161  soft-tissue]
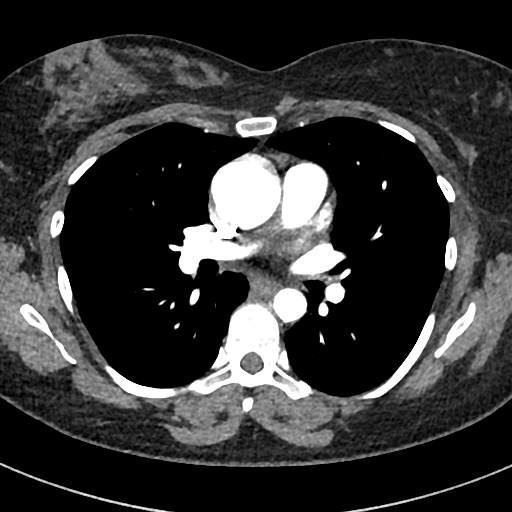
[im 111/161  lung]
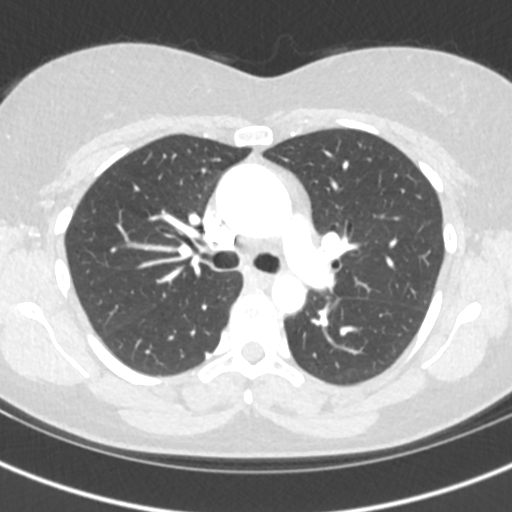
[im 124/161  soft-tissue]
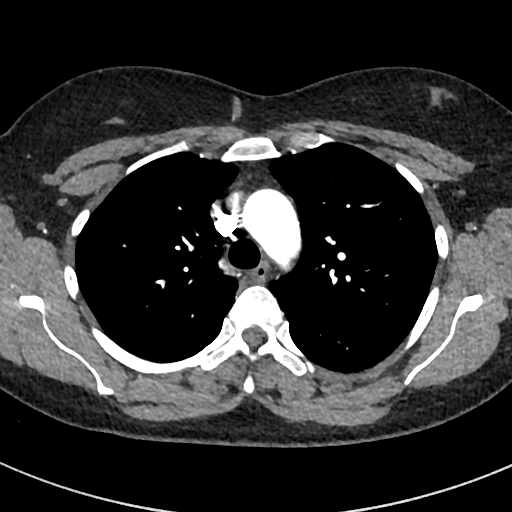
[im 136/161  lung]
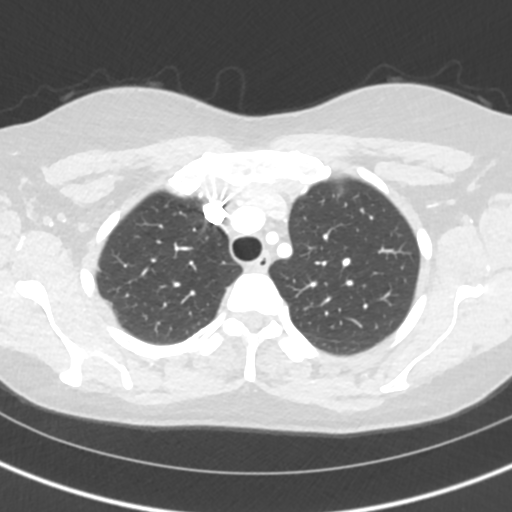
[im 148/161  soft-tissue]
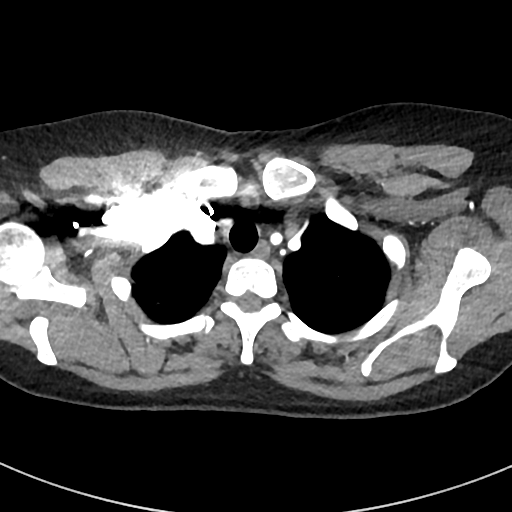

[Series 7: lung · axial · 0.58mm/px · z∈[+1060,+1140]mm · 3 of 161 slices shown]
[im 14/161  soft-tissue]
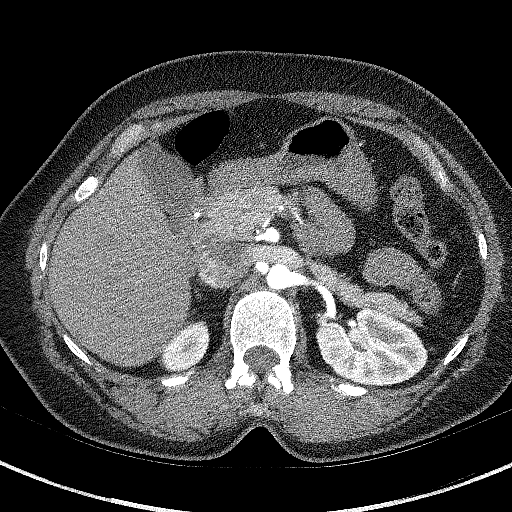
[im 41/161  soft-tissue]
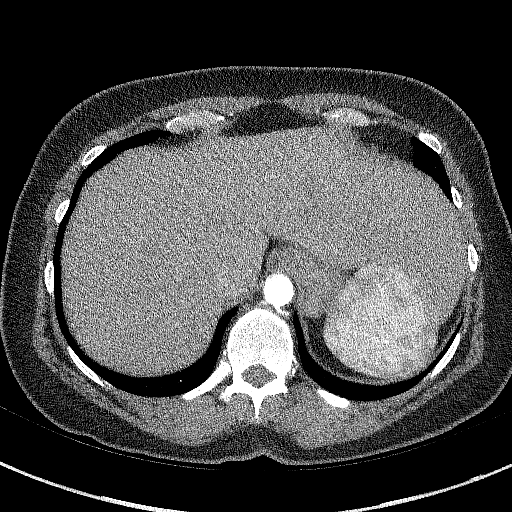
[im 54/161  soft-tissue]
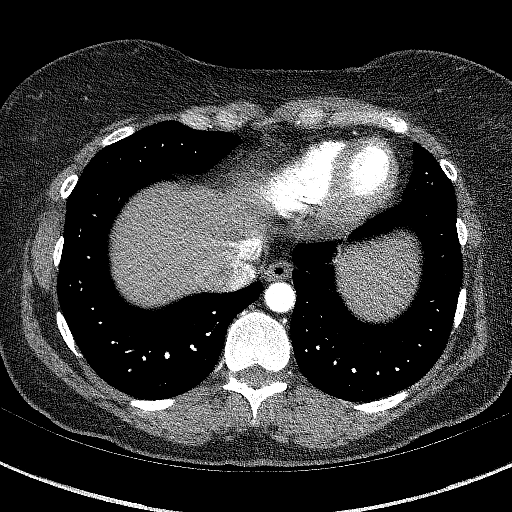

[Series 9: cor · coronal · 0.64mm/px · 3 of 150 slices shown]
[im 38/150  soft-tissue]
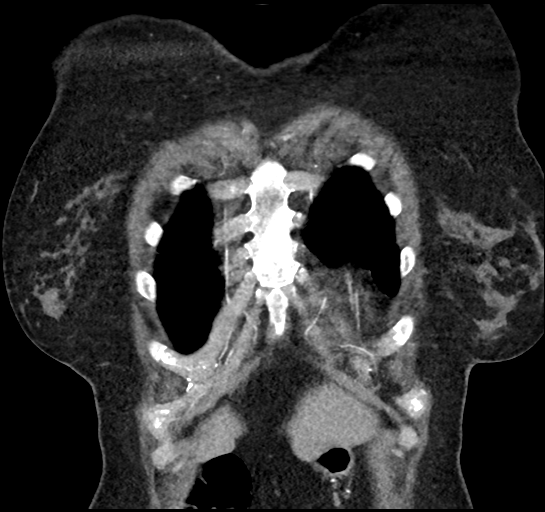
[im 75/150  soft-tissue]
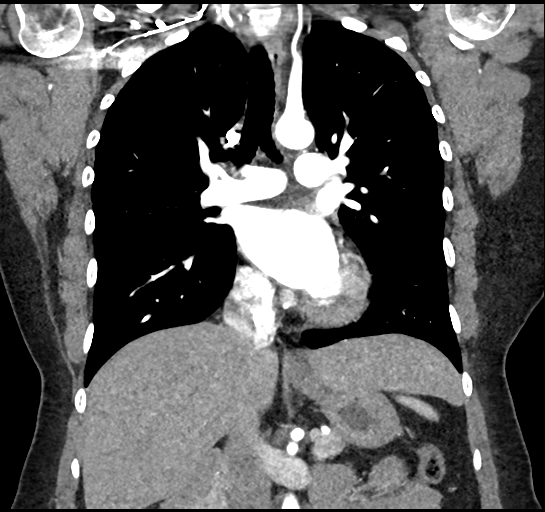
[im 112/150  soft-tissue]
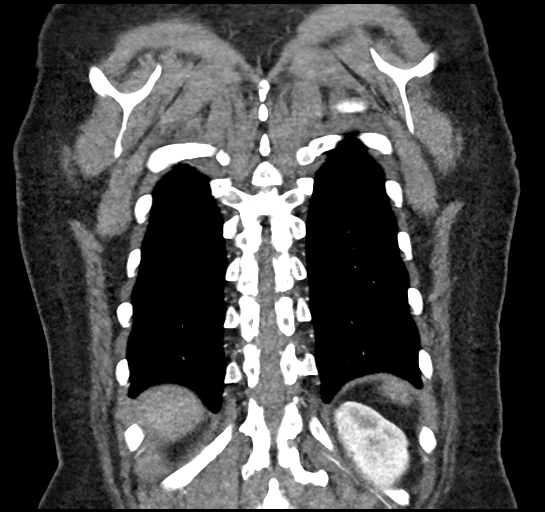

[18 of 46 positions shown; findings below may reference images not displayed]

RADIATION DOSE REDUCTION: This exam was performed according to the
departmental dose-optimization program which includes automated
exposure control, adjustment of the mA and/or kV according to
patient size and/or use of iterative reconstruction technique.

CONTRAST:  100mL OMNIPAQUE IOHEXOL 350 MG/ML SOLN
FINDINGS: Cardiovascular: Preferential opacification of the thoracic aorta.
Again seen is the mild aneurysmal dilatation of the ascending
thoracic aorta measuring 4 cm in greatest dimension. Proximal arch
of the aorta measures 3.5 cm. No dissection or intramural hematoma.
Main pulmonary artery and its branches up to the segmental divisions
have a normal appearance without PE. Normal heart size. No
pericardial effusion.

Mediastinum/Nodes: No enlarged mediastinal, hilar, or axillary lymph
nodes. Stable enlarged heterogeneous multinodular thyroid which has
been previously evaluated by ultrasound. Mild circumferential
thickening of the distal esophagus.

Lungs/Pleura: Lungs are clear. No pleural effusion or pneumothorax.

Upper Abdomen: No acute abnormality.

Musculoskeletal: No chest wall abnormality. No acute or significant
osseous findings.

Review of the MIP images confirms the above findings.
IMPRESSION: Ascending thoracic aorta measures 4 cm in greatest diameter without
significant interval change.

Recommend annual imaging followup by CTA or MRA. This recommendation
follows [07] ACCF/AHA/AATS/ACR/ASA/SCA/YUKUN/YUKUN/YUKUN/YUKUN Guidelines
for the Diagnosis and Management of Patients with Thoracic Aortic
Disease. Circulation. [07]; 121: E266-e369. Aortic aneurysm NOS
([07]-[07])

Stable enlarged heterogeneous multinodular thyroid which has been
previously evaluated by ultrasound.

## 2021-11-22 MED ORDER — IOHEXOL 350 MG/ML SOLN
100.0000 mL | Freq: Once | INTRAVENOUS | Status: AC | PRN
  Administered 2021-11-22: 100 mL via INTRAVENOUS

## 2022-03-02 ENCOUNTER — Ambulatory Visit
Admission: EM | Admit: 2022-03-02 | Discharge: 2022-03-02 | Disposition: A | Discharge status: Home / Self Care | Payer: No Typology Code available for payment source | Attending: Urgent Care | Admitting: Urgent Care

## 2022-03-02 ENCOUNTER — Other Ambulatory Visit: Payer: Self-pay

## 2022-03-02 DIAGNOSIS — T148XXA Other injury of unspecified body region, initial encounter: Secondary | ICD-10-CM

## 2022-03-02 DIAGNOSIS — Z23 Encounter for immunization: Secondary | ICD-10-CM

## 2022-03-02 DIAGNOSIS — S81031A Puncture wound without foreign body, right knee, initial encounter: Secondary | ICD-10-CM

## 2022-03-02 MED ORDER — CEPHALEXIN 500 MG PO CAPS: 500.0000 mg | ORAL_CAPSULE | Freq: Four times a day (QID) | ORAL | 0 refills | Status: AC

## 2022-03-02 MED ORDER — TETANUS-DIPHTH-ACELL PERTUSSIS 5-2.5-18.5 LF-MCG/0.5 IM SUSY
0.5000 mL | PREFILLED_SYRINGE | Freq: Once | INTRAMUSCULAR | Status: AC
  Administered 2022-03-02: 0.5 mL via INTRAMUSCULAR

## 2022-03-02 NOTE — ED Triage Notes (Signed)
Pt presents to Urgent Care with c/o injury to R knee. States she was moving an object and was punctured by a "furniture tack." Wants a Tetanus shot.

## 2022-03-02 NOTE — Discharge Instructions (Addendum)
Your puncture wound is not actively infected.  However, given the depth of the puncture, I am concerned that 1 may develop.  I have prescribed you cephalexin to take 4 times daily for the next 5 days. Please continue to copiously wash the wound with soap and water, hydrogen peroxide, or Betadine over the next 24 hours. Continue using topical antibiotic ointment, preferably bacitracin. You were given a tetanus vaccination today.

## 2022-03-03 ENCOUNTER — Telehealth: Payer: Self-pay | Admitting: Emergency Medicine

## 2022-03-03 NOTE — ED Provider Notes (Signed)
[facility]    CSN: [id number] Arrival date & time: 03/02/22  1404      History   Chief Complaint Chief Complaint  Patient presents with   Puncture Wound    HPI Charlene Carey is a 46 y.o. female.   Pleasant 46yo female presents today with concerns of a puncture wound to her R medial knee. This occurred a few hours prior to presentation. She was moving an old picture out of a storage closet, and a tack stuck her in the knee. She states the tack was not rusty, but was likely dirty. It was estimated to have impaled her knee roughly 0.50-1 inch deep. She currently denies any pain in the knee, erythema, swelling, drainage, or decreased ROM. Presents primarily requesting a tetanus shot as last one was in 2011. Did clean wound with betadine prior to arrival.      Past Medical History:  Diagnosis Date   Allergy-induced asthma    Aortic stenosis with bicuspid valve    Ascending aortic aneurysm (HCC)    Frequent headaches    Heartburn    History of chicken pox    History of gestational diabetes    Hyperglycemia    Multinodular goiter    Seasonal allergies    Subclinical hyperthyroidism     Patient Active Problem List   Diagnosis Date Noted   Preventative health care 01/31/2021   Subclinical hyperthyroidism 12/21/2020   Hyperglycemia 12/21/2020   Irregular menses 12/20/2020   Paresthesia 12/20/2020   Vitamin D deficiency 12/20/2020   Carpal tunnel syndrome of left wrist 12/20/2020   Anxiety 12/20/2020   Ascending aortic aneurysm (HCC)    Multinodular goiter    Atypical chest pain 10/16/2016   Dyspnea on exertion 10/16/2016   Aortic stenosis due to bicuspid aortic valve     Past Surgical History:  Procedure Laterality Date   BREAST REDUCTION SURGERY     OTHER SURGICAL HISTORY     had d& c   RIGHT/LEFT HEART CATH AND CORONARY ANGIOGRAPHY N/A 11/18/2016   Procedure: Right/Left Heart Cath and Coronary Angiography;  Surgeon: Lorretta Harp, MD;   Location: [facility];  Service: Cardiovascular;  Laterality: N/A;   TEE WITHOUT CARDIOVERSION N/A 01/27/2017   Procedure: TRANSESOPHAGEAL ECHOCARDIOGRAM (TEE);  Surgeon: Sanda Klein, MD;  Location: [facility];  Service: Cardiovascular;  Laterality: N/A;    OB History   No obstetric history on file.      Home Medications    Prior to Admission medications   Medication Sig Start Date End Date Taking? Authorizing Provider  cephALEXin (KEFLEX) 500 MG capsule Take 1 capsule (500 mg total) by mouth 4 (four) times daily for 5 days. 03/02/22 03/07/22 Yes Mina Carlisi L, PA  Cyanocobalamin (VITAMIN B-12) 1000 MCG SUBL Take 1 tablet by mouth daily.    [provider]    Family History Family History  Problem Relation Age of Onset   Hypertension Mother    Thyroid disease Mother        Hypothyroid   Hyperlipidemia Mother    Cancer Father    Diabetes Father    CAD Father    Alcohol abuse Brother    Hypertension Maternal Grandfather    Stroke Maternal Grandfather    Hyperlipidemia Maternal Grandfather    Bipolar disorder Daughter     Social History Social History   Tobacco Use   Smoking status: Former   Smokeless tobacco: Never  Scientific laboratory technician Use: Never  used  Substance Use Topics   Alcohol use: Yes    Comment: rarely   Drug use: No     Allergies   Codeine   Review of Systems Review of Systems As per HPI  Physical Exam Triage Vital Signs ED Triage Vitals  Enc Vitals Group     BP 03/02/22 1427 130/81     Pulse Rate 03/02/22 1427 73     Resp 03/02/22 1427 16     Temp 03/02/22 1427 98.5 F (36.9 C)     Temp Source 03/02/22 1427 Oral     SpO2 03/02/22 1427 98 %     Weight 03/02/22 1426 167 lb (75.8 kg)     Height 03/02/22 1426 5' 6.5" (1.689 m)     Head Circumference --      Peak Flow --      Pain Score 03/02/22 1425 2     Pain Loc --      Pain Edu? --      Excl. in [place]? --    No data found.  Updated Vital Signs BP 130/81 (BP  Location: Right Arm)   Pulse 73   Temp 98.5 F (36.9 C) (Oral)   Resp 16   Ht 5' 6.5" (1.689 m)   Wt 167 lb (75.8 kg)   LMP  (LMP Unknown)   SpO2 98%   BMI 26.55 kg/m   Visual Acuity Right Eye Distance:   Left Eye Distance:   Bilateral Distance:    Right Eye Near:   Left Eye Near:    Bilateral Near:     Physical Exam Vitals and nursing note reviewed.  Constitutional:      General: She is not in acute distress.    Appearance: Normal appearance. She is not ill-appearing, toxic-appearing or diaphoretic.  HENT:     Head: Normocephalic.  Cardiovascular:     Rate and Rhythm: Normal rate.  Pulmonary:     Effort: Pulmonary effort is normal. No respiratory distress.  Musculoskeletal:        General: Signs of injury (single pinpoint puncture wound to R inferior medial knee without surrounding edema, warmth or erythema) present. No swelling or tenderness. Normal range of motion.  Skin:    General: Skin is warm and dry.     Capillary Refill: Capillary refill takes less than 2 seconds.     Coloration: Skin is not jaundiced.     Findings: No bruising, erythema or rash.  Neurological:     General: No focal deficit present.     Mental Status: She is alert and oriented to person, place, and time.     Sensory: No sensory deficit.     Motor: No weakness.     Gait: Gait normal.      UC Treatments / Results  Labs (all labs ordered are listed, but only abnormal results are displayed) Labs Reviewed - No data to display  EKG   Radiology No results found.  Procedures Procedures (including critical care time)  Medications Ordered in UC Medications  Tdap (BOOSTRIX) injection 0.5 mL (0.5 mLs Intramuscular Given 03/02/22 1505)    Initial Impression / Assessment and Plan / UC Course  I have reviewed the triage vital signs and the nursing notes.  Pertinent labs & imaging results that were available during my care of the patient were reviewed by me and considered in my medical  decision making (see chart for details).     Puncture wound of R knee - currently uncomplicated.  No active signs of infection, however given depth of puncture with contaminated metal tack, empiric abx recommended to prevent soft tissue infection. Encouraged pt to continue to cleanse the area with soap and water/ betadine preparations Tetanus vaccination - updated in office today.   Final Clinical Impressions(s) / UC Diagnoses   Final diagnoses:  Puncture wound  Need for prophylactic vaccination with combined diphtheria-tetanus-pertussis (DTP) vaccine     Discharge Instructions      Your puncture wound is not actively infected.  However, given the depth of the puncture, I am concerned that 1 may develop.  I have prescribed you cephalexin to take 4 times daily for the next 5 days. Please continue to copiously wash the wound with soap and water, hydrogen peroxide, or Betadine over the next 24 hours. Continue using topical antibiotic ointment, preferably bacitracin. You were given a tetanus vaccination today.   ED Prescriptions     Medication Sig Dispense Auth. Provider   cephALEXin (KEFLEX) 500 MG capsule Take 1 capsule (500 mg total) by mouth 4 (four) times daily for 5 days. 20 capsule Sabre Leonetti L, PA      PDMP not reviewed this encounter.   Chaney Malling, [place] 03/03/22 2241

## 2022-04-23 ENCOUNTER — Ambulatory Visit: Payer: No Typology Code available for payment source | Admitting: Dermatology

## 2022-04-23 DIAGNOSIS — L249 Irritant contact dermatitis, unspecified cause: Secondary | ICD-10-CM | POA: Diagnosis not present

## 2022-04-23 DIAGNOSIS — B078 Other viral warts: Secondary | ICD-10-CM | POA: Diagnosis not present

## 2022-04-23 MED ORDER — MOMETASONE FUROATE 0.1 % EX CREA: TOPICAL_CREAM | CUTANEOUS | 2 refills | Status: DC

## 2022-04-23 NOTE — Patient Instructions (Addendum)
Cryotherapy Aftercare  Wash gently with soap and water everyday.   Apply Vaseline and Band-Aid daily until healed.    Irritant Hand Dermatitis is a chronic type of eczema that can come and go on the hands and fingers.  While there is no cure, the rash and symptoms can be managed with topical prescription medications, and for more severe cases, with systemic medications.  Recommend mild soap and routine use of moisturizing cream after handwashing.  Minimize soap/water exposure when possible. (CeraVe Therapeutic Hand Cream)  Start mometasone cream Apply to affected areas hands once to twice daily until rash improved.  Topical steroids (such as triamcinolone, fluocinolone, fluocinonide, mometasone, clobetasol, halobetasol, betamethasone, hydrocortisone) can cause thinning and lightening of the skin if they are used for too long in the same area. Your physician has selected the right strength medicine for your problem and area affected on the body. Please use your medication only as directed by your physician to prevent side effects.    Due to recent changes in healthcare laws, you may see results of your pathology and/or laboratory studies on MyChart before the doctors have had a chance to review them. We understand that in some cases there may be results that are confusing or concerning to you. Please understand that not all results are received at the same time and often the doctors may need to interpret multiple results in order to provide you with the best plan of care or course of treatment. Therefore, we ask that you please give [place] 2 business days to thoroughly review all your results before contacting the office for clarification. Should we see a critical lab result, you will be contacted sooner.   If You Need Anything After Your Visit  If you have any questions or concerns for your doctor, please call our main line at [phone] and press option 4 to reach your doctor's medical assistant. If  no one answers, please leave a voicemail as directed and we will return your call as soon as possible. Messages left after 4 pm will be answered the following business day.   You may also send [place] a message via [place]. We typically respond to MyChart messages within 1-2 business days.  For prescription refills, please ask your pharmacy to contact our office. Our fax number is [phone].  If you have an urgent issue when the clinic is closed that cannot wait until the next business day, you can page your doctor at the number below.    Please note that while we do our best to be available for urgent issues outside of office hours, we are not available 24/7.   If you have an urgent issue and are unable to reach [place], you may choose to seek medical care at your doctor's office, retail clinic, urgent care center, or emergency room.  If you have a medical emergency, please immediately call 911 or go to the emergency department.  Pager Numbers  - Dr. Nehemiah Massed: [phone]  - Dr. Laurence Ferrari: [phone]  - Dr. Nicole Kindred: [phone]  In the event of inclement weather, please call our main line at [phone] for an update on the status of any delays or closures.  Dermatology Medication Tips: Please keep the boxes that topical medications come in in order to help keep track of the instructions about where and how to use these. Pharmacies typically print the medication instructions only on the boxes and not directly on the medication tubes.   If your medication is too expensive, please contact  our office at [phone] option 4 or send [place] a message through [place].   We are unable to tell what your co-pay for medications will be in advance as this is different depending on your insurance coverage. However, we may be able to find a substitute medication at lower cost or fill out paperwork to get insurance to cover a needed medication.   If a prior authorization is required to get your medication  covered by your insurance company, please allow [place] 1-2 business days to complete this process.  Drug prices often vary depending on where the prescription is filled and some pharmacies may offer cheaper prices.  The website www.goodrx.com contains coupons for medications through different pharmacies. The prices here do not account for what the cost may be with help from insurance (it may be cheaper with your insurance), but the website can give you the price if you did not use any insurance.  - You can print the associated coupon and take it with your prescription to the pharmacy.  - You may also stop by our office during regular business hours and pick up a GoodRx coupon card.  - If you need your prescription sent electronically to a different pharmacy, notify our office through [facility] or by phone at [phone] option 4.     Si Usted Necesita Algo Despus de Su Visita  Tambin puede enviarnos un mensaje a travs de Pharmacist, community. Por lo general respondemos a los mensajes de MyChart en el transcurso de 1 a 2 das hbiles.  Para renovar recetas, por favor pida a su farmacia que se ponga en contacto con nuestra oficina. Harland Dingwall de fax es [place] [phone].  Si tiene un asunto urgente cuando la clnica est cerrada y que no puede esperar hasta el siguiente da hbil, puede llamar/localizar a su doctor(a) al nmero que aparece a continuacin.   Por favor, tenga en cuenta que aunque hacemos todo lo posible para estar disponibles para asuntos urgentes fuera del horario de [place], no estamos disponibles las 24 horas del da, los 7 das de la [place].   Si tiene un problema urgente y no puede comunicarse con nosotros, puede optar por buscar atencin mdica  en el consultorio de su doctor(a), en una clnica privada, en un centro de atencin urgente o en una sala de emergencias.  Si tiene Engineering geologist, por favor llame inmediatamente al 911 o vaya a la sala de  emergencias.  Nmeros de bper  - Dr. Nehemiah Massed: [phone]  - Dra. Moye: [phone]  - Dra. Nicole Kindred: [phone]  En caso de inclemencias del [place], por favor llame a Johnsie Kindred principal al [phone] para una actualizacin sobre el [place] de cualquier retraso o cierre.  Consejos para la medicacin en dermatologa: Por favor, guarde las cajas en las que vienen los medicamentos de uso tpico para ayudarle a seguir las instrucciones sobre dnde y cmo usarlos. Las farmacias generalmente imprimen las instrucciones del medicamento slo en las cajas y no directamente en los tubos del [place].   Si su medicamento es muy caro, por favor, pngase en contacto con Zigmund Daniel llamando al [phone] y presione la opcin 4 o envenos un mensaje a travs de Pharmacist, community.   No podemos decirle cul ser su copago por los medicamentos por adelantado ya que esto es diferente dependiendo de la cobertura de su seguro. Sin embargo, es posible que podamos encontrar un medicamento sustituto a Electrical engineer un formulario para que el seguro cubra el medicamento  que se considera necesario.   Si se requiere una autorizacin previa para que su compaa de seguros [place] su medicamento, por favor permtanos de 1 a 2 das hbiles para completar este proceso.  Los precios de los medicamentos varan con frecuencia dependiendo del Environmental consultant de dnde se surte la receta y alguna farmacias pueden ofrecer precios ms baratos.  El sitio web www.goodrx.com tiene cupones para medicamentos de Airline pilot. Los precios aqu no tienen en cuenta lo que podra costar con la ayuda del seguro (puede ser ms barato con su seguro), pero el sitio web puede darle el precio si no utiliz Research scientist (physical sciences).  - Puede imprimir el cupn correspondiente y llevarlo con su receta a la farmacia.  - Tambin puede pasar por nuestra oficina durante el horario de atencin regular y Charity fundraiser una tarjeta de cupones de GoodRx.  - Si  necesita que su receta se enve electrnicamente a una farmacia diferente, informe a nuestra oficina a travs de MyChart de [place] o por telfono llamando al [phone] y presione la opcin 4.

## 2022-04-23 NOTE — Progress Notes (Signed)
   Follow-Up Visit   Subjective  Charlene Carey is a 46 y.o. female who presents for the following: bump (Right wrist, present for months, was red at first. Tender if hit.) and Itching (Hands are dry and scaly, sometimes bumps. Washes hands a lot. Uses CeraVe Lotion after handwashing. She has tried cortisone cream. ). No history of eczema. Patient has seasonal allergies.   The following portions of the chart were reviewed this encounter and updated as appropriate:       Review of Systems:  No other skin or systemic complaints except as noted in HPI or Assessment and Plan.  Objective  Well appearing patient in no apparent distress; mood and affect are within normal limits.  A focused examination was performed including face, hands, fingers. Relevant physical exam findings are noted in the Assessment and Plan.  right wrist Verrucous papules -- Discussed viral etiology and contagion.   bilateral hands Erythema with hyperlinear markings on the palms.    Assessment & Plan  Other viral warts right wrist  Viral Wart (HPV) Counseling  Discussed viral / HPV (Human Papilloma Virus) etiology and risk of spread /infectivity to other areas of body as well as to other people.  Multiple treatments and methods may be required to clear warts and it is possible treatment may not be successful.  Treatment risks include discoloration; scarring and there is still potential for wart recurrence.  Destruction of lesion - right wrist  Destruction method: cryotherapy   Informed consent: discussed and consent obtained   Lesion destroyed using liquid nitrogen: Yes   Region frozen until ice ball extended beyond lesion: Yes   Outcome: patient tolerated procedure well with no complications   Post-procedure details: wound care instructions given   Additional details:  Prior to procedure, discussed risks of blister formation, small wound, skin dyspigmentation, or rare scar following cryotherapy.  Recommend Vaseline ointment to treated areas while healing.   Irritant hand dermatitis bilateral hands  Likely due to frequent hand washing.   Hand Dermatitis is a chronic type of eczema that can come and go on the hands and fingers.  While there is no cure, the rash and symptoms can be managed with topical prescription medications, and for more severe cases, with systemic medications.  Recommend mild soap and routine use of moisturizing cream after handwashing.  Minimize soap/water exposure when possible.    Dove wash and Aveeno eczema samples given  Start mometasone cream Apply to AA hands QD/BID until improved dsp 45g 2Rf.  Topical steroids (such as triamcinolone, fluocinolone, fluocinonide, mometasone, clobetasol, halobetasol, betamethasone, hydrocortisone) can cause thinning and lightening of the skin if they are used for too long in the same area. Your physician has selected the right strength medicine for your problem and area affected on the body. Please use your medication only as directed by your physician to prevent side effects.    mometasone (ELOCON) 0.1 % cream - bilateral hands Apply to affected areas hands once to twice daily until rash improved.   Return if symptoms worsen or fail to improve.  IJamesetta Orleans, CMA, am acting as scribe for Brendolyn Patty, MD .  Documentation: I have reviewed the above documentation for accuracy and completeness, and I agree with the above.  Brendolyn Patty MD

## 2022-05-03 ENCOUNTER — Ambulatory Visit
Admission: EM | Admit: 2022-05-03 | Discharge: 2022-05-03 | Disposition: A | Discharge status: Home / Self Care | Payer: No Typology Code available for payment source | Attending: Family Medicine | Admitting: Family Medicine

## 2022-05-03 DIAGNOSIS — U071 COVID-19: Secondary | ICD-10-CM | POA: Insufficient documentation

## 2022-05-03 DIAGNOSIS — J45909 Unspecified asthma, uncomplicated: Secondary | ICD-10-CM | POA: Insufficient documentation

## 2022-05-03 DIAGNOSIS — J029 Acute pharyngitis, unspecified: Secondary | ICD-10-CM | POA: Insufficient documentation

## 2022-05-03 MED ORDER — PAXLOVID (300/100) 20 X 150 MG & 10 X 100MG PO TBPK: 6.0000 | ORAL_TABLET | Freq: Two times a day (BID) | ORAL | 0 refills | Status: AC

## 2022-05-03 NOTE — ED Provider Notes (Signed)
[facility]    CSN: [id number] Arrival date & time: 05/03/22  0932      History   Chief Complaint Chief Complaint  Patient presents with   Nasal Congestion    Nasal congestion, fatigue and sore throat. X3 days    HPI Charlene Carey is a 46 y.o. female.   Three days ago patient developed a headache, chills, fever, fatigue, mild nasal congestion, and myalgias.  Yesterday she developed a cough and last night a sore throat that is worse today.  Multiple relatives have had COVID, including her daughter who also had strep pharyngitis one week ago.  Patient had a positive home COVID test yesterday.  She is concerned that she may also have strep throat.  She has a history of allergy induced asthma but denies wheezing and shortness of breath.  The history is provided by the patient.    Past Medical History:  Diagnosis Date   Allergy-induced asthma    Aortic stenosis with bicuspid valve    Ascending aortic aneurysm (HCC)    Frequent headaches    Heartburn    History of chicken pox    History of gestational diabetes    Hyperglycemia    Multinodular goiter    Seasonal allergies    Subclinical hyperthyroidism     Patient Active Problem List   Diagnosis Date Noted   Preventative health care 01/31/2021   Subclinical hyperthyroidism 12/21/2020   Hyperglycemia 12/21/2020   Irregular menses 12/20/2020   Paresthesia 12/20/2020   Vitamin D deficiency 12/20/2020   Carpal tunnel syndrome of left wrist 12/20/2020   Anxiety 12/20/2020   Ascending aortic aneurysm (HCC)    Multinodular goiter    Atypical chest pain 10/16/2016   Dyspnea on exertion 10/16/2016   Aortic stenosis due to bicuspid aortic valve     Past Surgical History:  Procedure Laterality Date   BREAST REDUCTION SURGERY     OTHER SURGICAL HISTORY     had d& c   RIGHT/LEFT HEART CATH AND CORONARY ANGIOGRAPHY N/A 11/18/2016   Procedure: Right/Left Heart Cath and Coronary Angiography;  Surgeon: Lorretta Harp, MD;  Location: [place] [facility];  Service: Cardiovascular;  Laterality: N/A;   TEE WITHOUT CARDIOVERSION N/A 01/27/2017   Procedure: TRANSESOPHAGEAL ECHOCARDIOGRAM (TEE);  Surgeon: Sanda Klein, MD;  Location: [facility] ENDOSCOPY;  Service: Cardiovascular;  Laterality: N/A;    OB History   No obstetric history on file.      Home Medications    Prior to Admission medications   Medication Sig Start Date End Date Taking? Authorizing Provider  Cyanocobalamin (VITAMIN B-12) 1000 MCG SUBL Take 1 tablet by mouth daily.   Yes [provider]  mometasone (ELOCON) 0.1 % cream Apply to affected areas hands once to twice daily until rash improved. 04/23/22  Yes Brendolyn Patty, MD  nirmatrelvir & ritonavir (PAXLOVID, 300/100,) 20 x 150 MG & 10 x '100MG'$  TBPK Take 6 tablets by mouth 2 (two) times daily for 5 days. 05/03/22 05/08/22 Yes Kandra Nicolas, MD    Family History Family History  Problem Relation Age of Onset   Hypertension Mother    Thyroid disease Mother        Hypothyroid   Hyperlipidemia Mother    Cancer Father    Diabetes Father    CAD Father    Alcohol abuse Brother    Hypertension Maternal Grandfather    Stroke Maternal Grandfather    Hyperlipidemia Maternal Grandfather    Bipolar disorder  Daughter     Social History Social History   Tobacco Use   Smoking status: Former   Smokeless tobacco: Never  Scientific laboratory technician Use: Never used  Substance Use Topics   Alcohol use: Yes    Comment: rarely   Drug use: No     Allergies   Codeine   Review of Systems Review of Systems + sore throat + cough No pleuritic pain No wheezing + nasal congestion + post-nasal drainage No sinus pain/pressure No itchy/red eyes No earache No hemoptysis No SOB + fever, + chills No nausea No vomiting No abdominal pain No diarrhea No urinary symptoms No skin rash + fatigue + myalgias + headache   Physical Exam Triage Vital Signs ED Triage Vitals   Enc Vitals Group     BP 05/03/22 1130 115/82     Pulse Rate 05/03/22 1130 96     Resp 05/03/22 1130 18     Temp 05/03/22 1130 99.5 F (37.5 C)     Temp Source 05/03/22 1130 Oral     SpO2 05/03/22 1130 97 %     Weight 05/03/22 1128 170 lb (77.1 kg)     Height 05/03/22 1128 '5\' 6"'$  (1.676 m)     Head Circumference --      Peak Flow --      Pain Score 05/03/22 1128 7     Pain Loc --      Pain Edu? --      Excl. in [place]? --    No data found.  Updated Vital Signs BP 115/82 (BP Location: Left Arm)   Pulse 96   Temp 99.5 F (37.5 C) (Oral)   Resp 18   Ht '5\' 6"'$  (1.676 m)   Wt 77.1 kg   SpO2 97%   BMI 27.44 kg/m   Visual Acuity Right Eye Distance:   Left Eye Distance:   Bilateral Distance:    Right Eye Near:   Left Eye Near:    Bilateral Near:     Physical Exam Nursing notes and Vital Signs reviewed. Appearance:  Patient appears stated age, and in no acute distress Eyes:  Pupils are equal, round, and reactive to light and accomodation.  Extraocular movement is intact.  Conjunctivae are not inflamed  Ears:  Canals normal.  Tympanic membranes normal.  Nose:  Mildly congested turbinates.  No sinus tenderness.  Pharynx:   Erythematous Neck:  Supple.  Mildly enlarged lateral nodes are present, tender to palpation on the left.  Tonsillar nodes are mildly enlarged and tender to palpation. Lungs:  Clear to auscultation.  Breath sounds are equal.  Moving air well. Heart:  Regular rate and rhythm without murmurs, rubs, or gallops.  Abdomen:  Nontender without masses or hepatosplenomegaly.  Bowel sounds are present.  No CVA or flank tenderness.  Extremities:  No edema.  Skin:  No rash present.   UC Treatments / Results  Labs (all labs ordered are listed, but only abnormal results are displayed) Labs Reviewed  CULTURE, GROUP A STREP [facility])  POCT RAPID STREP A (OFFICE) negative      EKG   Radiology No results found.  Procedures Procedures (including critical care  time)  Medications Ordered in UC Medications - No data to display  Initial Impression / Assessment and Plan / UC Course  I have reviewed the triage vital signs and the nursing notes.  Pertinent labs & imaging results that were available during my care of the patient were reviewed by  me and considered in my medical decision making (see chart for details).    Patient has multiple risk factors. Begin Paxlovid (review of records reveals normal renal function).  Will also check throat culture.  Followup with Family Doctor if not improved in one week.   Final Clinical Impressions(s) / UC Diagnoses   Final diagnoses:  [id number] virus infection  Acute sore throat     Discharge Instructions      Increase fluid intake.  Get adequate rest.   May take acetaminophen for sore throat. Try warm salt water gargles for sore throat.  Stop all antihistamines for now, and other non-prescription cough/cold preparations.  If symptoms become significantly worse during the night or over the weekend, proceed to the local emergency room.   Your COVID-19 test is positive.  Isolate yourself for five days from today.  At the end of five days you may end isolation if your symptoms have cleared or improved, and you have not had a fever for 24 hours. At this time you should wear a mask for five more days when you are around others.            ED Prescriptions     Medication Sig Dispense Auth. Provider   nirmatrelvir & ritonavir (PAXLOVID, 300/100,) 20 x 150 MG & 10 x '100MG'$  TBPK Take 6 tablets by mouth 2 (two) times daily for 5 days. 60 tablet Kandra Nicolas, MD         Kandra Nicolas, MD 05/05/22 [phone]

## 2022-05-03 NOTE — Discharge Instructions (Signed)
Increase fluid intake.  Get adequate rest.   May take acetaminophen for sore throat. Try warm salt water gargles for sore throat.  Stop all antihistamines for now, and other non-prescription cough/cold preparations.  If symptoms become significantly worse during the night or over the weekend, proceed to the local emergency room.   Your COVID-19 test is positive.  Isolate yourself for five days from today.  At the end of five days you may end isolation if your symptoms have cleared or improved, and you have not had a fever for 24 hours. At this time you should wear a mask for five more days when you are around others.

## 2022-05-03 NOTE — ED Triage Notes (Signed)
Pt states that she has some nasal congestion, sore throat and fatigue. X3 days  Pt states that she tested positive for covid on 11/23.

## 2022-05-06 LAB — CULTURE, GROUP A STREP (THRC)

## 2022-08-02 ENCOUNTER — Ambulatory Visit: Payer: No Typology Code available for payment source | Admitting: Family

## 2022-08-02 ENCOUNTER — Encounter: Payer: Self-pay | Admitting: Emergency Medicine

## 2022-08-02 ENCOUNTER — Ambulatory Visit
Admission: EM | Admit: 2022-08-02 | Discharge: 2022-08-02 | Disposition: A | Discharge status: Home / Self Care | Payer: No Typology Code available for payment source | Attending: Urgent Care | Admitting: Urgent Care

## 2022-08-02 DIAGNOSIS — R110 Nausea: Secondary | ICD-10-CM | POA: Insufficient documentation

## 2022-08-02 DIAGNOSIS — R109 Unspecified abdominal pain: Secondary | ICD-10-CM | POA: Diagnosis not present

## 2022-08-02 LAB — POCT URINALYSIS DIP (MANUAL ENTRY)
Bilirubin, UA: NEGATIVE
Blood, UA: NEGATIVE
Glucose, UA: NEGATIVE mg/dL
Ketones, POC UA: NEGATIVE mg/dL
Leukocytes, UA: NEGATIVE
Nitrite, UA: NEGATIVE
Protein Ur, POC: NEGATIVE mg/dL
Spec Grav, UA: 1.01 (ref 1.010–1.025)
Urobilinogen, UA: 0.2 E.U./dL
pH, UA: 6 (ref 5.0–8.0)

## 2022-08-02 MED ORDER — ACETAMINOPHEN 325 MG PO TABS: 650.0000 mg | ORAL_TABLET | Freq: Four times a day (QID) | ORAL | 0 refills | Status: DC | PRN

## 2022-08-02 MED ORDER — TIZANIDINE HCL 4 MG PO TABS: 4.0000 mg | ORAL_TABLET | Freq: Every day | ORAL | 0 refills | Status: DC

## 2022-08-02 NOTE — ED Provider Notes (Signed)
[place]  Note:  This document was prepared using Dragon voice recognition software and may include unintentional dictation errors.  MRN: [id number] DOB: Sep 07, 1975  Subjective:   Charlene Carey is a 47 y.o. female presenting for 2-week history of persistent left-sided flank pain, internal left-sided thoracic pain.  Reports that she feels like she is bruised internally.  Has had occasional nausea without vomiting.  No fever, cough, chest pain, shortness of breath, diarrhea, constipation, bloody stools, hematuria, dysuria.  No history of kidney stones, pyelonephritis.  Patient tries to hydrate well.  Reports that she has had back pains in the past but this is different for her.  No current facility-administered medications for this encounter.  Current Outpatient Medications:    Cyanocobalamin (VITAMIN B-12) 1000 MCG SUBL, Take 1 tablet by mouth daily., Disp: , Rfl:    mometasone (ELOCON) 0.1 % cream, Apply to affected areas hands once to twice daily until rash improved., Disp: 45 g, Rfl: 2   Allergies  Allergen Reactions   Codeine Nausea Only    Has withdrawal when trying to stop med    Past Medical History:  Diagnosis Date   Allergy-induced asthma    Aortic stenosis with bicuspid valve    Ascending aortic aneurysm (HCC)    Frequent headaches    Heartburn    History of chicken pox    History of gestational diabetes    Hyperglycemia    Multinodular goiter    Seasonal allergies    Subclinical hyperthyroidism      Past Surgical History:  Procedure Laterality Date   BREAST REDUCTION SURGERY     OTHER SURGICAL HISTORY     had d& c   RIGHT/LEFT HEART CATH AND CORONARY ANGIOGRAPHY N/A 11/18/2016   Procedure: Right/Left Heart Cath and Coronary Angiography;  Surgeon: Lorretta Harp, MD;  Location: [place] [facility];  Service: Cardiovascular;  Laterality: N/A;   TEE WITHOUT CARDIOVERSION N/A 01/27/2017   Procedure: TRANSESOPHAGEAL ECHOCARDIOGRAM (TEE);   Surgeon: Sanda Klein, MD;  Location: [place] [facility];  Service: Cardiovascular;  Laterality: N/A;    Family History  Problem Relation Age of Onset   Hypertension Mother    Thyroid disease Mother        Hypothyroid   Hyperlipidemia Mother    Cancer Father    Diabetes Father    CAD Father    Alcohol abuse Brother    Hypertension Maternal Grandfather    Stroke Maternal Grandfather    Hyperlipidemia Maternal Grandfather    Bipolar disorder Daughter     Social History   Tobacco Use   Smoking status: Former   Smokeless tobacco: Never  Scientific laboratory technician Use: Never used  Substance Use Topics   Alcohol use: Yes    Comment: rarely   Drug use: No    ROS   Objective:   Vitals: BP (!) 129/90 (BP Location: Right Arm)   Pulse 76   Temp 98.4 F (36.9 C) (Oral)   Resp 18   Ht '5\' 6"'$  (1.676 m)   Wt 170 lb (77.1 kg)   SpO2 99%   BMI 27.44 kg/m   Physical Exam Constitutional:      General: She is not in acute distress.    Appearance: Normal appearance. She is well-developed. She is not ill-appearing, toxic-appearing or diaphoretic.  HENT:     Head: Normocephalic and atraumatic.     Nose: Nose normal.     Mouth/Throat:     Mouth:  Mucous membranes are moist.  Eyes:     General: No scleral icterus.       Right eye: No discharge.        Left eye: No discharge.     Extraocular Movements: Extraocular movements intact.     Conjunctiva/sclera: Conjunctivae normal.  Cardiovascular:     Rate and Rhythm: Normal rate.  Pulmonary:     Effort: Pulmonary effort is normal.  Abdominal:     General: Bowel sounds are normal. There is no distension.     Palpations: Abdomen is soft. There is no mass.     Tenderness: There is no abdominal tenderness. There is no right CVA tenderness, left CVA tenderness, guarding or rebound.  Musculoskeletal:     Thoracic back: No swelling, edema, deformity, signs of trauma, lacerations, spasms, tenderness or bony tenderness. Normal range of motion.  No scoliosis.     Lumbar back: No swelling, edema, deformity, signs of trauma, lacerations, spasms, tenderness or bony tenderness. Normal range of motion. Negative right straight leg raise test and negative left straight leg raise test. No scoliosis.       Back:  Skin:    General: Skin is warm and dry.  Neurological:     General: No focal deficit present.     Mental Status: She is alert and oriented to person, place, and time.  Psychiatric:        Mood and Affect: Mood normal.        Behavior: Behavior normal.        Thought Content: Thought content normal.        Judgment: Judgment normal.    Results for orders placed or performed during the hospital encounter of 08/02/22 (from the past 24 hour(s))  POCT urinalysis dipstick     Status: None   Collection Time: 08/02/22  9:59 AM  Result Value Ref Range   Color, UA yellow yellow   Clarity, UA clear clear   Glucose, UA negative negative mg/dL   Bilirubin, UA negative negative   Ketones, POC UA negative negative mg/dL   Spec Grav, UA 1.010 1.010 - 1.025   Blood, UA negative negative   pH, UA 6.0 5.0 - 8.0   Protein Ur, POC negative negative mg/dL   Urobilinogen, UA 0.2 0.2 or 1.0 E.U./dL   Nitrite, UA Negative Negative   Leukocytes, UA Negative Negative    Assessment and Plan :   PDMP not reviewed this encounter.  1. Left flank pain     Offered chest x-ray to rule out an atypical pneumonia but patient declined for now.  She was agreeable to pursue blood work.  Urinalysis clear.  Recommended conservative management with Tylenol and the muscle relaxant.  Patient plans to follow-up closely with her PCP. Counseled patient on potential for adverse effects with medications prescribed/recommended today, ER and return-to-clinic precautions discussed, patient verbalized understanding.    Jaynee Eagles, PA-C 08/02/22 1037

## 2022-08-02 NOTE — ED Triage Notes (Signed)
Patient c/o left sided back pain, left sided x 2 weeks.  No apparent injury.  Feels bruised.  Patient has taken Advil for the pain.  The pain comes and goes achy feeling.

## 2022-08-02 NOTE — Discharge Instructions (Addendum)
Do not use any nonsteroidal anti-inflammatories (NSAIDs) like ibuprofen, Motrin, naproxen, Aleve, etc. which are all available over-the-counter.  Please just use Tylenol at a dose of '650mg'$  once every 6 hours as needed for your aches, pains, fevers.  Can use this with tizanidine as a muscle relaxant. We will notify you of blood test results through mychart. If you see critical levels or alerts, call the clinic and we will review with you. Otherwise, follow up with your PCP.

## 2022-08-03 LAB — URINE CULTURE: Culture: <10000 — AB

## 2022-08-03 LAB — CBC WITH DIFFERENTIAL/PLATELET
Basophils Absolute: 0.1 10*3/uL (ref 0.0–0.2)
Basos: 1 %
EOS (ABSOLUTE): 0 10*3/uL (ref 0.0–0.4)
Eos: 0 %
Hematocrit: 45.7 % (ref 34.0–46.6)
Hemoglobin: 15.2 g/dL (ref 11.1–15.9)
Immature Grans (Abs): 0 10*3/uL (ref 0.0–0.1)
Immature Granulocytes: 1 %
Lymphocytes Absolute: 1.7 10*3/uL (ref 0.7–3.1)
Lymphs: 33 %
MCH: 29.6 pg (ref 26.6–33.0)
MCHC: 33.3 g/dL (ref 31.5–35.7)
MCV: 89 fL (ref 79–97)
Monocytes Absolute: 0.5 10*3/uL (ref 0.1–0.9)
Monocytes: 9 %
Neutrophils Absolute: 2.9 10*3/uL (ref 1.4–7.0)
Neutrophils: 56 %
Platelets: 252 10*3/uL (ref 150–450)
RBC: 5.14 x10E6/uL (ref 3.77–5.28)
RDW: 12.5 % (ref 11.7–15.4)
WBC: 5.3 10*3/uL (ref 3.4–10.8)

## 2022-08-03 LAB — BASIC METABOLIC PANEL
BUN/Creatinine Ratio: 15 (ref 9–23)
BUN: 12 mg/dL (ref 6–24)
CO2: 24 mmol/L (ref 20–29)
Calcium: 10 mg/dL (ref 8.7–10.2)
Chloride: 102 mmol/L (ref 96–106)
Creatinine, Ser: 0.79 mg/dL (ref 0.57–1.00)
Glucose: 94 mg/dL (ref 70–99)
Potassium: 4.5 mmol/L (ref 3.5–5.2)
Sodium: 140 mmol/L (ref 134–144)
eGFR: 92 mL/min/{1.73_m2} (ref >59)

## 2022-10-07 ENCOUNTER — Ambulatory Visit: Payer: No Typology Code available for payment source | Admitting: Family

## 2022-10-07 VITALS — BP 130/92 | HR 85 | Temp 98.0°F | Resp 16 | Wt 171.0 lb

## 2022-10-07 DIAGNOSIS — M5410 Radiculopathy, site unspecified: Secondary | ICD-10-CM

## 2022-10-07 DIAGNOSIS — R202 Paresthesia of skin: Secondary | ICD-10-CM | POA: Diagnosis not present

## 2022-10-07 DIAGNOSIS — R030 Elevated blood-pressure reading, without diagnosis of hypertension: Secondary | ICD-10-CM | POA: Diagnosis not present

## 2022-10-07 DIAGNOSIS — R531 Weakness: Secondary | ICD-10-CM | POA: Diagnosis not present

## 2022-10-07 LAB — COMPREHENSIVE METABOLIC PANEL
ALT: 18 U/L (ref 0–35)
AST: 18 U/L (ref 0–37)
Albumin: 4.6 g/dL (ref 3.5–5.2)
Alkaline Phosphatase: 54 U/L (ref 39–117)
BUN: 11 mg/dL (ref 6–23)
CO2: 29 mEq/L (ref 19–32)
Calcium: 10 mg/dL (ref 8.4–10.5)
Chloride: 103 mEq/L (ref 96–112)
Creatinine, Ser: 0.85 mg/dL (ref 0.40–1.20)
GFR: 80.57 mL/min (ref >60.00)
Glucose, Bld: 98 mg/dL (ref 70–99)
Potassium: 4.5 mEq/L (ref 3.5–5.1)
Sodium: 139 mEq/L (ref 135–145)
Total Bilirubin: 0.4 mg/dL (ref 0.2–1.2)
Total Protein: 7.2 g/dL (ref 6.0–8.3)

## 2022-10-07 LAB — B12 AND FOLATE PANEL
Folate: 10.1 ng/mL (ref >5.9)
Vitamin B-12: 780 pg/mL (ref 211–911)

## 2022-10-07 LAB — CBC WITH DIFFERENTIAL/PLATELET
Basophils Absolute: 0.1 10*3/uL (ref 0.0–0.1)
Basophils Relative: 2.1 % (ref 0.0–3.0)
Eosinophils Absolute: 0 10*3/uL (ref 0.0–0.7)
Eosinophils Relative: 0.6 % (ref 0.0–5.0)
HCT: 42.4 % (ref 36.0–46.0)
Hemoglobin: 14.1 g/dL (ref 12.0–15.0)
Lymphocytes Relative: 31.6 % (ref 12.0–46.0)
Lymphs Abs: 1.8 10*3/uL (ref 0.7–4.0)
MCHC: 33.3 g/dL (ref 30.0–36.0)
MCV: 89.8 fl (ref 78.0–100.0)
Monocytes Absolute: 0.4 10*3/uL (ref 0.1–1.0)
Monocytes Relative: 7.3 % (ref 3.0–12.0)
Neutro Abs: 3.3 10*3/uL (ref 1.4–7.7)
Neutrophils Relative %: 58.4 % (ref 43.0–77.0)
Platelets: 200 10*3/uL (ref 150.0–400.0)
RBC: 4.72 Mil/uL (ref 3.87–5.11)
RDW: 12.5 % (ref 11.5–15.5)
WBC: 5.6 10*3/uL (ref 4.0–10.5)

## 2022-10-07 LAB — SEDIMENTATION RATE: Sed Rate: 3 mm/hr (ref 0–20)

## 2022-10-07 LAB — TSH: TSH: 0.27 u[IU]/mL — ABNORMAL LOW (ref 0.35–5.50)

## 2022-10-07 MED ORDER — MELOXICAM 7.5 MG PO TABS: 7.5000 mg | ORAL_TABLET | Freq: Every day | ORAL | 0 refills | Status: DC

## 2022-10-07 NOTE — Assessment & Plan Note (Signed)
Will obtain labs as below for further evaluation. I think stress could also be a contributing factor.

## 2022-10-07 NOTE — Assessment & Plan Note (Signed)
Initial bp 157/91, follow up bp 130/92.  Will plan to repeat in 1 month. If still above goal plan to initiate antihypertensive medication.

## 2022-10-07 NOTE — Progress Notes (Signed)
Subjective:   By signing my name below, I, [name], attest that this documentation has been prepared under the direction and in the presence of Sandford Craze, NP. 10/07/2022   Patient ID: Charlene Carey, female    DOB: 11-Apr-1976, 47 y.o.   MRN: [id number]  Chief Complaint  Patient presents with   Numbness    Complains of tingling and numbness on left side of back    HPI Patient is in today for a office visit.   Numbness and Tingling: She complains of numbness and tingling in her left left back. She also has pain in her lower left back. She also complains of weakness in her right leg and right arm. She noticed entire left side feels different this morning. She seen an urgent care 2 months ago for left flank pain and was treated for that. Her current pain is a different sensation than 2 months ago.  She has seen a neurologist in the past. She has joint pain in her hands and knees occasionally after waking up, otherwise she has no new joint pains.   Itching: She also complains of itching on her left arm. She thinks her tingling may be contributing to her symptoms.   Nausea: She also complains of occasional nausea while watching TV at night.  Blood pressure: Her blood pressure is elevated during this visit.  BP Readings from Last 3 Encounters:  10/07/22 (!) 157/91  08/02/22 (!) 129/90  05/03/22 115/82   Pulse Readings from Last 3 Encounters:  10/07/22 85  08/02/22 76  05/03/22 96   Veins: She reports veins her her lower legs are enlarged and more noticeable. Her mother has a history of verucose veins.  Mood: She has no new concerns with her mood.    Past Medical History:  Diagnosis Date   Allergy-induced asthma    Aortic stenosis with bicuspid valve    Ascending aortic aneurysm (HCC)    Frequent headaches    Heartburn    History of chicken pox    History of gestational diabetes    Hyperglycemia    Multinodular goiter    Seasonal allergies    Subclinical  hyperthyroidism     Past Surgical History:  Procedure Laterality Date   BREAST REDUCTION SURGERY     OTHER SURGICAL HISTORY     had d& c   RIGHT/LEFT HEART CATH AND CORONARY ANGIOGRAPHY N/A 11/18/2016   Procedure: Right/Left Heart Cath and Coronary Angiography;  Surgeon: Runell Gess, MD;  Location: [facility] INV[facility]IVE [facility];  Service: Cardiovascular;  Laterality: N/A;   TEE WITHOUT CARDIOVERSION N/A 01/27/2017   Procedure: TRANSESOPHAGEAL ECHOCARDIOGRAM (TEE);  Surgeon: Thurmon Fair, MD;  Location: [facility] ENDOSCOPY;  Service: Cardiovascular;  Laterality: N/A;    Family History  Problem Relation Age of Onset   Hypertension Mother    Thyroid disease Mother        Hypothyroid   Hyperlipidemia Mother    Cancer Father    Diabetes Father    CAD Father    Alcohol abuse Brother    Hypertension Maternal Grandfather    Stroke Maternal Grandfather    Hyperlipidemia Maternal Grandfather    Bipolar disorder Daughter     Social History   Socioeconomic History   Marital status: Married    Spouse name: Not on file   Number of children: Not on file   Years of education: Not on file   Highest education level: Not on file  Occupational History   Not  on file  Tobacco Use   Smoking status: Former   Smokeless tobacco: Never  Vaping Use   Vaping Use: Never used  Substance and Sexual Activity   Alcohol use: Yes    Comment: rarely   Drug use: No   Sexual activity: Yes    Partners: Male  Other Topics Concern   Not on file  Social History Narrative   Homeschools    [name]- born in 1997   [name]-1999   [name]- 2011 (from second marriage)- homeschools   She is a stay at home mom   No pets   Completed 2 years of college    married (second marriage)   Enjoys Tree surgeon, gardening, herb garden   Daughter and grandson live with her   Social Determinants of Corporate investment banker Strain: Not on file  Food Insecurity: Not on file  Transportation Needs: Not on file  Physical Activity:  Not on file  Stress: Not on file  Social Connections: Not on file  Intimate Partner Violence: Not on file    Outpatient Medications Prior to Visit  Medication Sig Dispense Refill   Cyanocobalamin (VITAMIN B-12) 1000 MCG SUBL Take 1 tablet by mouth daily.     acetaminophen (TYLENOL) 325 MG tablet Take 2 tablets (650 mg total) by mouth every 6 (six) hours as needed for moderate pain. 30 tablet 0   mometasone (ELOCON) 0.1 % cream Apply to affected areas hands once to twice daily until rash improved. 45 g 2   tiZANidine (ZANAFLEX) 4 MG tablet Take 1 tablet (4 mg total) by mouth at bedtime. 30 tablet 0   No facility-administered medications prior to visit.    Allergies  Allergen Reactions   Codeine Nausea Only    Has withdrawal when trying to stop med    Review of Systems  Gastrointestinal:  Positive for nausea (occasional).  Musculoskeletal:  Positive for back pain (lower left back pain).  Neurological:  Positive for tingling (lower left back) and weakness (right arm and right leg).       Objective:    Physical Exam Constitutional:      General: She is not in acute distress.    Appearance: Normal appearance. She is not ill-appearing.  HENT:     Head: Normocephalic and atraumatic.     Right Ear: External ear normal.     Left Ear: External ear normal.  Eyes:     Extraocular Movements: Extraocular movements intact.     Right eye: No nystagmus.     Left eye: No nystagmus.     Pupils: Pupils are equal, round, and reactive to light.  Cardiovascular:     Rate and Rhythm: Normal rate and regular rhythm.     Heart sounds: Normal heart sounds. No murmur heard.    No gallop.     Comments: Blood pressure measured 130/92 during manual recheck Pulmonary:     Effort: Pulmonary effort is normal. No respiratory distress.     Breath sounds: Normal breath sounds. No wheezing or rales.  Skin:    General: Skin is warm and dry.  Neurological:     Mental Status: She is alert and oriented  to person, place, and time.     Deep Tendon Reflexes:     Reflex Scores:      Bicep reflexes are 3+ on the right side and 3+ on the left side.      Brachioradialis reflexes are 3+ on the right side and 3+ on the left  side.      Patellar reflexes are 3+ on the right side and 3+ on the left side. Psychiatric:        Judgment: Judgment normal.     BP (!) 157/91 (BP Location: Left Arm, Patient Position: Sitting, Cuff Size: Small)   Pulse 85   Temp 98 F (36.7 C) (Oral)   Resp 16   Wt 171 lb (77.6 kg)   SpO2 99%   BMI 27.60 kg/m  Wt Readings from Last 3 Encounters:  10/07/22 171 lb (77.6 kg)  08/02/22 170 lb (77.1 kg)  05/03/22 170 lb (77.1 kg)       Assessment & Plan:  There are no diagnoses linked to this encounter.  I, Shehryar Tyson Alias, personally preformed the services described in this documentation.  All medical record entries made by the scribe were at my direction and in my presence.  I have reviewed the chart and discharge instructions (if applicable) and agree that the record reflects my personal performance and is accurate and complete. 10/07/2022   I,[name],acting as a Neurosurgeon for Lemont Fillers, NP.,have documented all relevant documentation on the behalf of Lemont Fillers, NP,as directed by  Lemont Fillers, NP while in the presence of Lemont Fillers, NP.   Shehryar [employer]

## 2022-10-07 NOTE — Assessment & Plan Note (Signed)
It sounds like she has had some cervical (left) and lumbar (left) radicular pain.

## 2022-10-08 ENCOUNTER — Other Ambulatory Visit (INDEPENDENT_AMBULATORY_CARE_PROVIDER_SITE_OTHER): Payer: No Typology Code available for payment source

## 2022-10-08 ENCOUNTER — Other Ambulatory Visit: Payer: Self-pay | Admitting: *Deleted

## 2022-10-08 DIAGNOSIS — E0590 Thyrotoxicosis, unspecified without thyrotoxic crisis or storm: Secondary | ICD-10-CM | POA: Diagnosis not present

## 2022-10-08 LAB — T4, FREE: Free T4: 1.32 ng/dL (ref 0.60–1.60)

## 2022-10-08 LAB — T3, FREE: T3, Free: 3.2 pg/mL (ref 2.3–4.2)

## 2022-10-09 LAB — ANTI-NUCLEAR AB-TITER (ANA TITER): ANA Titer 1: 1:40 {titer} — ABNORMAL HIGH

## 2022-10-09 LAB — RHEUMATOID FACTOR: Rheumatoid fact SerPl-aCnc: <10 IU/mL (ref <14)

## 2022-10-09 LAB — ANA: Anti Nuclear Antibody (ANA): POSITIVE — AB

## 2022-10-10 ENCOUNTER — Telehealth: Payer: Self-pay | Admitting: Family

## 2022-10-10 DIAGNOSIS — R768 Other specified abnormal immunological findings in serum: Secondary | ICD-10-CM

## 2022-10-10 NOTE — Telephone Encounter (Signed)
Also, her thyroid remains borderline overactive. I would recommend that she schedule a follow up visit with Dr. Lucianne Muss.

## 2022-10-10 NOTE — Telephone Encounter (Signed)
Pt saw her lab results and saw some abnormal results and wanted to let Melissa know that if she needed to come in to repeat labs she can do it tomorrow as next week is a little crazier. Please call her to advise of results and next steps.

## 2022-10-10 NOTE — Telephone Encounter (Signed)
Reviewed lab work.  ANA (one of the autoimmune tests is mildly positive).  This finding does not always mean that she has autoimmune disease.  I recommend that she meet with rheumatology since she has had fatigue for further evaluation.

## 2022-10-11 NOTE — Telephone Encounter (Signed)
Patient advised of results and referral. Also to contact Dr. Lucianne Muss for follow up

## 2022-10-11 NOTE — Telephone Encounter (Signed)
Opened in error

## 2022-10-14 NOTE — Telephone Encounter (Signed)
This was taken care of last week, documented at another phone note

## 2022-11-06 ENCOUNTER — Ambulatory Visit: Payer: No Typology Code available for payment source | Admitting: Family

## 2022-11-06 VITALS — BP 127/76 | HR 80 | Temp 98.0°F | Resp 16 | Wt 170.0 lb

## 2022-11-06 DIAGNOSIS — E559 Vitamin D deficiency, unspecified: Secondary | ICD-10-CM | POA: Diagnosis not present

## 2022-11-06 DIAGNOSIS — R531 Weakness: Secondary | ICD-10-CM

## 2022-11-06 DIAGNOSIS — R030 Elevated blood-pressure reading, without diagnosis of hypertension: Secondary | ICD-10-CM | POA: Diagnosis not present

## 2022-11-06 DIAGNOSIS — Z0001 Encounter for general adult medical examination with abnormal findings: Secondary | ICD-10-CM | POA: Diagnosis not present

## 2022-11-06 DIAGNOSIS — Z0000 Encounter for general adult medical examination without abnormal findings: Secondary | ICD-10-CM

## 2022-11-06 DIAGNOSIS — R350 Frequency of micturition: Secondary | ICD-10-CM

## 2022-11-06 DIAGNOSIS — R768 Other specified abnormal immunological findings in serum: Secondary | ICD-10-CM | POA: Insufficient documentation

## 2022-11-06 DIAGNOSIS — M5416 Radiculopathy, lumbar region: Secondary | ICD-10-CM | POA: Insufficient documentation

## 2022-11-06 DIAGNOSIS — Z1159 Encounter for screening for other viral diseases: Secondary | ICD-10-CM

## 2022-11-06 LAB — URINALYSIS, ROUTINE W REFLEX MICROSCOPIC
Bilirubin Urine: NEGATIVE
Hgb urine dipstick: NEGATIVE
Ketones, ur: NEGATIVE
Leukocytes,Ua: NEGATIVE
Nitrite: NEGATIVE
RBC / HPF: NONE SEEN (ref 0–?)
Specific Gravity, Urine: <=1.005 — AB (ref 1.000–1.030)
Total Protein, Urine: NEGATIVE
Urine Glucose: NEGATIVE
Urobilinogen, UA: 0.2 (ref 0.0–1.0)
WBC, UA: NONE SEEN (ref 0–?)
pH: 6 (ref 5.0–8.0)

## 2022-11-06 LAB — LIPID PANEL
Cholesterol: 223 mg/dL — ABNORMAL HIGH (ref 0–200)
HDL: 57 mg/dL (ref >39.00)
LDL Cholesterol: 143 mg/dL — ABNORMAL HIGH (ref 0–99)
NonHDL: 166.19
Total CHOL/HDL Ratio: 4
Triglycerides: 117 mg/dL (ref 0.0–149.0)
VLDL: 23.4 mg/dL (ref 0.0–40.0)

## 2022-11-06 LAB — VITAMIN D 25 HYDROXY (VIT D DEFICIENCY, FRACTURES): VITD: 47.73 ng/mL (ref 30.00–100.00)

## 2022-11-06 NOTE — Assessment & Plan Note (Signed)
Unchanged.  She was advised by ortho not to take steroids prior to visit with rheumatology. She does have meloxicam on hand and has not tried it. I encouraged her to try the meloxicam.  She has also been referred to physical therapy for back pain by orthopedics. I encouraged her to follow through with that.

## 2022-11-06 NOTE — Assessment & Plan Note (Signed)
BP Readings from Last 3 Encounters:  11/06/22 127/76  10/07/22 (!) 130/92  08/02/22 (!) 129/90   BP reading is improved today will continue to monitor.

## 2022-11-06 NOTE — Assessment & Plan Note (Signed)
Discussed healthy diet, exercise and weight loss.  Mammogram is scheduled. Recommended covid booster and flu shot this fall. Sees gyn for pap and pap is up to date

## 2022-11-06 NOTE — Assessment & Plan Note (Signed)
Overall seems improved.  I suspect that stress was playing a role in how she was feeling.

## 2022-11-06 NOTE — Progress Notes (Signed)
Subjective:     Patient ID: Charlene Carey, female    DOB: 09/30/1975, 47 y.o.   MRN: [id number]  No chief complaint on file.   HPI Patient is in today for follow up. Last visit she had multiple concerns:  Generalized weakness- Lab work was unremarkable with the exception of mildly elevated ANA titer. She is scheduled to meet with Rheumatology in July.   Lumbar radiculopathy- meloxicam was given. She saw orthopedics who offered a steroid.  She has not taken anything yet. She has an appointment for PT.    Elevated blood pressure reading-  BP Readings from Last 3 Encounters:  11/06/22 127/76  10/07/22 (!) 130/92  08/02/22 (!) 129/90   Paresthesias- she saw neurology (Dr. Antonietta Barcelona).  He believes that migraine variant is a possible cause.  Immunizations: Tetanus up to date Diet: trying to eat healthy Wt Readings from Last 3 Encounters:  11/06/22 170 lb (77.1 kg)  10/07/22 171 lb (77.6 kg)  08/02/22 170 lb (77.1 kg)  Exercise:  not much, just walks the dog Colonoscopy: 11/27/20 Pap Smear:09/25/20 Mammogram: She is now at [facility] [place] (mammogram will be done at [facility]) Vision:  up to date Dental:  up to date   Health Maintenance Due  Topic Date Due   HIV Screening  Never done   Hepatitis C Screening  Never done   COVID-19 Vaccine (2 - 2023-24 season) 02/08/2022    Past Medical History:  Diagnosis Date   Allergy-induced asthma    Aortic stenosis with bicuspid valve    Ascending aortic aneurysm (HCC)    Frequent headaches    Heartburn    History of chicken pox    History of gestational diabetes    Hyperglycemia    Multinodular goiter    Seasonal allergies    Subclinical hyperthyroidism     Past Surgical History:  Procedure Laterality Date   BREAST REDUCTION SURGERY     OTHER SURGICAL HISTORY     had d& c   RIGHT/LEFT HEART CATH AND CORONARY ANGIOGRAPHY N/A 11/18/2016   Procedure: Right/Left Heart Cath and Coronary Angiography;  Surgeon: Runell Gess, MD;  Location: [facility] INVASIVE CV LAB;  Service: Cardiovascular;  Laterality: N/A;   TEE WITHOUT CARDIOVERSION N/A 01/27/2017   Procedure: TRANSESOPHAGEAL ECHOCARDIOGRAM (TEE);  Surgeon: Thurmon Fair, MD;  Location: [facility] ENDOSCOPY;  Service: Cardiovascular;  Laterality: N/A;    Family History  Problem Relation Age of Onset   Hypertension Mother    Thyroid disease Mother        Hypothyroid   Hyperlipidemia Mother    Cancer Father    Diabetes Father    CAD Father    Alcohol abuse Brother    Hypertension Maternal Grandfather    Stroke Maternal Grandfather    Hyperlipidemia Maternal Grandfather    Bipolar disorder Daughter     Social History   Socioeconomic History   Marital status: Married    Spouse name: Not on file   Number of children: Not on file   Years of education: Not on file   Highest education level: Not on file  Occupational History   Not on file  Tobacco Use   Smoking status: Former   Smokeless tobacco: Never  Vaping Use   Vaping Use: Never used  Substance and Sexual Activity   Alcohol use: Yes    Comment: rarely   Drug use: No   Sexual activity: Yes    Partners: Male  Other Topics Concern  Not on file  Social History Narrative   Homeschools    [name]- born in 1997   [name]-1999   [name]- 2011 (from second marriage)- homeschools   She is a stay at home mom   No pets   Completed 2 years of college    married (second marriage)   Enjoys Tree surgeon, gardening, herb garden   Daughter and grandson live with her   Social Determinants of Corporate investment banker Strain: Not on file  Food Insecurity: Not on file  Transportation Needs: Not on file  Physical Activity: Not on file  Stress: Not on file  Social Connections: Not on file  Intimate Partner Violence: Not on file    Outpatient Medications Prior to Visit  Medication Sig Dispense Refill   Cyanocobalamin (VITAMIN B-12) 1000 MCG SUBL Take 1 tablet by mouth daily.     meloxicam (MOBIC) 7.5 MG  tablet Take 1 tablet (7.5 mg total) by mouth daily. 14 tablet 0   No facility-administered medications prior to visit.    Allergies  Allergen Reactions   Codeine Nausea Only    Has withdrawal when trying to stop med    Review of Systems  Constitutional:  Negative for weight loss.  HENT:  Negative for congestion and hearing loss.   Eyes:  Negative for blurred vision.  Respiratory:  Negative for cough.   Cardiovascular:  Negative for leg swelling.  Gastrointestinal:  Negative for constipation and diarrhea.  Genitourinary:  Positive for frequency and urgency. Negative for dysuria.  Musculoskeletal:  Positive for back pain and joint pain (bilateral knee pain, hand stiffness in the am).  Skin:  Negative for rash.  Neurological:  Negative for headaches.  Psychiatric/Behavioral:         Denies depression/anxiety       Objective:    Physical Exam  BP 127/76 (BP Location: Right Arm, Patient Position: Sitting, Cuff Size: Small)   Pulse 80   Temp 98 F (36.7 C) (Oral)   Resp 16   Wt 170 lb (77.1 kg)   SpO2 100%   BMI 27.44 kg/m  Wt Readings from Last 3 Encounters:  11/06/22 170 lb (77.1 kg)  10/07/22 171 lb (77.6 kg)  08/02/22 170 lb (77.1 kg)   Physical Exam  Constitutional: She is oriented to person, place, and time. She appears well-developed and well-nourished. No distress.  HENT:  Head: Normocephalic and atraumatic.  Right Ear: Tympanic membrane and ear canal normal.  Left Ear: Tympanic membrane and ear canal normal.  Mouth/Throat: Oropharynx is clear and moist.  Eyes: Pupils are equal, round, and reactive to light. No scleral icterus.  Neck: Normal range of motion. No thyromegaly present.  Cardiovascular: Normal rate and regular rhythm.   2+ systolic murmur heard. Pulmonary/Chest: Effort normal and breath sounds normal. No respiratory distress. He has no wheezes. She has no rales. She exhibits no tenderness.  Abdominal: Soft. Bowel sounds are normal. She exhibits  no distension and no mass. There is no tenderness. There is no rebound and no guarding.  Musculoskeletal: She exhibits no edema.  Lymphadenopathy:    She has no cervical adenopathy.  Neurological: She is alert and oriented to person, place, and time. She has 3+ bilateral patellar reflexes. She exhibits normal muscle tone. Coordination normal.  Skin: Skin is warm and dry.  Psychiatric: She has a normal mood and affect. Her behavior is normal. Judgment and thought content normal.             Assessment &  Plan:       Assessment & Plan:   Problem List Items Addressed This Visit       Unprioritized   Vitamin D deficiency   Relevant Orders   VITAMIN D 25 Hydroxy (Vit-D Deficiency, Fractures)   Preventative health care    Discussed healthy diet, exercise and weight loss.  Mammogram is scheduled. Recommended covid booster and flu shot this fall. Sees gyn for pap and pap is up to date      Relevant Orders   Lipid panel   Positive ANA (antinuclear antibody)    Has consultation scheduled with Rheumatology.       Lumbar radiculopathy    Unchanged.  She was advised by ortho not to take steroids prior to visit with rheumatology. She does have meloxicam on hand and has not tried it. I encouraged her to try the meloxicam.  She has also been referred to physical therapy for back pain by orthopedics. I encouraged her to follow through with that.       Generalized weakness    Overall seems improved.  I suspect that stress was playing a role in how she was feeling.       Elevated blood pressure reading    BP Readings from Last 3 Encounters:  11/06/22 127/76  10/07/22 (!) 130/92  08/02/22 (!) 129/90  BP reading is improved today will continue to monitor.       Other Visit Diagnoses     Urinary frequency    -  Primary   Relevant Orders   Urine Culture   Urinalysis, Routine w reflex microscopic   Encounter for hepatitis C screening test for low risk patient       Relevant  Orders   Hepatitis C Antibody       I am having Charlene Carey maintain her Vitamin B-12 and meloxicam.  No orders of the defined types were placed in this encounter.

## 2022-11-06 NOTE — Assessment & Plan Note (Signed)
Has consultation scheduled with Rheumatology.

## 2022-11-07 ENCOUNTER — Encounter: Payer: Self-pay | Admitting: Radiology

## 2022-11-07 ENCOUNTER — Ambulatory Visit: Payer: No Typology Code available for payment source | Admitting: Radiology

## 2022-11-07 VITALS — BP 136/82 | Ht 66.0 in | Wt 170.0 lb

## 2022-11-07 DIAGNOSIS — N911 Secondary amenorrhea: Secondary | ICD-10-CM | POA: Diagnosis not present

## 2022-11-07 DIAGNOSIS — Z01419 Encounter for gynecological examination (general) (routine) without abnormal findings: Secondary | ICD-10-CM | POA: Diagnosis not present

## 2022-11-07 LAB — HEPATITIS C ANTIBODY: Hepatitis C Ab: NONREACTIVE

## 2022-11-07 LAB — URINE CULTURE
MICRO NUMBER:: 15014354
Result:: NO GROWTH
SPECIMEN QUALITY:: ADEQUATE

## 2022-11-07 NOTE — Progress Notes (Signed)
Charlene Carey April 25, 1976 [id number]   History:  47 y.o. G5P3 presents for annual exam.Was told by her last GYN that she was perimenopausal and was given provera 09/2021--only had spotting. No menses since. Ultrasound last year showed thickened endometrium and had endometrial biopsy benign per patient. MGM had endometrial cancer.   Gynecologic History No LMP recorded. Patient is perimenopausal.   Contraception/Family planning: none Sexually active: yes Last Pap: 2022. Results were: normal per pt, done at [facility] Last mammogram: 11/07/22. Results were: normal  Obstetric History OB History  Gravida Para Term Preterm AB Living  5 3       3   SAB IAB Ectopic Multiple Live Births          3    # Outcome Date GA Lbr Len/2nd Weight Sex Delivery Anes PTL Lv  5 Gravida           4 Gravida           3 Para           2 Para           1 Para              The following portions of the patient's history were reviewed and updated as appropriate: allergies, current medications, past family history, past medical history, past social history, past surgical history, and problem list.  Review of Systems Pertinent items noted in HPI and remainder of comprehensive ROS otherwise negative.   Past medical history, past surgical history, family history and social history were all reviewed and documented in the EPIC chart.   Exam:  Vitals:   11/07/22 0912  BP: 136/82  Weight: 170 lb (77.1 kg)  Height: 5\' 6"  (1.676 m)   Body mass index is 27.44 kg/m.  General appearance:  Normal Thyroid:  Symmetrical, normal in size, without palpable masses or nodularity. Respiratory  Auscultation:  Clear without wheezing or rhonchi Cardiovascular  Auscultation:  Regular rate, without rubs, murmurs or gallops  Edema/varicosities:  Not grossly evident Abdominal  Soft,nontender, without masses, guarding or rebound.  Liver/spleen:  No organomegaly noted  Hernia:  None appreciated  Skin  Inspection:   Grossly normal Breasts: Examined lying and sitting.   Right: Without masses, retractions, nipple discharge or axillary adenopathy.   Left: Without masses, retractions, nipple discharge or axillary adenopathy. Genitourinary   Inguinal/mons:  Normal without inguinal adenopathy  External genitalia:  Normal appearing vulva with no masses, tenderness, or lesions  BUS/Urethra/Skene's glands:  Normal without masses or exudate  Vagina:  Normal appearing with normal color and discharge, no lesions  Cervix:  Normal appearing without discharge or lesions  Uterus:  Normal in size, shape and contour.  Mobile, nontender  Adnexa/parametria:     Rt: Normal in size, without masses or tenderness.   Lt: Normal in size, without masses or tenderness.  Anus and perineum: Normal   Raynelle Fanning, CMA present for exam  Assessment/Plan:   1. Well woman exam with routine gynecological exam Pap due 2027- will obtain records  2. Amenorrhea, secondary If labs show she is not post menopausal will schedule u/s and likely EMB Pt agreeable to plan Discussed the risks of thickened endometrium being left untreated and endometrial cancer - F[facility]     Discussed SBE, colonoscopy and DEXA screening as directed/appropriate. Recommend of exercise weekly, including weight bearing exercise. Encouraged the use of seatbelts and sunscreen. Return in 1 year for annual or as needed.   Kearney Evitt B  WHNP-BC 10:21 AM 11/07/2022

## 2022-11-08 ENCOUNTER — Encounter: Payer: Self-pay | Admitting: Radiology

## 2022-11-08 LAB — FOLLICLE STIMULATING HORMONE: FSH: 95.5 m[IU]/mL

## 2022-11-21 ENCOUNTER — Other Ambulatory Visit: Payer: Self-pay | Admitting: Endocrinology

## 2022-11-21 DIAGNOSIS — E042 Nontoxic multinodular goiter: Secondary | ICD-10-CM

## 2022-11-27 ENCOUNTER — Encounter: Payer: Self-pay | Admitting: Cardiovascular Disease

## 2022-11-27 ENCOUNTER — Ambulatory Visit
Payer: No Typology Code available for payment source | Attending: Cardiovascular Disease | Admitting: Cardiovascular Disease

## 2022-11-27 VITALS — BP 114/78 | HR 76 | Ht 66.0 in | Wt 173.0 lb

## 2022-11-27 DIAGNOSIS — Q230 Congenital stenosis of aortic valve: Secondary | ICD-10-CM

## 2022-11-27 DIAGNOSIS — I712 Thoracic aortic aneurysm, without rupture, unspecified: Secondary | ICD-10-CM

## 2022-11-27 DIAGNOSIS — R0789 Other chest pain: Secondary | ICD-10-CM | POA: Diagnosis not present

## 2022-11-27 DIAGNOSIS — Q231 Congenital insufficiency of aortic valve: Secondary | ICD-10-CM

## 2022-11-27 DIAGNOSIS — E785 Hyperlipidemia, unspecified: Secondary | ICD-10-CM

## 2022-11-27 NOTE — Assessment & Plan Note (Signed)
History of bicuspid aortic stenosis with 2D echo performed most recently 11/19/2021 revealing normal LV systolic function with an aortic valve area of 0.82 cm and a peak gradient of 52 mmHg unchanged from her previous echo.  Her ascending aorta measured 38 mm.  Will recheck a 2D echocardiogram.  She is asymptomatic.

## 2022-11-27 NOTE — Progress Notes (Signed)
11/27/2022 Charlene Carey   Nov 05, 1975  [id number]  Primary Physician Charlene Craze, NP Primary Cardiologist: Charlene Gess MD Charlene Carey  HPI:  Charlene Carey is a 47 y.o.    mildly overweight married Caucasian female mother of 3 children . She saw Dr. Swaziland in the remote past (2011). I last saw her in the office 10/05/2019. There is a history of a murmur from childhood with what she describes as a bicuspid aortic valve. Dr. Swaziland said that she would need this addressed at some point in the future. She has no other cardiovascular risk factors. She has noticed increasing dyspnea on exertion when walking up stairs and fatigue with some chest heaviness. She has had no problems with her deliveries. There is no family history of bicuspid aortic valve sclerosis. Since I saw her last she's gained approximately 15 pounds. She does complain of increasing dyspnea on exertion and some chest burning which she attributes to repeat reflux. A 2-D echocardiogram performed 10/29/16 revealed normal LV function with progression of her aortic stenosis now in the moderate range. Gradient was 54 mmHg with a mean of 35 and a valve area 0.89 cm. She recently was at [facility] and  when trying to keep up with her children and husband noticed increasing dyspnea and chest. Burning rating to her back. Based on this we decided to proceed with outpatient right and left heart cath to define her anatomy and physiology on 11/18/16 revealing normal coronary arteries with only mild aortic stenosis. Her valve area calculated at 1.4 cm with a peak gradient of 20 mmHg. There is clearly a discrepancy between her transthoracic echo and her invasive hemodynamic measurements. I referred her to Dr. Royann Carey for a transesophageal echo which was performed 01/27/17 confirming moderate aortic stenosis with a bicuspid aortic valve and a valve area of 1.3 cm with a peak aortic gradient of 36 mmHg. Her descending  aorta was normal in size. I then referred her to Dr. Cornelius Carey for surgical evaluation/consultation felt that she was not in a point where she required aortic valve replacement.   Since I saw her 3 years ago she continues to do well.  Her major complaint is fatigue.  She denies chest pain or shortness of breath.  Her most recent echo performed 11/19/2021 was unchanged from the prior echo with moderate Lee severe aortic stenosis, valve area of 0.82 cm with peak gradient 52 mmHg.  Charlene Carey send aorta measures 38 mm.  Interestingly, recent lipid profile performed 11/06/2022 revealed total cholesterol 223, LDL of 143 and HDL of 57, not on statin therapy.  Current Meds  Medication Sig   Cyanocobalamin (VITAMIN B-12) 1000 MCG SUBL Take 1 tablet by mouth daily.   [DISCONTINUED] meloxicam (MOBIC) 7.5 MG tablet Take 1 tablet (7.5 mg total) by mouth daily.     Allergies  Allergen Reactions   Cat Hair Extract Other (See Comments)   Codeine Nausea Only    Has withdrawal when trying to stop med   Dust Mite Extract Other (See Comments)    Social History   Socioeconomic History   Marital status: Married    Spouse name: Not on file   Number of children: Not on file   Years of education: Not on file   Highest education level: Not on file  Occupational History   Not on file  Tobacco Use   Smoking status: Former    Types: Cigarettes   Smokeless tobacco: Never  Tobacco comments:    Smoked in college occasionally  Vaping Use   Vaping Use: Never used  Substance and Sexual Activity   Alcohol use: Yes    Comment: rarely   Drug use: No   Sexual activity: Yes    Partners: Male    Birth control/protection: None    Comment: menarche 47yo, sexual debut 47yo  Other Topics Concern   Not on file  Social History Narrative   Homeschools    Charlene Carey- born in 1997   Charlene Carey-1999   Charlene Carey- 2011 (from second marriage)- homeschools   She is a stay at home mom   No pets   Completed 2 years of college    married  (second marriage)   Enjoys Tree surgeon, gardening, herb garden   Daughter and grandson live with her   Social Determinants of Corporate investment banker Strain: Not on file  Food Insecurity: Not on file  Transportation Needs: Not on file  Physical Activity: Not on file  Stress: Not on file  Social Connections: Not on file  Intimate Partner Violence: Not on file     Review of Systems: General: negative for chills, fever, night sweats or weight changes.  Cardiovascular: negative for chest pain, dyspnea on exertion, edema, orthopnea, palpitations, paroxysmal nocturnal dyspnea or shortness of breath Dermatological: negative for rash Respiratory: negative for cough or wheezing Urologic: negative for hematuria Abdominal: negative for nausea, vomiting, diarrhea, bright red blood per rectum, melena, or hematemesis Neurologic: negative for visual changes, syncope, or dizziness All other systems reviewed and are otherwise negative except as noted above.    Blood pressure 114/78, pulse 76, height 5\' 6"  (1.676 m), weight 173 lb (78.5 kg).  General appearance: alert and no distress Neck: no adenopathy, no JVD, supple, symmetrical, trachea midline, thyroid not enlarged, symmetric, no tenderness/mass/nodules, and soft bilateral carotid bruits versus transmitted murmur Lungs: clear to auscultation bilaterally Heart: 2/6 outflow tract murmur consistent with aortic stenosis. Extremities: extremities normal, atraumatic, no cyanosis or edema Pulses: 2+ and symmetric Skin: Skin color, texture, turgor normal. No rashes or lesions Neurologic: Grossly normal  EKG EKG Interpretation  Date/Time:  Wednesday November 27 2022 11:26:50 EDT Ventricular Rate:  76 PR Interval:  128 QRS Duration: 94 QT Interval:  380 QTC Calculation: 427 R Axis:   50 Text Interpretation: Normal sinus rhythm Normal ECG When compared with ECG of 07-Nov-2021 20:05, No significant change was found Confirmed by Charlene Carey  [phone]) on 11/27/2022 11:40:43 AM    ASSESSMENT AND PLAN:   Aortic stenosis due to bicuspid aortic valve History of bicuspid aortic stenosis with 2D echo performed most recently 11/19/2021 revealing normal LV systolic function with an aortic valve area of 0.82 cm and a peak gradient of 52 mmHg unchanged from her previous echo.  Her ascending aorta measured 38 mm.  Will recheck a 2D echocardiogram.  She is asymptomatic.  Hyperlipidemia History of hyperlipidemia not on statin therapy lipid profile performed 11/06/2022 revealing a total cholesterol of 223, LDL 143 and HDL of 57.  Given her normal coronary arteries her LDL goal is less than 100.  She was wished to try dietary modification prior to entertaining a statin drug.  We will recheck a lipid liver profile in 3 months.     Charlene Gess MD FACP,FACC,FAHA, [facility] 11/27/2022 11:52 AM

## 2022-11-27 NOTE — Assessment & Plan Note (Signed)
History of hyperlipidemia not on statin therapy lipid profile performed 11/06/2022 revealing a total cholesterol of 223, LDL 143 and HDL of 57.  Given her normal coronary arteries her LDL goal is less than 100.  She was wished to try dietary modification prior to entertaining a statin drug.  We will recheck a lipid liver profile in 3 months.

## 2022-11-27 NOTE — Patient Instructions (Signed)
Medication Instructions:  Your physician recommends that you continue on your current medications as directed. Please refer to the Current Medication list given to you today.  *If you need a refill on your cardiac medications before your next appointment, please call your pharmacy*   Lab Work: Your physician recommends that you return for lab work in: 3 months for FASTING lipid panel  If you have labs (blood work) drawn today and your tests are completely normal, you will receive your results only by: MyChart Message (if you have MyChart) OR A paper copy in the mail If you have any lab test that is abnormal or we need to change your treatment, we will call you to review the results.   Testing/Procedures: Your physician has requested that you have an echocardiogram. Echocardiography is a painless test that uses sound waves to create images of your heart. It provides your doctor with information about the size and shape of your heart and how well your heart's chambers and valves are working. This procedure takes approximately one hour. There are no restrictions for this procedure. Please do NOT wear cologne, perfume, aftershave, or lotions (deodorant is allowed). Please arrive 15 minutes prior to your appointment time. This will take place at [address] [place]    Follow-Up: At [facility], you and your health needs are our priority.  As part of our continuing mission to provide you with exceptional heart care, we have created designated Provider Care Teams.  These Care Teams include your primary Cardiologist (physician) and Advanced Practice Providers (APPs -  Physician Assistants and Nurse Practitioners) who all work together to provide you with the care you need, when you need it.  We recommend signing up for the patient portal called "MyChart".  Sign up information is provided on this After Visit Summary.  MyChart is used to connect with patients for Virtual Visits  (Telemedicine).  Patients are able to view lab/test results, encounter notes, upcoming appointments, etc.  Non-urgent messages can be sent to your provider as well.   To learn more about what you can do with MyChart, go to [email or web address].    Your next appointment:   6 month(s)  Provider:   Edd Fabian, FNP       Then, Nanetta Batty, MD will plan to see you again in 12 month(s).     Other Instructions Heart-Healthy Eating Plan Eating a healthy diet is important for the health of your heart. A heart-healthy eating plan includes: Eating less unhealthy fats. Eating more healthy fats. Eating less salt in your food. Salt is also called sodium. Making other changes in your diet.. Cooking Avoid frying your food. Try to bake, boil, grill, or broil it instead. You can also reduce fat by: Removing the skin from poultry. Removing all visible fats from meats. Steaming vegetables in water or broth. Meal planning  At meals, divide your plate into four equal parts: Fill one-half of your plate with vegetables and green salads. Fill one-fourth of your plate with whole grains. Fill one-fourth of your plate with lean protein foods. Eat 2-4 cups of vegetables per day. One cup of vegetables is: 1 cup (91 g) broccoli or cauliflower florets. 2 medium carrots. 1 large bell pepper. 1 large sweet potato. 1 large tomato. 1 medium white potato. 2 cups (150 g) raw leafy greens. Eat 1-2 cups of fruit per day. One cup of fruit is: 1 small apple 1 large banana 1 cup (237 g) mixed fruit, 1  large orange,  cup (82 g) dried fruit, 1 cup (240 mL) 100% fruit juice. Eat more foods that have soluble fiber. These are apples, broccoli, carrots, beans, peas, and barley. Try to get 20-30 g of fiber per day. Eat 4-5 servings of nuts, legumes, and seeds per week: 1 serving of dried beans or legumes equals  cup (90 g) cooked. 1 serving of nuts is  oz (12 almonds, 24 pistachios, or 7 walnut  halves). 1 serving of seeds equals  oz (8 g). General information Eat more home-cooked food. Eat less restaurant, buffet, and fast food. Limit or avoid alcohol. Limit foods that are high in starch and sugar. Avoid fried foods. Lose weight if you are overweight. Keep track of how much salt (sodium) you eat. This is important if you have high blood pressure. Ask your doctor to tell you more about this. Try to add vegetarian meals each week. Fats Choose healthy fats. These include olive oil and canola oil, flaxseeds, walnuts, almonds, and seeds. Eat more omega-3 fats. These include salmon, mackerel, sardines, tuna, flaxseed oil, and ground flaxseeds. Try to eat fish at least 2 times each week. Check food labels. Avoid foods with trans fats or high amounts of saturated fat. Limit saturated fats. These are often found in animal products, such as meats, butter, and cream. These are also found in plant foods, such as palm oil, palm kernel oil, and coconut oil. Avoid foods with partially hydrogenated oils in them. These have trans fats. Examples are stick margarine, some tub margarines, cookies, crackers, and other baked goods. What foods should I eat? Fruits All fresh, canned (in natural juice), or frozen fruits. Vegetables Fresh or frozen vegetables (raw, steamed, roasted, or grilled). Green salads. Grains Most grains. Choose whole wheat and whole grains most of the time. Rice and pasta, including brown rice and pastas made with whole wheat. Meats and other proteins Lean, well-trimmed beef, veal, pork, and lamb. Chicken and [place] without skin. All fish and shellfish. Wild duck, rabbit, pheasant, and venison. Egg whites or low-cholesterol egg substitutes. Dried beans, peas, lentils, and tofu. Seeds and most nuts. Dairy Low-fat or nonfat cheeses, including ricotta and mozzarella. Skim or 1% milk that is liquid, powdered, or evaporated. Buttermilk that is made with low-fat milk. Nonfat or  low-fat yogurt. Fats and oils Non-hydrogenated (trans-free) margarines. Vegetable oils, including soybean, sesame, sunflower, olive, peanut, safflower, corn, canola, and cottonseed. Salad dressings or mayonnaise made with a vegetable oil. Beverages Mineral water. Coffee and tea. Diet carbonated beverages. Sweets and desserts Sherbet, gelatin, and fruit ice. Small amounts of dark chocolate. Limit all sweets and desserts. Seasonings and condiments All seasonings and condiments. The items listed above may not be a complete list of foods and drinks you can eat. Contact a dietitian for more options. What foods should I avoid? Fruits Canned fruit in heavy syrup. Fruit in cream or butter sauce. Fried fruit. Limit coconut. Vegetables Vegetables cooked in cheese, cream, or butter sauce. Fried vegetables. Grains Breads that are made with saturated or trans fats, oils, or whole milk. Croissants. Sweet rolls. Donuts. High-fat crackers, such as cheese crackers. Meats and other proteins Fatty meats, such as hot dogs, ribs, sausage, bacon, rib-eye roast or steak. High-fat deli meats, such as salami and bologna. Caviar. Domestic duck and goose. Organ meats, such as liver. Dairy Cream, sour cream, cream cheese, and creamed cottage cheese. Whole-milk cheeses. Whole or 2% milk that is liquid, evaporated, or condensed. Whole buttermilk. Cream sauce or high-fat cheese  sauce. Yogurt that is made from whole milk. Fats and oils Meat fat, or shortening. Cocoa butter, hydrogenated oils, palm oil, coconut oil, palm kernel oil. Solid fats and shortenings, including bacon fat, salt pork, lard, and butter. Nondairy cream substitutes. Salad dressings with cheese or sour cream. Beverages Regular sodas and juice drinks with added sugar. Sweets and desserts Frosting. Pudding. Cookies. Cakes. Pies. Milk chocolate or white chocolate. Buttered syrups. Full-fat ice cream or ice cream drinks. The items listed above may not be  a complete list of foods and drinks to avoid. Contact a dietitian for more information. Summary Heart-healthy meal planning includes eating less unhealthy fats, eating more healthy fats, and making other changes in your diet. Eat a balanced diet. This includes fruits and vegetables, low-fat or nonfat dairy, lean protein, nuts and legumes, whole grains, and heart-healthy oils and fats. This information is not intended to replace advice given to you by your health care provider. Make sure you discuss any questions you have with your health care provider. Document Revised: 07/02/2021 Document Reviewed: 07/02/2021 Elsevier Patient Education  2024 [employer].

## 2022-12-09 ENCOUNTER — Other Ambulatory Visit: Payer: Self-pay

## 2022-12-09 DIAGNOSIS — R102 Pelvic and perineal pain: Secondary | ICD-10-CM

## 2022-12-09 DIAGNOSIS — N950 Postmenopausal bleeding: Secondary | ICD-10-CM

## 2022-12-16 ENCOUNTER — Other Ambulatory Visit (INDEPENDENT_AMBULATORY_CARE_PROVIDER_SITE_OTHER): Payer: No Typology Code available for payment source

## 2022-12-16 DIAGNOSIS — E042 Nontoxic multinodular goiter: Secondary | ICD-10-CM | POA: Diagnosis not present

## 2022-12-16 LAB — T3, FREE: T3, Free: 3.1 pg/mL (ref 2.3–4.2)

## 2022-12-16 LAB — T4, FREE: Free T4: 1.03 ng/dL (ref 0.60–1.60)

## 2022-12-16 LAB — TSH: TSH: 0.28 u[IU]/mL — ABNORMAL LOW (ref 0.35–5.50)

## 2022-12-18 ENCOUNTER — Telehealth: Payer: Self-pay

## 2022-12-18 DIAGNOSIS — R102 Pelvic and perineal pain: Secondary | ICD-10-CM

## 2022-12-18 DIAGNOSIS — N950 Postmenopausal bleeding: Secondary | ICD-10-CM

## 2022-12-18 NOTE — Telephone Encounter (Signed)
Per result note from 11/07/2022: schedule pt for [place] and OV w/ JC.   [place] no longer available in office, pt notified and voiced understanding. [place] order placed and pt provided with # to [facility] Centralized Scheduling Desk to call and schedule appt.

## 2022-12-19 ENCOUNTER — Ambulatory Visit: Payer: No Typology Code available for payment source | Admitting: Endocrinology

## 2022-12-19 ENCOUNTER — Encounter: Payer: Self-pay | Admitting: Endocrinology

## 2022-12-19 VITALS — BP 119/72 | HR 70 | Ht 66.0 in | Wt 171.0 lb

## 2022-12-19 DIAGNOSIS — R7989 Other specified abnormal findings of blood chemistry: Secondary | ICD-10-CM | POA: Diagnosis not present

## 2022-12-19 DIAGNOSIS — E042 Nontoxic multinodular goiter: Secondary | ICD-10-CM

## 2022-12-19 NOTE — Progress Notes (Signed)
Patient ID: Charlene Carey, female   DOB: 07-01-1975, 47 y.o.   MRN: [id number]           Referring provider: Sandford Craze  Reason for Appointment: Follow-up of thyroid    History of Present Illness:   MULTINODULAR goiter  Previous history: The patient's thyroid enlargement was first discovered in 2010 or so when she was pregnant and her gynecologist noticed a thyroid swelling and also she was told that her thyroid test was slightly abnormal  She was referred to an endocrinologist who sent her for thyroid biopsy, this was done in 2010 on 2 of the dominant nodules, both benign Based on her ultrasound and exam she was recommended another ultrasound 6 months after her visit in 01/2017 but she did not come back for follow-up Has had normal thyroid levels before   RECENT HISTORY:  She was last seen in 10/22  Although previously had some difficulty with swallowing at times she is not noticing this now Also no local pressure sensation or discomfort in the neck  She may have occasional palpitations at night as before As before she usually has a slightly low TSH with normal T4 and free T3 levels, these are about the same now   Lab Results  Component Value Date   FREET4 1.03 12/16/2022   FREET4 1.32 10/08/2022   FREET4 1.21 03/30/2021   TSH 0.28 (L) 12/16/2022   TSH 0.27 (L) 10/07/2022   TSH 0.29 (L) 03/30/2021   Lab Results  Component Value Date   T3FREE 3.1 12/16/2022   T3FREE 3.2 10/08/2022   History of multiple thyroid nodules:  She has had an ultrasound exam in 5/21 which showed the following  IMPRESSION: 1. Multinodular goiter. 2. Nodule 2 is a TR 3 nodule in the isthmus. Nodule has minimally changed since 2018. Recommend continued follow-up to ensure at least 5 years of stability. 3. Nodule 4 is an isoechoic nodule in the mid right thyroid lobe. This nodule is a TR 3 nodule and meets criteria for 1 year follow-up. 4. Nodule 5 likely represents the  previously biopsied nodule from 2010. This nodule has enlarged in size, now 3.3 cm 5. Nodule 7 in the posterior mid left thyroid lobe may represent the previously biopsied nodule. This nodule has decreased in size since 2018.  Prior ultrasound in 2018: Right mid nodule measuring 2.6 x 1.9 x 1.7 cm previously measured 2.1 x 1.8 x 1.4 cm. There are calcifications within the nodule. This was previously biopsied in 2010.   Left mid nodule today measuring 2.3 x 1.4 x 1.0 cm previously measured 2.1 x 1.4 x 0.8 cm. This underwent biopsy in 2010.  She also has a right mid nodule which is 1.8 centimeters in maximum size which is solid and hypoechoic with T1 Rads score 4 points  Thyroid biopsy in  2010 showed the following from the 2 nodules, both reports were similar   INTERPRETATION(S):  BENIGN  Comment: There is colloid with admixed lymphocytes suggesting a background of chronic thyroiditis.  There are also follicular epithelial cells predominantly in flat sheets.  The findings suggest a non-neoplastic process such as a non-neoplastic goiter/hyperplastic nodule    Allergies as of 12/19/2022       Reactions   Cat Hair Extract Other (See Comments)   Codeine Nausea Only   Has withdrawal when trying to stop med   Dust Mite Extract Other (See Comments)        Medication List  Accurate as of December 19, 2022 12:03 PM. If you have any questions, ask your nurse or doctor.          Vitamin B-12 1000 MCG Subl Take 1 tablet by mouth daily.   Vitamin D (Ergocalciferol) 1.25 MG (50000 UNIT) Caps capsule Commonly known as: DRISDOL Take 50,000 Units by mouth every 7 (seven) days.        Allergies:  Allergies  Allergen Reactions   Cat Hair Extract Other (See Comments)   Codeine Nausea Only    Has withdrawal when trying to stop med   Dust Mite Extract Other (See Comments)    Past Medical History:  Diagnosis Date   Allergy-induced asthma    Aortic stenosis with bicuspid  valve    Arthritis    Ascending aortic aneurysm (HCC)    Frequent headaches    Heartburn    History of chicken pox    History of gestational diabetes    Hyperglycemia    Multinodular goiter    Seasonal allergies    Subclinical hyperthyroidism     Past Surgical History:  Procedure Laterality Date   BREAST REDUCTION SURGERY     OTHER SURGICAL HISTORY     had d& c   RIGHT/LEFT HEART CATH AND CORONARY ANGIOGRAPHY N/A 11/18/2016   Procedure: Right/Left Heart Cath and Coronary Angiography;  Surgeon: Runell Gess, MD;  Location: [facility] INVASIVE CV LAB;  Service: Cardiovascular;  Laterality: N/A;   TEE WITHOUT CARDIOVERSION N/A 01/27/2017   Procedure: TRANSESOPHAGEAL ECHOCARDIOGRAM (TEE);  Surgeon: Thurmon Fair, MD;  Location: [facility] ENDOSCOPY;  Service: Cardiovascular;  Laterality: N/A;    Family History  Problem Relation Age of Onset   Hypertension Mother    Thyroid disease Mother        Hypothyroid   Hyperlipidemia Mother    Cancer Father    Diabetes Father    CAD Father    Alcohol abuse Brother    Breast cancer Maternal Aunt        onset at 8   Colon cancer Maternal Uncle        under 50 at onset   Lung cancer Maternal Uncle    Breast cancer Paternal Aunt        unsure of onset age   Endometrial cancer Maternal Grandmother    Hypertension Maternal Grandfather    Stroke Maternal Grandfather    Hyperlipidemia Maternal Grandfather    Bipolar disorder Daughter        patient reports daughter does not have bipolar disorder   Breast cancer Other        breast cancer    Social History:  reports that she has quit smoking. Her smoking use included cigarettes. She has never used smokeless tobacco. She reports current alcohol use. She reports that she does not use drugs.   Review of Systems:  Review of Systems Wt Readings from Last 3 Encounters:  12/19/22 171 lb (77.6 kg)  11/27/22 173 lb (78.5 kg)  11/07/22 170 lb (77.1 kg)        Examination:   BP 119/72 (BP  Location: Right Arm, Patient Position: Sitting)   Pulse 70   Ht 5\' 6"  (1.676 m)   Wt 171 lb (77.6 kg)   SpO2 99%   BMI 27.60 kg/m   THYROID is enlarged diffusely on palpation but not well-seen on inspection  Neck circumference is about 36.5 cm over the thyroid The right lobe is about twice normal, slightly nodular and firm, extending to the  isthmus  Left lobe measures about 2-2.5 times normal twice normal, slightly firm with nodular texture  There is no stridor  There is no lymphadenopathy.     Biceps reflexes appear normal   Assessment/Plan:   Multinodular goiter, long-standing, previously on biopsy had shown nonneoplastic goiter with some lymphocytes Previous ultrasound reports were reviewed  Her exam today shows likely an increased circumference of her neck over the thyroid Compared to last visit her left lobe appears more prominent Subjectively she is asymptomatic  She has had previous biopsies and relatively stable nodules However since her dominant logical in the left side may be relatively larger on exam will order another ultrasound  Reminded her to make annual follow-up visits  Abnormal TSH: This is minimal and again represents mildly autonomous thyroid Free T3 and T4 are quite normal again   Reather Littler 12/19/2022

## 2022-12-19 NOTE — Progress Notes (Deleted)
Office Visit Note  Patient: Charlene Carey             Date of Birth: 1975-08-14           MRN: [id number]             PCP: Sandford Craze, NP Referring: Sandford Craze, NP Visit Date: 12/20/2022 Occupation: @GUAROCC @  Subjective:  No chief complaint on file.   History of Present Illness: Charlene Carey is a 47 y.o. female ***     Activities of Daily Living:  Patient reports morning stiffness for *** {minute/hour:19697}.   Patient {ACTIONS;DENIES/REPORTS:21021675::"Denies"} nocturnal pain.  Difficulty dressing/grooming: {ACTIONS;DENIES/REPORTS:21021675::"Denies"} Difficulty climbing stairs: {ACTIONS;DENIES/REPORTS:21021675::"Denies"} Difficulty getting out of chair: {ACTIONS;DENIES/REPORTS:21021675::"Denies"} Difficulty using hands for taps, buttons, cutlery, and/or writing: {ACTIONS;DENIES/REPORTS:21021675::"Denies"}  No Rheumatology ROS completed.   PMFS History:  Patient Active Problem List   Diagnosis Date Noted   Hyperlipidemia 11/27/2022   Lumbar radiculopathy 11/06/2022   Positive ANA (antinuclear antibody) 11/06/2022   Elevated blood pressure reading 10/07/2022   Generalized weakness 10/07/2022   Preventative health care 01/31/2021   Subclinical hyperthyroidism 12/21/2020   Hyperglycemia 12/21/2020   Irregular menses 12/20/2020   Paresthesia 12/20/2020   Vitamin D deficiency 12/20/2020   Carpal tunnel syndrome of left wrist 12/20/2020   Anxiety 12/20/2020   Ascending aortic aneurysm (HCC)    Multinodular goiter    Atypical chest pain 10/16/2016   Dyspnea on exertion 10/16/2016   Aortic stenosis due to bicuspid aortic valve     Past Medical History:  Diagnosis Date   Allergy-induced asthma    Aortic stenosis with bicuspid valve    Arthritis    Ascending aortic aneurysm (HCC)    Frequent headaches    Heartburn    History of chicken pox    History of gestational diabetes    Hyperglycemia    Multinodular goiter    Seasonal  allergies    Subclinical hyperthyroidism     Family History  Problem Relation Age of Onset   Hypertension Mother    Thyroid disease Mother        Hypothyroid   Hyperlipidemia Mother    Cancer Father    Diabetes Father    CAD Father    Alcohol abuse Brother    Breast cancer Maternal Aunt        onset at 43   Colon cancer Maternal Uncle        under 50 at onset   Lung cancer Maternal Uncle    Breast cancer Paternal Aunt        unsure of onset age   Endometrial cancer Maternal Grandmother    Hypertension Maternal Grandfather    Stroke Maternal Grandfather    Hyperlipidemia Maternal Grandfather    Bipolar disorder Daughter        patient reports daughter does not have bipolar disorder   Breast cancer Other        breast cancer   Past Surgical History:  Procedure Laterality Date   BREAST REDUCTION SURGERY     OTHER SURGICAL HISTORY     had d& c   RIGHT/LEFT HEART CATH AND CORONARY ANGIOGRAPHY N/A 11/18/2016   Procedure: Right/Left Heart Cath and Coronary Angiography;  Surgeon: Runell Gess, MD;  Location: [facility] INVASIVE CV LAB;  Service: Cardiovascular;  Laterality: N/A;   TEE WITHOUT CARDIOVERSION N/A 01/27/2017   Procedure: TRANSESOPHAGEAL ECHOCARDIOGRAM (TEE);  Surgeon: Thurmon Fair, MD;  Location: [facility] ENDOSCOPY;  Service: Cardiovascular;  Laterality: N/A;   Social History  Social History Narrative   Homeschools    [name]- born in 1997   [name]-1999   [name]- 2011 (from second marriage)- homeschools   She is a stay at home mom   No pets   Completed 2 years of college    married (second marriage)   Enjoys Tree surgeon, gardening, herb garden   Daughter and grandson live with her   Immunization History  Administered Date(s) Administered   [employer] Vaccination 01/13/2020   Td 06/10/2009   Tdap 03/02/2022     Objective: Vital Signs: There were no vitals taken for this visit.   Physical Exam   Musculoskeletal Exam: ***  CDAI Exam: CDAI Score:  -- Patient Global: --; Provider Global: -- Swollen: --; Tender: -- Joint Exam 12/20/2022   No joint exam has been documented for this visit   There is currently no information documented on the homunculus. Go to the Rheumatology activity and complete the homunculus joint exam.  Investigation: No additional findings.  Imaging: No results found.  Recent Labs: Lab Results  Component Value Date   WBC 5.6 10/07/2022   HGB 14.1 10/07/2022   PLT 200.0 10/07/2022   NA 139 10/07/2022   K 4.5 10/07/2022   CL 103 10/07/2022   CO2 29 10/07/2022   GLUCOSE 98 10/07/2022   BUN 11 10/07/2022   CREATININE 0.85 10/07/2022   BILITOT 0.4 10/07/2022   ALKPHOS 54 10/07/2022   AST 18 10/07/2022   ALT 18 10/07/2022   PROT 7.2 10/07/2022   ALBUMIN 4.6 10/07/2022   CALCIUM 10.0 10/07/2022   GFRAA 97 11/08/2016    Speciality Comments: No specialty comments available.  Procedures:  No procedures performed Allergies: Cat hair extract, Codeine, and Dust mite extract   Assessment / Plan:     Visit Diagnoses: No diagnosis found.  Orders: No orders of the defined types were placed in this encounter.  No orders of the defined types were placed in this encounter.   Face-to-face time spent with patient was *** minutes. Greater than 50% of time was spent in counseling and coordination of care.  Follow-Up Instructions: No follow-ups on file.   Fuller Plan, MD  Note - This record has been created using [employer].  Chart creation errors have been sought, but may not always  have been located. Such creation errors do not reflect on  the standard of medical care.

## 2022-12-20 ENCOUNTER — Encounter: Payer: No Typology Code available for payment source | Admitting: Internal Medicine

## 2022-12-24 ENCOUNTER — Telehealth: Payer: Self-pay | Admitting: Family

## 2022-12-24 DIAGNOSIS — R768 Other specified abnormal immunological findings in serum: Secondary | ICD-10-CM

## 2022-12-24 NOTE — Telephone Encounter (Signed)
Patient would like her Rheumatology referral sent to   [facility] Fax [phone]

## 2022-12-25 NOTE — Addendum Note (Signed)
Addended by: Sandford Craze on: 12/25/2022 07:49 AM   Modules accepted: Orders

## 2022-12-26 ENCOUNTER — Ambulatory Visit (HOSPITAL_COMMUNITY)
Admission: RE | Admit: 2022-12-26 | Discharge: 2022-12-26 | Disposition: A | Discharge status: Home / Self Care | Payer: No Typology Code available for payment source | Source: Ambulatory Visit | Attending: Radiology | Admitting: Radiology

## 2022-12-26 ENCOUNTER — Ambulatory Visit (HOSPITAL_BASED_OUTPATIENT_CLINIC_OR_DEPARTMENT_OTHER): Payer: No Typology Code available for payment source

## 2022-12-26 ENCOUNTER — Ambulatory Visit (HOSPITAL_COMMUNITY)
Admission: RE | Admit: 2022-12-26 | Discharge: 2022-12-26 | Disposition: A | Discharge status: Home / Self Care | Payer: No Typology Code available for payment source | Source: Ambulatory Visit | Attending: Endocrinology | Admitting: Endocrinology

## 2022-12-26 DIAGNOSIS — N950 Postmenopausal bleeding: Secondary | ICD-10-CM | POA: Diagnosis present

## 2022-12-26 DIAGNOSIS — R102 Pelvic and perineal pain: Secondary | ICD-10-CM | POA: Diagnosis present

## 2022-12-26 DIAGNOSIS — I712 Thoracic aortic aneurysm, without rupture, unspecified: Secondary | ICD-10-CM

## 2022-12-26 DIAGNOSIS — Q230 Congenital stenosis of aortic valve: Secondary | ICD-10-CM

## 2022-12-26 DIAGNOSIS — R0789 Other chest pain: Secondary | ICD-10-CM | POA: Diagnosis not present

## 2022-12-26 DIAGNOSIS — Q231 Congenital insufficiency of aortic valve: Secondary | ICD-10-CM

## 2022-12-26 DIAGNOSIS — E042 Nontoxic multinodular goiter: Secondary | ICD-10-CM

## 2022-12-26 LAB — ECHOCARDIOGRAM COMPLETE
AR max vel: 0.72 cm2
AV Area VTI: 0.78 cm2
AV Area mean vel: 0.65 cm2
AV Mean grad: 34 mmHg
AV Peak grad: 57.2 mmHg
Ao pk vel: 3.78 m/s
Area-P 1/2: 3.42 cm2
S' Lateral: 2.4 cm

## 2022-12-31 NOTE — Progress Notes (Signed)
Normal, reassuring ultrasound. Monitor for any further bleeding.

## 2022-12-31 NOTE — Progress Notes (Signed)
Please call to let patient know that the ultrasound shows no increase in size of the nodules and no intervention needed

## 2023-01-01 NOTE — Telephone Encounter (Signed)
Pt had US performed on 12/26/2022. Results have been reviewed and notified to pt. Will close encounter.

## 2023-02-18 ENCOUNTER — Ambulatory Visit
Admission: EM | Admit: 2023-02-18 | Discharge: 2023-02-18 | Disposition: A | Discharge status: Home / Self Care | Payer: No Typology Code available for payment source | Attending: Family Medicine | Admitting: Family Medicine

## 2023-02-18 DIAGNOSIS — N898 Other specified noninflammatory disorders of vagina: Secondary | ICD-10-CM | POA: Diagnosis present

## 2023-02-18 DIAGNOSIS — N3001 Acute cystitis with hematuria: Secondary | ICD-10-CM | POA: Insufficient documentation

## 2023-02-18 DIAGNOSIS — R300 Dysuria: Secondary | ICD-10-CM | POA: Insufficient documentation

## 2023-02-18 LAB — POCT URINALYSIS DIP (MANUAL ENTRY)
Bilirubin, UA: NEGATIVE
Glucose, UA: NEGATIVE mg/dL
Ketones, POC UA: NEGATIVE mg/dL
Nitrite, UA: POSITIVE — AB
Spec Grav, UA: 1.02 (ref 1.010–1.025)
Urobilinogen, UA: 0.2 U/dL
pH, UA: 6 (ref 5.0–8.0)

## 2023-02-18 MED ORDER — PHENAZOPYRIDINE HCL 200 MG PO TABS: 200.0000 mg | ORAL_TABLET | Freq: Three times a day (TID) | ORAL | 0 refills | Status: DC

## 2023-02-18 MED ORDER — NITROFURANTOIN MONOHYD MACRO 100 MG PO CAPS: 100.0000 mg | ORAL_CAPSULE | Freq: Two times a day (BID) | ORAL | 0 refills | Status: DC

## 2023-02-18 MED ORDER — PHENAZOPYRIDINE HCL 200 MG PO TABS
200.0000 mg | ORAL_TABLET | Freq: Once | ORAL | Status: AC
  Administered 2023-02-18: 200 mg via ORAL

## 2023-02-18 NOTE — ED Provider Notes (Signed)
[facility]    CSN: [id number] Arrival date & time: 02/18/23  1821      History   Chief Complaint Chief Complaint  Patient presents with   Vaginal Irritation   Urinary Frequency    HPI Charlene Carey is a 47 y.o. female.   Patient has been having urinary discomfort off and on for 2 weeks.  She states she will push fluids or take cranberry juice and her symptoms improve, then they come back again.  Today her symptoms have been unremitting with dysuria and frequency, urgency and getting to the bathroom.  Difficulty emptying her bladder.  No flank pain, fever or chills, kidney symptoms.  No charge    Past Medical History:  Diagnosis Date   Allergy-induced asthma    Aortic stenosis with bicuspid valve    Arthritis    Ascending aortic aneurysm (HCC)    Frequent headaches    Heartburn    History of chicken pox    History of gestational diabetes    Hyperglycemia    Multinodular goiter    Seasonal allergies    Subclinical hyperthyroidism     Patient Active Problem List   Diagnosis Date Noted   Hyperlipidemia 11/27/2022   Lumbar radiculopathy 11/06/2022   Positive ANA (antinuclear antibody) 11/06/2022   Elevated blood pressure reading 10/07/2022   Generalized weakness 10/07/2022   Preventative health care 01/31/2021   Subclinical hyperthyroidism 12/21/2020   Hyperglycemia 12/21/2020   Irregular menses 12/20/2020   Paresthesia 12/20/2020   Vitamin D deficiency 12/20/2020   Carpal tunnel syndrome of left wrist 12/20/2020   Anxiety 12/20/2020   Ascending aortic aneurysm (HCC)    Multinodular goiter    Atypical chest pain 10/16/2016   Dyspnea on exertion 10/16/2016   Aortic stenosis due to bicuspid aortic valve     Past Surgical History:  Procedure Laterality Date   BREAST REDUCTION SURGERY     OTHER SURGICAL HISTORY     had d& c   RIGHT/LEFT HEART CATH AND CORONARY ANGIOGRAPHY N/A 11/18/2016   Procedure: Right/Left Heart Cath and Coronary  Angiography;  Surgeon: Runell Gess, MD;  Location: [facility] INVASIVE CV LAB;  Service: Cardiovascular;  Laterality: N/A;   TEE WITHOUT CARDIOVERSION N/A 01/27/2017   Procedure: TRANSESOPHAGEAL ECHOCARDIOGRAM (TEE);  Surgeon: Thurmon Fair, MD;  Location: [facility]OS[facility];  Service: Cardiovascular;  Laterality: N/A;    OB History     Gravida  5   Para  3   Term      Preterm      AB      Living  3      SAB      IAB      Ectopic      Multiple      Live Births  3            Home Medications    Prior to Admission medications   Medication Sig Start Date End Date Taking? Authorizing Provider  nitrofurantoin, macrocrystal-monohydrate, (MACROBID) 100 MG capsule Take 1 capsule (100 mg total) by mouth 2 (two) times daily. 02/18/23  Yes Eustace Moore, MD  phenazopyridine (PYRIDIUM) 200 MG tablet Take 1 tablet (200 mg total) by mouth 3 (three) times daily. 02/18/23  Yes Eustace Moore, MD  Cyanocobalamin (VITAMIN B-12) 1000 MCG SUBL Take 1 tablet by mouth daily.    [provider]  Vitamin D, Ergocalciferol, (DRISDOL) 1.25 MG (50000 UNIT) CAPS capsule Take 50,000 Units by mouth every 7 (seven) days.  [provider]    Family History Family History  Problem Relation Age of Onset   Hypertension Mother    Thyroid disease Mother        Hypothyroid   Hyperlipidemia Mother    Cancer Father    Diabetes Father    CAD Father    Alcohol abuse Brother    Breast cancer Maternal Aunt        onset at 32   Colon cancer Maternal Uncle        under 50 at onset   Lung cancer Maternal Uncle    Breast cancer Paternal Aunt        unsure of onset age   Endometrial cancer Maternal Grandmother    Hypertension Maternal Grandfather    Stroke Maternal Grandfather    Hyperlipidemia Maternal Grandfather    Bipolar disorder Daughter        patient reports daughter does not have bipolar disorder   Breast cancer Other        breast cancer    Social  History Social History   Tobacco Use   Smoking status: Former    Types: Cigarettes   Smokeless tobacco: Never   Tobacco comments:    Smoked in college occasionally  Vaping Use   Vaping status: Never Used  Substance Use Topics   Alcohol use: Yes    Comment: rarely   Drug use: No     Allergies   Cat hair extract, Codeine, and Dust mite extract   Review of Systems Review of Systems  See HPI Physical Exam Triage Vital Signs ED Triage Vitals  Encounter Vitals Group     BP 02/18/23 1828 (!) 145/92     Systolic BP Percentile --      Diastolic BP Percentile --      Pulse Rate 02/18/23 1828 (!) 103     Resp 02/18/23 1828 17     Temp 02/18/23 1828 98.3 F (36.8 C)     Temp Source 02/18/23 1828 Oral     SpO2 02/18/23 1828 98 %     Weight --      Height --      Head Circumference --      Peak Flow --      Pain Score 02/18/23 1830 0     Pain Loc --      Pain Education --      Exclude from Growth Chart --    No data found.  Updated Vital Signs BP (!) 145/92 (BP Location: Right Arm)   Pulse (!) 103   Temp 98.3 F (36.8 C) (Oral)   Resp 17   LMP  (LMP Unknown)   SpO2 98%        Physical Exam Constitutional:      General: She is not in acute distress.    Appearance: She is well-developed.  HENT:     Head: Normocephalic and atraumatic.  Eyes:     Conjunctiva/sclera: Conjunctivae normal.     Pupils: Pupils are equal, round, and reactive to light.  Cardiovascular:     Rate and Rhythm: Normal rate.  Pulmonary:     Effort: Pulmonary effort is normal. No respiratory distress.  Abdominal:     General: There is no distension.     Palpations: Abdomen is soft.     Tenderness: There is no abdominal tenderness. There is no right CVA tenderness or left CVA tenderness.  Musculoskeletal:        General: Normal range of  motion.     Cervical back: Normal range of motion.  Skin:    General: Skin is warm and dry.  Neurological:     Mental Status: She is alert.       UC Treatments / Results  Labs (all labs ordered are listed, but only abnormal results are displayed) Labs Reviewed  POCT URINALYSIS DIP (MANUAL ENTRY) - Abnormal; Notable for the following components:      Result Value   Clarity, UA cloudy (*)    Blood, UA large (*)    Protein Ur, POC trace (*)    Nitrite, UA Positive (*)    Leukocytes, UA Large (3+) (*)    All other components within normal limits  URINE CULTURE    EKG   Radiology No results found.  Procedures Procedures (including critical care time)  Medications Ordered in UC Medications  phenazopyridine (PYRIDIUM) tablet 200 mg (200 mg Oral Given 02/18/23 1918)    Initial Impression / Assessment and Plan / UC Course  I have reviewed the triage vital signs and the nursing notes.  Pertinent labs & imaging results that were available during my care of the patient were reviewed by me and considered in my medical decision making (see chart for details).    Patient is given a Pyridium in the office because of her discomfort. Final Clinical Impressions(s) / UC Diagnoses   Final diagnoses:  Dysuria  Acute cystitis with hematuria     Discharge Instructions      Drink lots of water Take the nitrofurantoin antibiotic 2 times a day for 5 days Take the Pyridium (phenazopyridine) 3 times a day as needed for urinary discomfort.  This makes your urine a bright orange.  I recommend a panty liner to protect your clothing  I have sent your urine for culture.  You will be called if any change in antibiotics is indicated   ED Prescriptions     Medication Sig Dispense Auth. Provider   nitrofurantoin, macrocrystal-monohydrate, (MACROBID) 100 MG capsule Take 1 capsule (100 mg total) by mouth 2 (two) times daily. 10 capsule Eustace Moore, MD   phenazopyridine (PYRIDIUM) 200 MG tablet Take 1 tablet (200 mg total) by mouth 3 (three) times daily. 6 tablet Eustace Moore, MD      PDMP not reviewed this  encounter.   Eustace Moore, MD 02/18/23 Ernestina Columbia

## 2023-02-18 NOTE — Discharge Instructions (Signed)
Drink lots of water Take the nitrofurantoin antibiotic 2 times a day for 5 days Take the Pyridium (phenazopyridine) 3 times a day as needed for urinary discomfort.  This makes your urine a bright orange.  I recommend a panty liner to protect your clothing  I have sent your urine for culture.  You will be called if any change in antibiotics is indicated

## 2023-02-18 NOTE — ED Triage Notes (Signed)
Pt c/o vaginal irritation and urinary frequency x 2 weeks. Cranberry juice, changes in soaps with no relief. Denies any abnormal discharge.

## 2023-02-21 LAB — URINE CULTURE: Culture: >=100000 — AB

## 2023-02-25 ENCOUNTER — Ambulatory Visit
Admission: EM | Admit: 2023-02-25 | Discharge: 2023-02-25 | Disposition: A | Discharge status: Home / Self Care | Payer: No Typology Code available for payment source | Attending: Family Medicine | Admitting: Family Medicine

## 2023-02-25 ENCOUNTER — Other Ambulatory Visit: Payer: Self-pay

## 2023-02-25 ENCOUNTER — Encounter: Payer: Self-pay | Admitting: Emergency Medicine

## 2023-02-25 DIAGNOSIS — R350 Frequency of micturition: Secondary | ICD-10-CM

## 2023-02-25 DIAGNOSIS — R300 Dysuria: Secondary | ICD-10-CM

## 2023-02-25 LAB — POCT URINALYSIS DIP (MANUAL ENTRY)
Bilirubin, UA: NEGATIVE
Blood, UA: NEGATIVE
Glucose, UA: NEGATIVE mg/dL
Ketones, POC UA: NEGATIVE mg/dL
Leukocytes, UA: NEGATIVE
Nitrite, UA: NEGATIVE
Protein Ur, POC: NEGATIVE mg/dL
Spec Grav, UA: 1.015 (ref 1.010–1.025)
Urobilinogen, UA: 0.2 U/dL
pH, UA: 7 (ref 5.0–8.0)

## 2023-02-25 NOTE — ED Provider Notes (Signed)
[facility]    CSN: [id number] Arrival date & time: 02/25/23  1617      History   Chief Complaint Chief Complaint  Patient presents with   Urinary Tract Infection    HPI Charlene Carey is a 47 y.o. female.   HPI 47 year old female presents with dysuria for 3 days.  Reports being evaluated on Tuesday, 02/18/2023 was treated with Macrobid x 5 days.  Urine culture revealed large colony count of E. coli and prescribed antibiotic was not resistant to this urinary pathogen. PMH significant for a ascending aortic aneurysm, multinodular goiter, and frequent headaches.  Past Medical History:  Diagnosis Date   Allergy-induced asthma    Aortic stenosis with bicuspid valve    Arthritis    Ascending aortic aneurysm (HCC)    Frequent headaches    Heartburn    History of chicken pox    History of gestational diabetes    Hyperglycemia    Multinodular goiter    Seasonal allergies    Subclinical hyperthyroidism     Patient Active Problem List   Diagnosis Date Noted   Hyperlipidemia 11/27/2022   Lumbar radiculopathy 11/06/2022   Positive ANA (antinuclear antibody) 11/06/2022   Elevated blood pressure reading 10/07/2022   Generalized weakness 10/07/2022   Preventative health care 01/31/2021   Subclinical hyperthyroidism 12/21/2020   Hyperglycemia 12/21/2020   Irregular menses 12/20/2020   Paresthesia 12/20/2020   Vitamin D deficiency 12/20/2020   Carpal tunnel syndrome of left wrist 12/20/2020   Anxiety 12/20/2020   Ascending aortic aneurysm (HCC)    Multinodular goiter    Atypical chest pain 10/16/2016   Dyspnea on exertion 10/16/2016   Aortic stenosis due to bicuspid aortic valve     Past Surgical History:  Procedure Laterality Date   BREAST REDUCTION SURGERY     OTHER SURGICAL HISTORY     had d& c   RIGHT/LEFT HEART CATH AND CORONARY ANGIOGRAPHY N/A 11/18/2016   Procedure: Right/Left Heart Cath and Coronary Angiography;  Surgeon: Runell Gess, MD;   Location: [facility] INVASIVE CV LAB;  Service: Cardiovascular;  Laterality: N/A;   TEE WITHOUT CARDIOVERSION N/A 01/27/2017   Procedure: TRANSESOPHAGEAL ECHOCARDIOGRAM (TEE);  Surgeon: Thurmon Fair, MD;  Location: [facility]OS[facility];  Service: Cardiovascular;  Laterality: N/A;    OB History     Gravida  5   Para  3   Term      Preterm      AB      Living  3      SAB      IAB      Ectopic      Multiple      Live Births  3            Home Medications    Prior to Admission medications   Medication Sig Start Date End Date Taking? Authorizing Provider  Cyanocobalamin (VITAMIN B-12) 1000 MCG SUBL Take 1 tablet by mouth daily.    [provider]  nitrofurantoin, macrocrystal-monohydrate, (MACROBID) 100 MG capsule Take 1 capsule (100 mg total) by mouth 2 (two) times daily. Patient not taking: Reported on 02/25/2023 02/18/23   Eustace Moore, MD  phenazopyridine (PYRIDIUM) 200 MG tablet Take 1 tablet (200 mg total) by mouth 3 (three) times daily. Patient not taking: Reported on 02/25/2023 02/18/23   Eustace Moore, MD  Vitamin D, Ergocalciferol, (DRISDOL) 1.25 MG (50000 UNIT) CAPS capsule Take 50,000 Units by mouth every 7 (seven) days.    [provider]    Family History Family History  Problem Relation Age of Onset   Hypertension Mother    Thyroid disease Mother        Hypothyroid   Hyperlipidemia Mother    Cancer Father    Diabetes Father    CAD Father    Alcohol abuse Brother    Breast cancer Maternal Aunt        onset at 11   Colon cancer Maternal Uncle        under 50 at onset   Lung cancer Maternal Uncle    Breast cancer Paternal Aunt        unsure of onset age   Endometrial cancer Maternal Grandmother    Hypertension Maternal Grandfather    Stroke Maternal Grandfather    Hyperlipidemia Maternal Grandfather    Bipolar disorder Daughter        patient reports daughter does not have bipolar disorder   Breast cancer Other        breast  cancer    Social History Social History   Tobacco Use   Smoking status: Former    Types: Cigarettes   Smokeless tobacco: Never   Tobacco comments:    Smoked in college occasionally  Vaping Use   Vaping status: Never Used  Substance Use Topics   Alcohol use: Yes    Comment: rarely   Drug use: No     Allergies   Cat hair extract, Codeine, and Dust mite extract   Review of Systems Review of Systems  Genitourinary:  Positive for dysuria and frequency.  All other systems reviewed and are negative.    Physical Exam Triage Vital Signs ED Triage Vitals  Encounter Vitals Group     BP      Systolic BP Percentile      Diastolic BP Percentile      Pulse      Resp      Temp      Temp src      SpO2      Weight      Height      Head Circumference      Peak Flow      Pain Score      Pain Loc      Pain Education      Exclude from Growth Chart    No data found.  Updated Vital Signs BP 120/84 (BP Location: Left Arm)   Pulse 89   Temp 98.2 F (36.8 C) (Oral)   Resp 18   LMP  (LMP Unknown)   SpO2 97%     Physical Exam Vitals and nursing note reviewed.  Constitutional:      Appearance: Normal appearance. She is normal weight.  HENT:     Head: Normocephalic and atraumatic.     Mouth/Throat:     Mouth: Mucous membranes are moist.     Pharynx: Oropharynx is clear.  Eyes:     Extraocular Movements: Extraocular movements intact.     Conjunctiva/sclera: Conjunctivae normal.     Pupils: Pupils are equal, round, and reactive to light.  Cardiovascular:     Rate and Rhythm: Normal rate and regular rhythm.     Pulses: Normal pulses.     Heart sounds: Normal heart sounds.  Pulmonary:     Effort: Pulmonary effort is normal.     Breath sounds: Normal breath sounds. No wheezing, rhonchi or rales.  Musculoskeletal:        General: Normal range  of motion.     Cervical back: Normal range of motion and neck supple.  Skin:    General: Skin is warm and dry.   Neurological:     General: No focal deficit present.     Mental Status: She is alert and oriented to person, place, and time. Mental status is at baseline.  Psychiatric:        Mood and Affect: Mood normal.        Behavior: Behavior normal.      UC Treatments / Results  Labs (all labs ordered are listed, but only abnormal results are displayed) Labs Reviewed  POCT URINALYSIS DIP (MANUAL ENTRY)    EKG   Radiology No results found.  Procedures Procedures (including critical care time)  Medications Ordered in UC Medications - No data to display  Initial Impression / Assessment and Plan / UC Course  I have reviewed the triage vital signs and the nursing notes.  Pertinent labs & imaging results that were available during my care of the patient were reviewed by me and considered in my medical decision making (see chart for details).     MDM: 1.  Dysuria-UA revealed above completely unremarkable unable to order urine culture; 2.  Urinary frequency-UA revealed above, completely unremarkable. Advised patient that UA was unremarkable and urine culture would not be necessary at this point.  Encouraged to increase daily water intake to 64 ounces per day 7 days/week.  Advised if symptoms worsen and/or unresolved please follow-up PCP, urology, or here for further evaluation.  Final Clinical Impressions(s) / UC Diagnoses   Final diagnoses:  Dysuria  Urinary frequency     Discharge Instructions      Advised patient that UA was unremarkable and urine culture would not be necessary at this point.  Encouraged to increase daily water intake to 64 ounces per day 7 days/week.  Advised if symptoms worsen and/or unresolved please follow-up PCP, urology, or here for further evaluation.     ED Prescriptions   None    PDMP not reviewed this encounter.   Trevor Iha, FNP 02/25/23 1735

## 2023-02-25 NOTE — Discharge Instructions (Addendum)
Advised patient that UA was unremarkable and urine culture would not be necessary at this point.  Encouraged to increase daily water intake to 64 ounces per day 7 days/week.  Advised if symptoms worsen and/or unresolved please follow-up PCP, urology, or here for further evaluation.

## 2023-02-25 NOTE — ED Triage Notes (Addendum)
Seen 02/18/2023.  Diagnosed with bladder infection.  Patient was feeling much better, but had a slight feeling that it wasn't gone completely.  Still feeling bladder cramping and a cramp sensation at end of urinary stream.    Not taken any other medicines

## 2023-03-20 ENCOUNTER — Encounter: Payer: No Typology Code available for payment source | Admitting: Internal Medicine

## 2023-03-26 ENCOUNTER — Telehealth: Payer: Self-pay | Admitting: *Deleted

## 2023-03-26 NOTE — Telephone Encounter (Signed)
Spoke with patient. Tx for UTI by urgent care 9/10 and 9/17. Reports watery, clear to yellow vaginal discharge for 1 week. Intermittent cramping since being treated for UTI. Denies vaginal itching, bleeding or odor. Denies urinary symptoms.   OV scheduled for 10/17 at 1500 with JC.  Last AEX 11/07/22.   Routing to provider for final review. Patient is agreeable to disposition. Will close encounter.

## 2023-03-27 ENCOUNTER — Encounter: Payer: Self-pay | Admitting: Radiology

## 2023-03-27 ENCOUNTER — Ambulatory Visit: Payer: No Typology Code available for payment source | Admitting: Radiology

## 2023-03-27 VITALS — BP 116/82

## 2023-03-27 DIAGNOSIS — N952 Postmenopausal atrophic vaginitis: Secondary | ICD-10-CM | POA: Diagnosis not present

## 2023-03-27 LAB — WET PREP FOR TRICH, YEAST, CLUE

## 2023-03-27 MED ORDER — ESTRADIOL 10 MCG VA TABS: 1.0000 | ORAL_TABLET | VAGINAL | 11 refills | Status: DC

## 2023-03-27 NOTE — Progress Notes (Signed)
      Subjective: Charlene Carey is a 47 y.o. female who complains of vaginal discharge, irritation x 1 week. No itching or odor. Recently treated for a UTI.   Review of Systems  All other systems reviewed and are negative.   Past Medical History:  Diagnosis Date   Allergy-induced asthma    Aortic stenosis with bicuspid valve    Arthritis    Ascending aortic aneurysm (HCC)    Frequent headaches    Heartburn    History of chicken pox    History of gestational diabetes    Hyperglycemia    Multinodular goiter    Seasonal allergies    Subclinical hyperthyroidism       Objective:  Today's Vitals   03/27/23 1512  BP: 116/82   There is no height or weight on file to calculate BMI.   Physical Exam Vitals and nursing note reviewed. Exam conducted with a chaperone present.  Constitutional:      Appearance: Normal appearance. She is well-developed.  Pulmonary:     Effort: Pulmonary effort is normal.  Abdominal:     General: Abdomen is flat.     Palpations: Abdomen is soft.  Genitourinary:    General: Normal vulva.     Vagina: Vaginal discharge present. No erythema, bleeding or lesions.     Cervix: Normal. No discharge, friability, lesion or erythema.     Uterus: Normal.      Adnexa: Right adnexa normal and left adnexa normal.  Neurological:     Mental Status: She is alert.  Psychiatric:        Mood and Affect: Mood normal.        Thought Content: Thought content normal.        Judgment: Judgment normal.    Microscopic wet-mount exam shows negative for pathogens, normal epithelial cells.   Raynelle Fanning, CMA present for exam  Assessment:/Plan:   1. Atrophic vaginitis Begin yuvafem twice weekly to manage symptoms - Estradiol 10 MCG TABS vaginal tablet; Place 1 tablet (10 mcg total) vaginally 2 (two) times a week.  Dispense: 8 tablet; Refill: 11 - WET PREP FOR TRICH, YEAST, CLUE; reassured negative    Ajay Strubel B, NP 3:24 PM

## 2023-04-07 ENCOUNTER — Ambulatory Visit
Admission: EM | Admit: 2023-04-07 | Discharge: 2023-04-07 | Disposition: A | Discharge status: Home / Self Care | Payer: No Typology Code available for payment source | Attending: Family Medicine | Admitting: Family Medicine

## 2023-04-07 ENCOUNTER — Encounter: Payer: Self-pay | Admitting: *Deleted

## 2023-04-07 ENCOUNTER — Other Ambulatory Visit: Payer: Self-pay

## 2023-04-07 DIAGNOSIS — R300 Dysuria: Secondary | ICD-10-CM | POA: Diagnosis not present

## 2023-04-07 DIAGNOSIS — I350 Nonrheumatic aortic (valve) stenosis: Secondary | ICD-10-CM | POA: Insufficient documentation

## 2023-04-07 DIAGNOSIS — N3001 Acute cystitis with hematuria: Secondary | ICD-10-CM | POA: Diagnosis not present

## 2023-04-07 LAB — POCT URINALYSIS DIP (MANUAL ENTRY)
Bilirubin, UA: NEGATIVE
Glucose, UA: NEGATIVE mg/dL
Ketones, POC UA: NEGATIVE mg/dL
Nitrite, UA: POSITIVE — AB
Protein Ur, POC: 100 mg/dL — AB
Spec Grav, UA: >=1.03 — AB (ref 1.010–1.025)
Urobilinogen, UA: 0.2 U/dL
pH, UA: 5.5 (ref 5.0–8.0)

## 2023-04-07 MED ORDER — CEFDINIR 300 MG PO CAPS: 300.0000 mg | ORAL_CAPSULE | Freq: Two times a day (BID) | ORAL | 0 refills | Status: AC

## 2023-04-07 NOTE — ED Provider Notes (Signed)
[facility]    CSN: [id number] Arrival date & time: 04/07/23  1313      History   Chief Complaint Chief Complaint  Patient presents with   Dysuria    HPI Charlene Carey is a 47 y.o. female.   HPI Pleasant 47 year old female presents with dysuria PMH significant for aortic stenosis with bicuspid valve, frequent headaches, and subclinical hyperthyroidism.  Past Medical History:  Diagnosis Date   Allergy-induced asthma    Aortic stenosis with bicuspid valve    Arthritis    Ascending aortic aneurysm (HCC)    Frequent headaches    Heartburn    History of chicken pox    History of gestational diabetes    Hyperglycemia    Multinodular goiter    Seasonal allergies    Subclinical hyperthyroidism     Patient Active Problem List   Diagnosis Date Noted   Hyperlipidemia 11/27/2022   Lumbar radiculopathy 11/06/2022   Positive ANA (antinuclear antibody) 11/06/2022   Elevated blood pressure reading 10/07/2022   Generalized weakness 10/07/2022   Preventative health care 01/31/2021   Subclinical hyperthyroidism 12/21/2020   Hyperglycemia 12/21/2020   Irregular menses 12/20/2020   Paresthesia 12/20/2020   Vitamin D deficiency 12/20/2020   Carpal tunnel syndrome of left wrist 12/20/2020   Anxiety 12/20/2020   Ascending aortic aneurysm (HCC)    Multinodular goiter    Atypical chest pain 10/16/2016   Dyspnea on exertion 10/16/2016   Aortic stenosis due to bicuspid aortic valve     Past Surgical History:  Procedure Laterality Date   BREAST REDUCTION SURGERY     OTHER SURGICAL HISTORY     had d& c   RIGHT/LEFT HEART CATH AND CORONARY ANGIOGRAPHY N/A 11/18/2016   Procedure: Right/Left Heart Cath and Coronary Angiography;  Surgeon: Runell Gess, MD;  Location: [facility] INVASIVE CV LAB;  Service: Cardiovascular;  Laterality: N/A;   TEE WITHOUT CARDIOVERSION N/A 01/27/2017   Procedure: TRANSESOPHAGEAL ECHOCARDIOGRAM (TEE);  Surgeon: Thurmon Fair, MD;  Location:  [facility];  Service: Cardiovascular;  Laterality: N/A;    OB History     Gravida  5   Para  3   Term      Preterm      AB      Living  3      SAB      IAB      Ectopic      Multiple      Live Births  3            Home Medications    Prior to Admission medications   Medication Sig Start Date End Date Taking? Authorizing Provider  cefdinir (OMNICEF) 300 MG capsule Take 1 capsule (300 mg total) by mouth 2 (two) times daily for 7 days. 04/07/23 04/14/23 Yes Trevor Iha, FNP  Cyanocobalamin (VITAMIN B-12) 1000 MCG SUBL Take 1 tablet by mouth daily.   Yes [provider]  Multiple Vitamin (MULTIVITAMIN PO) Take by mouth.   Yes [provider]  Probiotic Product (PROBIOTIC PO) Take by mouth.   Yes [provider]  Vitamin D, Ergocalciferol, (DRISDOL) 1.25 MG (50000 UNIT) CAPS capsule Take 50,000 Units by mouth every 7 (seven) days.   Yes [provider]  Estradiol 10 MCG TABS vaginal tablet Place 1 tablet (10 mcg total) vaginally 2 (two) times a week. 03/27/23   ChrzanowskiLamona Curl, NP    Family History Family History  Problem Relation Age of Onset   Hypertension Mother  Thyroid disease Mother        Hypothyroid   Hyperlipidemia Mother    Cancer Father    Diabetes Father    CAD Father    Alcohol abuse Brother    Breast cancer Maternal Aunt        onset at 94   Colon cancer Maternal Uncle        under 50 at onset   Lung cancer Maternal Uncle    Breast cancer Paternal Aunt        unsure of onset age   Endometrial cancer Maternal Grandmother    Hypertension Maternal Grandfather    Stroke Maternal Grandfather    Hyperlipidemia Maternal Grandfather    Bipolar disorder Daughter        patient reports daughter does not have bipolar disorder   Breast cancer Other        breast cancer    Social History Social History   Tobacco Use   Smoking status: Former    Types: Cigarettes   Smokeless tobacco: Never    Tobacco comments:    Smoked in college occasionally  Vaping Use   Vaping status: Never Used  Substance Use Topics   Alcohol use: Yes    Comment: rarely   Drug use: No     Allergies   Cat hair extract, Codeine, and Dust mite extract   Review of Systems Review of Systems  Genitourinary:  Positive for dysuria.  All other systems reviewed and are negative.    Physical Exam Triage Vital Signs ED Triage Vitals  Encounter Vitals Group     BP 04/07/23 1415 128/85     Systolic BP Percentile --      Diastolic BP Percentile --      Pulse Rate 04/07/23 1415 77     Resp 04/07/23 1415 18     Temp 04/07/23 1415 98.1 F (36.7 C)     Temp src --      SpO2 04/07/23 1415 98 %     Weight --      Height --      Head Circumference --      Peak Flow --      Pain Score 04/07/23 1413 4     Pain Loc --      Pain Education --      Exclude from Growth Chart --    No data found.  Updated Vital Signs BP 128/85   Pulse 77   Temp 98.1 F (36.7 C)   Resp 18   LMP  (LMP Unknown)   SpO2 98%    Physical Exam Vitals and nursing note reviewed.  Constitutional:      Appearance: Normal appearance. She is normal weight.  HENT:     Head: Normocephalic and atraumatic.     Mouth/Throat:     Mouth: Mucous membranes are moist.     Pharynx: Oropharynx is clear.  Eyes:     Extraocular Movements: Extraocular movements intact.     Conjunctiva/sclera: Conjunctivae normal.     Pupils: Pupils are equal, round, and reactive to light.  Cardiovascular:     Rate and Rhythm: Normal rate and regular rhythm.     Pulses: Normal pulses.     Heart sounds: Normal heart sounds.  Pulmonary:     Effort: Pulmonary effort is normal.     Breath sounds: Normal breath sounds. No wheezing, rhonchi or rales.  Musculoskeletal:        General: Normal range of motion.  Cervical back: Normal range of motion and neck supple.  Skin:    General: Skin is warm and dry.  Neurological:     General: No focal deficit  present.     Mental Status: She is alert and oriented to person, place, and time. Mental status is at baseline.  Psychiatric:        Mood and Affect: Mood normal.        Behavior: Behavior normal.        Thought Content: Thought content normal.      UC Treatments / Results  Labs (all labs ordered are listed, but only abnormal results are displayed) Labs Reviewed  POCT URINALYSIS DIP (MANUAL ENTRY) - Abnormal; Notable for the following components:      Result Value   Clarity, UA cloudy (*)    Spec Grav, UA >=1.030 (*)    Blood, UA large (*)    Protein Ur, POC =100 (*)    Nitrite, UA Positive (*)    Leukocytes, UA Large (3+) (*)    All other components within normal limits  URINE CULTURE    EKG   Radiology No results found.  Procedures Procedures (including critical care time)  Medications Ordered in UC Medications - No data to display  Initial Impression / Assessment and Plan / UC Course  I have reviewed the triage vital signs and the nursing notes.  Pertinent labs & imaging results that were available during my care of the patient were reviewed by me and considered in my medical decision making (see chart for details).     MDM: 1.  Acute cystitis with hematuria-UA revealed above, urine culture ordered, Rx'd Cefdinir 300 mg capsule: Take 1 capsule twice daily x 7 days;  2.  Dysuria-UA revealed above, urine culture ordered, Rx'd Cefdinir 300 mg capsule: Take 1 capsule twice daily x 7 days. Advised patient to take medication as directed with food to completion.  Encouraged to increase daily water intake to 64 ounces per day while taking this medication.  Advised we will follow-up with urine culture results once received.  Advised if symptoms worsen and/or unresolved please follow-up with PCP or here for further evaluation.  Patient discharged home, hemodynamically stable. Final Clinical Impressions(s) / UC Diagnoses   Final diagnoses:  Dysuria  Acute cystitis with  hematuria     Discharge Instructions      Advised patient to take medication as directed with food to completion.  Encouraged to increase daily water intake to 64 ounces per day while taking this medication.  Advised we will follow-up with urine culture results once received.  Advised if symptoms worsen and/or unresolved please follow-up with PCP or here for further evaluation.     ED Prescriptions     Medication Sig Dispense Auth. Provider   cefdinir (OMNICEF) 300 MG capsule Take 1 capsule (300 mg total) by mouth 2 (two) times daily for 7 days. 14 capsule Trevor Iha, FNP      PDMP not reviewed this encounter.   Trevor Iha, FNP 04/07/23 1453

## 2023-04-07 NOTE — Discharge Instructions (Addendum)
Advised patient to take medication as directed with food to completion.  Encouraged to increase daily water intake to 64 ounces per day while taking this medication.  Advised we will follow-up with urine culture results once received.  Advised if symptoms worsen and/or unresolved please follow-up with PCP or here for further evaluation.

## 2023-04-07 NOTE — ED Triage Notes (Signed)
Pt reports she woke up with dysuria today.

## 2023-04-10 LAB — URINE CULTURE: Culture: >=100000 — AB

## 2023-04-27 ENCOUNTER — Other Ambulatory Visit: Payer: Self-pay

## 2023-04-27 ENCOUNTER — Ambulatory Visit
Admission: RE | Admit: 2023-04-27 | Discharge: 2023-04-27 | Disposition: A | Discharge status: Home / Self Care | Payer: No Typology Code available for payment source | Source: Ambulatory Visit

## 2023-04-27 VITALS — BP 134/97 | HR 76 | Temp 97.5°F | Resp 18

## 2023-04-27 DIAGNOSIS — N3001 Acute cystitis with hematuria: Secondary | ICD-10-CM | POA: Diagnosis not present

## 2023-04-27 DIAGNOSIS — R300 Dysuria: Secondary | ICD-10-CM | POA: Diagnosis not present

## 2023-04-27 LAB — POCT URINALYSIS DIP (MANUAL ENTRY)
Bilirubin, UA: NEGATIVE
Glucose, UA: 100 mg/dL — AB
Ketones, POC UA: NEGATIVE mg/dL
Nitrite, UA: POSITIVE — AB
Protein Ur, POC: 30 mg/dL — AB
Spec Grav, UA: 1.01 (ref 1.010–1.025)
Urobilinogen, UA: 1 U/dL
pH, UA: 5 (ref 5.0–8.0)

## 2023-04-27 MED ORDER — PHENAZOPYRIDINE HCL 200 MG PO TABS: 200.0000 mg | ORAL_TABLET | Freq: Three times a day (TID) | ORAL | 0 refills | Status: DC

## 2023-04-27 MED ORDER — SULFAMETHOXAZOLE-TRIMETHOPRIM 800-160 MG PO TABS: 1.0000 | ORAL_TABLET | Freq: Two times a day (BID) | ORAL | 0 refills | Status: AC

## 2023-04-27 NOTE — ED Triage Notes (Addendum)
Patient presents to Urgent Care with complaints of urine urgency, flank pain, cramping, dysuria since 1 day ago. Patient reports reoccurring urinary tract infections. She did take Azo to help. Patient is very concerned about the frequency of UTI's reoccurring. Wanting further work up to determine the cause. Has been having left flank pain since Feb. Has increased water intake, taking probiotics, cranberry supplements.

## 2023-04-27 NOTE — ED Provider Notes (Signed)
[facility]    CSN: [id number] Arrival date & time: 04/27/23  1416      History   Chief Complaint Chief Complaint  Patient presents with   Dysuria    Entered by patient   Flank Pain    HPI Charlene Carey is a 47 y.o. female.   HPI Patient presents with left flank pain, abdominal cramping and dysuria.  Patient is concerned with recurrent urinary tract infections.  Patient evaluated here on 04/07/2023 found to have urinary tract infection.  PMH significant for frequent headaches, aortic stenosis with bicuspid valve, and multinodular goiter.  Past Medical History:  Diagnosis Date   Allergy-induced asthma    Aortic stenosis with bicuspid valve    Arthritis    Ascending aortic aneurysm (HCC)    Frequent headaches    Heartburn    History of chicken pox    History of gestational diabetes    Hyperglycemia    Multinodular goiter    Seasonal allergies    Subclinical hyperthyroidism     Patient Active Problem List   Diagnosis Date Noted   Hyperlipidemia 11/27/2022   Lumbar radiculopathy 11/06/2022   Positive ANA (antinuclear antibody) 11/06/2022   Elevated blood pressure reading 10/07/2022   Generalized weakness 10/07/2022   Preventative health care 01/31/2021   Subclinical hyperthyroidism 12/21/2020   Hyperglycemia 12/21/2020   Irregular menses 12/20/2020   Paresthesia 12/20/2020   Vitamin D deficiency 12/20/2020   Carpal tunnel syndrome of left wrist 12/20/2020   Anxiety 12/20/2020   Ascending aortic aneurysm (HCC)    Multinodular goiter    Atypical chest pain 10/16/2016   Dyspnea on exertion 10/16/2016   Aortic stenosis due to bicuspid aortic valve     Past Surgical History:  Procedure Laterality Date   BREAST REDUCTION SURGERY     OTHER SURGICAL HISTORY     had d& c   RIGHT/LEFT HEART CATH AND CORONARY ANGIOGRAPHY N/A 11/18/2016   Procedure: Right/Left Heart Cath and Coronary Angiography;  Surgeon: Runell Gess, MD;  Location: [facility]  INVASIVE CV LAB;  Service: Cardiovascular;  Laterality: N/A;   TEE WITHOUT CARDIOVERSION N/A 01/27/2017   Procedure: TRANSESOPHAGEAL ECHOCARDIOGRAM (TEE);  Surgeon: Thurmon Fair, MD;  Location: [facility];  Service: Cardiovascular;  Laterality: N/A;    OB History     Gravida  5   Para  3   Term      Preterm      AB      Living  3      SAB      IAB      Ectopic      Multiple      Live Births  3            Home Medications    Prior to Admission medications   Medication Sig Start Date End Date Taking? Authorizing Provider  Cranberry 125 MG TABS Take by mouth.   Yes [provider]  Cyanocobalamin (VITAMIN B-12) 1000 MCG SUBL Take 1 tablet by mouth daily.   Yes [provider]  Multiple Vitamin (MULTIVITAMIN PO) Take by mouth.   Yes [provider]  phenazopyridine (PYRIDIUM) 200 MG tablet Take 1 tablet (200 mg total) by mouth 3 (three) times daily. 04/27/23  Yes Trevor Iha, FNP  Probiotic Product (PROBIOTIC PO) Take by mouth.   Yes [provider]  sulfamethoxazole-trimethoprim (BACTRIM DS) 800-160 MG tablet Take 1 tablet by mouth 2 (two) times daily for 7 days. 04/27/23 05/04/23 Yes  Trevor Iha, FNP  Vitamin D, Ergocalciferol, (DRISDOL) 1.25 MG (50000 UNIT) CAPS capsule Take 50,000 Units by mouth every 7 (seven) days.   Yes [provider]  Estradiol 10 MCG TABS vaginal tablet Place 1 tablet (10 mcg total) vaginally 2 (two) times a week. 03/27/23   Chrzanowski, Lamona Curl, NP    Family History Family History  Problem Relation Age of Onset   Hypertension Mother    Thyroid disease Mother        Hypothyroid   Hyperlipidemia Mother    Cancer Father    Diabetes Father    CAD Father    Alcohol abuse Brother    Breast cancer Maternal Aunt        onset at 91   Colon cancer Maternal Uncle        under 50 at onset   Lung cancer Maternal Uncle    Breast cancer Paternal Aunt        unsure of onset age    Endometrial cancer Maternal Grandmother    Hypertension Maternal Grandfather    Stroke Maternal Grandfather    Hyperlipidemia Maternal Grandfather    Bipolar disorder Daughter        patient reports daughter does not have bipolar disorder   Breast cancer Other        breast cancer    Social History Social History   Tobacco Use   Smoking status: Former    Types: Cigarettes   Smokeless tobacco: Never   Tobacco comments:    Smoked in college occasionally  Vaping Use   Vaping status: Never Used  Substance Use Topics   Alcohol use: Yes    Comment: rarely   Drug use: No     Allergies   Cat hair extract, Codeine, and Dust mite extract   Review of Systems Review of Systems  Genitourinary:  Positive for dysuria.  All other systems reviewed and are negative.    Physical Exam Triage Vital Signs ED Triage Vitals  Encounter Vitals Group     BP      Systolic BP Percentile      Diastolic BP Percentile      Pulse      Resp      Temp      Temp src      SpO2      Weight      Height      Head Circumference      Peak Flow      Pain Score      Pain Loc      Pain Education      Exclude from Growth Chart    No data found.  Updated Vital Signs BP (!) 134/97 (BP Location: Right Arm)   Pulse 76   Temp (!) 97.5 F (36.4 C) (Oral)   Resp 18   LMP  (LMP Unknown)   SpO2 100%   Physical Exam Vitals and nursing note reviewed.  Constitutional:      Appearance: Normal appearance. She is normal weight.  HENT:     Head: Normocephalic and atraumatic.     Mouth/Throat:     Mouth: Mucous membranes are moist.     Pharynx: Oropharynx is clear.  Eyes:     Extraocular Movements: Extraocular movements intact.     Conjunctiva/sclera: Conjunctivae normal.     Pupils: Pupils are equal, round, and reactive to light.  Cardiovascular:     Rate and Rhythm: Normal rate and regular rhythm.  Pulses: Normal pulses.     Heart sounds: Normal heart sounds.  Pulmonary:     Effort:  Pulmonary effort is normal.     Breath sounds: Normal breath sounds. No wheezing, rhonchi or rales.  Musculoskeletal:        General: Normal range of motion.     Cervical back: Normal range of motion and neck supple.  Skin:    General: Skin is warm and dry.  Neurological:     General: No focal deficit present.     Mental Status: She is alert and oriented to person, place, and time. Mental status is at baseline.  Psychiatric:        Mood and Affect: Mood normal.        Behavior: Behavior normal.      UC Treatments / Results  Labs (all labs ordered are listed, but only abnormal results are displayed) Labs Reviewed  POCT URINALYSIS DIP (MANUAL ENTRY) - Abnormal; Notable for the following components:      Result Value   Color, UA orange (*)    Clarity, UA cloudy (*)    Glucose, UA =100 (*)    Blood, UA small (*)    Protein Ur, POC =30 (*)    Nitrite, UA Positive (*)    Leukocytes, UA Large (3+) (*)    All other components within normal limits    EKG   Radiology No results found.  Procedures Procedures (including critical care time)  Medications Ordered in UC Medications - No data to display  Initial Impression / Assessment and Plan / UC Course  I have reviewed the triage vital signs and the nursing notes.  Pertinent labs & imaging results that were available during my care of the patient were reviewed by me and considered in my medical decision making (see chart for details).     MDM: 1.  Acute cystitis with hematuria-Rx'd Bactrim 800/160 mg DS tablet: Take 1 tablet twice daily x 7 days; 2. Dysuria-Rx'd Pyridium 200 mg tablet: Take 1 tablet by mouth 3 times daily, as needed for dysuria. Advised patient to take medications as directed with food to completion.  Advised may take Pyridium in addition to Bactrim for dysuria.  Encouraged to increase daily water intake to 64 ounces per day while taking these medications.  Advised we will follow-up with urine culture results  once received.  Advised patient if symptoms worsen and/or unresolved please follow-up with [facility] urology for further evaluation.  Contact information provided with his AVS today.  Patient discharged home, hemodynamically stable. Final Clinical Impressions(s) / UC Diagnoses   Final diagnoses:  Dysuria  Acute cystitis with hematuria     Discharge Instructions      Advised patient to take medications as directed with food to completion.  Advised may take Pyridium in addition to Bactrim for dysuria.  Encouraged to increase daily water intake to 64 ounces per day while taking these medications.  Advised we will follow-up with urine culture results once received.  Advised patient if symptoms worsen and/or unresolved please follow-up with [facility] health urology for further evaluation.  Contact information provided with his AVS today.     ED Prescriptions     Medication Sig Dispense Auth. Provider   sulfamethoxazole-trimethoprim (BACTRIM DS) 800-160 MG tablet Take 1 tablet by mouth 2 (two) times daily for 7 days. 14 tablet Trevor Iha, FNP   phenazopyridine (PYRIDIUM) 200 MG tablet Take 1 tablet (200 mg total) by mouth 3 (three) times daily. 15  tablet Trevor Iha, FNP      PDMP not reviewed this encounter.   Trevor Iha, FNP 04/27/23 1531

## 2023-04-27 NOTE — Discharge Instructions (Addendum)
Advised patient to take medications as directed with food to completion.  Advised may take Pyridium in addition to Bactrim for dysuria.  Encouraged to increase daily water intake to 64 ounces per day while taking these medications.  Advised we will follow-up with urine culture results once received.  Advised patient if symptoms worsen and/or unresolved please follow-up with [facility] urology for further evaluation.  Contact information provided with his AVS today.

## 2023-05-01 ENCOUNTER — Encounter: Payer: No Typology Code available for payment source | Admitting: Urology

## 2023-05-07 ENCOUNTER — Ambulatory Visit: Payer: No Typology Code available for payment source | Admitting: Urology

## 2023-05-07 ENCOUNTER — Encounter: Payer: Self-pay | Admitting: Urology

## 2023-05-07 VITALS — BP 132/88 | HR 101 | Ht 66.0 in | Wt 167.0 lb

## 2023-05-07 DIAGNOSIS — N390 Urinary tract infection, site not specified: Secondary | ICD-10-CM | POA: Insufficient documentation

## 2023-05-07 DIAGNOSIS — Z87440 Personal history of urinary (tract) infections: Secondary | ICD-10-CM | POA: Diagnosis not present

## 2023-05-07 DIAGNOSIS — Z09 Encounter for follow-up examination after completed treatment for conditions other than malignant neoplasm: Secondary | ICD-10-CM | POA: Diagnosis not present

## 2023-05-07 LAB — URINALYSIS, ROUTINE W REFLEX MICROSCOPIC
Bilirubin, UA: NEGATIVE
Glucose, UA: NEGATIVE
Ketones, UA: NEGATIVE
Leukocytes,UA: NEGATIVE
Nitrite, UA: NEGATIVE
Protein,UA: NEGATIVE
RBC, UA: NEGATIVE
Specific Gravity, UA: 1.025 (ref 1.005–1.030)
Urobilinogen, Ur: 0.2 mg/dL (ref 0.2–1.0)
pH, UA: 5.5 (ref 5.0–7.5)

## 2023-05-07 LAB — BLADDER SCAN AMB NON-IMAGING

## 2023-05-07 MED ORDER — NITROFURANTOIN MONOHYD MACRO 100 MG PO CAPS: 100.0000 mg | ORAL_CAPSULE | Freq: Every day | ORAL | 2 refills | Status: DC

## 2023-05-07 NOTE — Progress Notes (Signed)
Assessment: 1. Frequent UTI     Plan: I personally reviewed the patient's chart including provider notes, and lab results. Methods to reduce the risk of UTIs discussed with the patient including increase fluid intake, timed and double voiding, daily cranberry supplement, daily probiotic, and vaginal hormone replacement. She is already doing the majority of these. Renal ultrasound for evaluation of the upper tracts. Begin macrobid 100 mg daily for UTI prevention.  Rx sent. Return to office in 1 month  Chief Complaint:  Chief Complaint  Patient presents with   Frequent UTI    History of Present Illness:  Charlene Carey is a 47 y.o. female who is seen in consultation from Sandford Craze, NP for evaluation of UTI. She has been treated for 3 UTIs since September 2024.  No history of UTIs prior to this.  She had onset of some bladder discomfort in mid September 2024.  Her symptoms came on fairly quickly.  She reported some slight dysuria.  No gross hematuria or flank pain.  No fevers or chills.  Urine culture results: 02/21/23 >100K E. Coli - treated with Macrobid x 5 days 04/10/23 >100K E. Coli - treated with Cefdinir x 7 days  U/A from 04/27/23 for UTI symptoms showed large LE, + nitrite. No culture obtained.  Treated with Bactrim x 7 days.  Her symptoms resolved after antibiotic therapy.  She is not having any UTI symptoms today.  She completed the Bactrim approximately 3 days ago. No history of kidney stones.  No recent imaging. She does have baseline symptoms of frequency, nocturia, and occasional urgency.  She was given a prescription for estradiol vaginal tablets but has not started yet.   Past Medical History:  Past Medical History:  Diagnosis Date   Allergy-induced asthma    Aortic stenosis with bicuspid valve    Arthritis    Ascending aortic aneurysm (HCC)    Frequent headaches    Heartburn    History of chicken pox    History of gestational diabetes     Hyperglycemia    Multinodular goiter    Seasonal allergies    Subclinical hyperthyroidism     Past Surgical History:  Past Surgical History:  Procedure Laterality Date   BREAST REDUCTION SURGERY     OTHER SURGICAL HISTORY     had d& c   RIGHT/LEFT HEART CATH AND CORONARY ANGIOGRAPHY N/A 11/18/2016   Procedure: Right/Left Heart Cath and Coronary Angiography;  Surgeon: Runell Gess, MD;  Location: [facility] INVASIVE CV LAB;  Service: Cardiovascular;  Laterality: N/A;   TEE WITHOUT CARDIOVERSION N/A 01/27/2017   Procedure: TRANSESOPHAGEAL ECHOCARDIOGRAM (TEE);  Surgeon: Thurmon Fair, MD;  Location: [facility] ENDOSCOPY;  Service: Cardiovascular;  Laterality: N/A;    Allergies:  Allergies  Allergen Reactions   Cat Hair Extract Other (See Comments)   Codeine Nausea Only    Has withdrawal when trying to stop med   Dust Mite Extract Other (See Comments)    Family History:  Family History  Problem Relation Age of Onset   Hypertension Mother    Thyroid disease Mother        Hypothyroid   Hyperlipidemia Mother    Cancer Father    Diabetes Father    CAD Father    Alcohol abuse Brother    Breast cancer Maternal Aunt        onset at 58   Colon cancer Maternal Uncle        under 50 at onset   Lung  cancer Maternal Uncle    Breast cancer Paternal Aunt        unsure of onset age   Endometrial cancer Maternal Grandmother    Hypertension Maternal Grandfather    Stroke Maternal Grandfather    Hyperlipidemia Maternal Grandfather    Bipolar disorder Daughter        patient reports daughter does not have bipolar disorder   Breast cancer Other        breast cancer    Social History:  Social History   Tobacco Use   Smoking status: Former    Types: Cigarettes   Smokeless tobacco: Never   Tobacco comments:    Smoked in college occasionally  Vaping Use   Vaping status: Never Used  Substance Use Topics   Alcohol use: Yes    Comment: rarely   Drug use: No    Review of symptoms:   Constitutional:  Negative for unexplained weight loss, night sweats, fever, chills ENT:  Negative for nose bleeds, sinus pain, painful swallowing CV:  Negative for chest pain, shortness of breath, exercise intolerance, palpitations, loss of consciousness Resp:  Negative for cough, wheezing, shortness of breath GI:  Negative for nausea, vomiting, diarrhea, bloody stools GU:  Positives noted in HPI; otherwise negative for gross hematuria, urinary incontinence Neuro:  Negative for seizures, poor balance, limb weakness, slurred speech Psych:  Negative for lack of energy, depression, anxiety Endocrine:  Negative for polydipsia, polyuria, symptoms of hypoglycemia (dizziness, hunger, sweating) Hematologic:  Negative for anemia, purpura, petechia, prolonged or excessive bleeding, use of anticoagulants  Allergic:  Negative for difficulty breathing or choking as a result of exposure to anything; no shellfish allergy; no allergic response (rash/itch) to materials, foods  Physical exam: BP 132/88   Pulse (!) 101   Ht 5\' 6"  (1.676 m)   Wt 167 lb (75.8 kg)   LMP  (LMP Unknown)   BMI 26.95 kg/m  GENERAL APPEARANCE:  Well appearing, well developed, well nourished, NAD HEENT: Atraumatic, Normocephalic, oropharynx clear. NECK: Supple without lymphadenopathy or thyromegaly. LUNGS: Clear to auscultation bilaterally. HEART: Regular Rate and Rhythm without murmurs, gallops, or rubs. ABDOMEN: Soft, non-tender, No Masses. EXTREMITIES: Moves all extremities well.  Without clubbing, cyanosis, or edema. NEUROLOGIC:  Alert and oriented x 3, normal gait, CN II-XII grossly intact.  MENTAL STATUS:  Appropriate. BACK:  Non-tender to palpation.  No CVAT SKIN:  Warm, dry and intact.    Results: U/A:  negative  PVR: 0 ml

## 2023-05-19 ENCOUNTER — Ambulatory Visit (HOSPITAL_BASED_OUTPATIENT_CLINIC_OR_DEPARTMENT_OTHER)
Admission: RE | Admit: 2023-05-19 | Discharge: 2023-05-19 | Disposition: A | Discharge status: Home / Self Care | Payer: No Typology Code available for payment source | Source: Ambulatory Visit | Attending: Urology | Admitting: Urology

## 2023-05-19 ENCOUNTER — Encounter: Payer: Self-pay | Admitting: Urology

## 2023-05-19 DIAGNOSIS — N390 Urinary tract infection, site not specified: Secondary | ICD-10-CM | POA: Diagnosis present

## 2023-06-10 ENCOUNTER — Ambulatory Visit: Payer: No Typology Code available for payment source | Admitting: Urology

## 2023-06-10 ENCOUNTER — Encounter: Payer: Self-pay | Admitting: Urology

## 2023-06-10 VITALS — BP 139/85 | HR 76 | Ht 66.0 in | Wt 168.0 lb

## 2023-06-10 DIAGNOSIS — N390 Urinary tract infection, site not specified: Secondary | ICD-10-CM

## 2023-06-10 DIAGNOSIS — Z87440 Personal history of urinary (tract) infections: Secondary | ICD-10-CM | POA: Diagnosis not present

## 2023-06-10 DIAGNOSIS — Z09 Encounter for follow-up examination after completed treatment for conditions other than malignant neoplasm: Secondary | ICD-10-CM

## 2023-06-10 LAB — URINALYSIS, ROUTINE W REFLEX MICROSCOPIC
Bilirubin, UA: NEGATIVE
Glucose, UA: NEGATIVE
Ketones, UA: NEGATIVE
Leukocytes,UA: NEGATIVE
Nitrite, UA: NEGATIVE
Protein,UA: NEGATIVE
RBC, UA: NEGATIVE
Specific Gravity, UA: 1.015 (ref 1.005–1.030)
Urobilinogen, Ur: 0.2 mg/dL (ref 0.2–1.0)
pH, UA: 7 (ref 5.0–7.5)

## 2023-06-10 NOTE — Progress Notes (Signed)
 Assessment: 1. Frequent UTI     Plan: Continue methods to reduce the risk of UTIs including increase fluid intake, timed and double voiding, daily cranberry supplement, daily probiotic, and vaginal hormone replacement. Continue vaginal hormone therapy Continue macrobid  100 mg daily for UTI prevention.   Return to office in 2 months  Chief Complaint:  Chief Complaint  Patient presents with   Frequent UTI    History of Present Illness:  Charlene Carey is a 47 y.o. female who is seen for continued evaluation of frequent UTIs. She has been treated for 3 UTIs since September 2024.  No history of UTIs prior to this.  She had onset of some bladder discomfort in mid September 2024.  Her symptoms came on fairly quickly.  She reported some slight dysuria.  No gross hematuria or flank pain.  No fevers or chills.  Urine culture results: 02/21/23 >100K E. Coli - treated with Macrobid  x 5 days 04/10/23 >100K E. Coli - treated with Cefdinir  x 7 days  U/A from 04/27/23 for UTI symptoms showed large LE, + nitrite. No culture obtained.  Treated with Bactrim  x 7 days.  Her symptoms resolved after antibiotic therapy.  She is not having any UTI symptoms today.  She completed the Bactrim  approximately 3 days ago. No history of kidney stones.  No recent imaging. She does have baseline symptoms of frequency, nocturia, and occasional urgency.  She was given a prescription for estradiol  vaginal tablets. She was started on daily Macrobid  for UTI prevention in November 2024. Renal ultrasound from 12/24 showed no evidence of hydronephrosis or renal mass.  She returns today for follow-up.  She continues on daily Macrobid  for UTI prevention.  She is doing very well.  No recent UTI symptoms.  She is using the estradiol  vaginal tablets.  Portions of the above documentation were copied from a prior visit for review purposes only.    Past Medical History:  Past Medical History:  Diagnosis Date    Allergy-induced asthma    Aortic stenosis with bicuspid valve    Arthritis    Ascending aortic aneurysm (HCC)    Frequent headaches    Heartburn    History of chicken pox    History of gestational diabetes    Hyperglycemia    Multinodular goiter    Seasonal allergies    Subclinical hyperthyroidism     Past Surgical History:  Past Surgical History:  Procedure Laterality Date   BREAST REDUCTION SURGERY     OTHER SURGICAL HISTORY     had d& c   RIGHT/LEFT HEART CATH AND CORONARY ANGIOGRAPHY N/A 11/18/2016   Procedure: Right/Left Heart Cath and Coronary Angiography;  Surgeon: Court Dorn PARAS, MD;  Location: [facility] INVASIVE CV LAB;  Service: Cardiovascular;  Laterality: N/A;   TEE WITHOUT CARDIOVERSION N/A 01/27/2017   Procedure: TRANSESOPHAGEAL ECHOCARDIOGRAM (TEE);  Surgeon: Francyne Headland, MD;  Location: [facility] ENDOSCOPY;  Service: Cardiovascular;  Laterality: N/A;    Allergies:  Allergies  Allergen Reactions   Cat Hair Extract Other (See Comments)   Codeine Nausea Only    Has withdrawal when trying to stop med   Dust Mite Extract Other (See Comments)    Family History:  Family History  Problem Relation Age of Onset   Hypertension Mother    Thyroid  disease Mother        Hypothyroid   Hyperlipidemia Mother    Cancer Father    Diabetes Father    CAD Father    Alcohol abuse  Brother    Breast cancer Maternal Aunt        onset at 29   Colon cancer Maternal Uncle        under 50 at onset   Lung cancer Maternal Uncle    Breast cancer Paternal Aunt        unsure of onset age   Endometrial cancer Maternal Grandmother    Hypertension Maternal Grandfather    Stroke Maternal Grandfather    Hyperlipidemia Maternal Grandfather    Bipolar disorder Daughter        patient reports daughter does not have bipolar disorder   Breast cancer Other        breast cancer    Social History:  Social History   Tobacco Use   Smoking status: Former    Types: Cigarettes   Smokeless  tobacco: Never   Tobacco comments:    Smoked in college occasionally  Vaping Use   Vaping status: Never Used  Substance Use Topics   Alcohol use: Yes    Comment: rarely   Drug use: No    ROS: Constitutional:  Negative for fever, chills, weight loss CV: Negative for chest pain, previous MI, hypertension Respiratory:  Negative for shortness of breath, wheezing, sleep apnea, frequent cough GI:  Negative for nausea, vomiting, bloody stool, GERD  Physical exam: BP 139/85   Pulse 76   Ht 5' 6 (1.676 m)   Wt 168 lb (76.2 kg)   LMP  (LMP Unknown)   BMI 27.12 kg/m  GENERAL APPEARANCE:  Well appearing, well developed, well nourished, NAD HEENT:  Atraumatic, normocephalic, oropharynx clear NECK:  Supple without lymphadenopathy or thyromegaly ABDOMEN:  Soft, non-tender, no masses EXTREMITIES:  Moves all extremities well, without clubbing, cyanosis, or edema NEUROLOGIC:  Alert and oriented x 3, normal gait, CN II-XII grossly intact MENTAL STATUS:  appropriate BACK:  Non-tender to palpation, No CVAT SKIN:  Warm, dry, and intact  Results: U/A: negative

## 2023-11-11 ENCOUNTER — Telehealth: Payer: Self-pay

## 2023-11-11 DIAGNOSIS — I712 Thoracic aortic aneurysm, without rupture, unspecified: Secondary | ICD-10-CM

## 2023-11-13 LAB — HM MAMMOGRAPHY

## 2023-11-18 ENCOUNTER — Encounter: Payer: Self-pay | Admitting: Family

## 2023-11-28 ENCOUNTER — Ambulatory Visit (HOSPITAL_COMMUNITY)
Admission: RE | Admit: 2023-11-28 | Discharge: 2023-11-28 | Disposition: A | Discharge status: Home / Self Care | Source: Ambulatory Visit | Attending: Cardiovascular Disease | Admitting: Cardiovascular Disease

## 2023-11-28 DIAGNOSIS — E042 Nontoxic multinodular goiter: Secondary | ICD-10-CM | POA: Insufficient documentation

## 2023-11-28 DIAGNOSIS — I712 Thoracic aortic aneurysm, without rupture, unspecified: Secondary | ICD-10-CM | POA: Diagnosis present

## 2023-11-28 MED ORDER — IOHEXOL 350 MG/ML SOLN
75.0000 mL | Freq: Once | INTRAVENOUS | Status: AC | PRN
  Administered 2023-11-28: 75 mL via INTRAVENOUS

## 2023-12-04 NOTE — Telephone Encounter (Signed)
 Orders placed for 2 year follow up of thoracic aortic aneurysm per Dr. Court. Mychart message sent to pt.

## 2023-12-07 ENCOUNTER — Ambulatory Visit: Payer: Self-pay | Admitting: Cardiovascular Disease

## 2023-12-08 ENCOUNTER — Other Ambulatory Visit: Payer: Self-pay | Admitting: *Deleted

## 2023-12-08 DIAGNOSIS — I712 Thoracic aortic aneurysm, without rupture, unspecified: Secondary | ICD-10-CM

## 2024-05-18 ENCOUNTER — Ambulatory Visit: Admitting: Nurse Practitioner

## 2024-05-18 ENCOUNTER — Encounter: Payer: Self-pay | Admitting: Nurse Practitioner

## 2024-05-18 VITALS — BP 114/78 | HR 77 | Temp 98.1°F

## 2024-05-18 DIAGNOSIS — N8110 Cystocele, unspecified: Secondary | ICD-10-CM

## 2024-05-18 DIAGNOSIS — N393 Stress incontinence (female) (male): Secondary | ICD-10-CM

## 2024-05-18 DIAGNOSIS — N898 Other specified noninflammatory disorders of vagina: Secondary | ICD-10-CM

## 2024-05-18 DIAGNOSIS — R350 Frequency of micturition: Secondary | ICD-10-CM

## 2024-05-18 LAB — URINALYSIS, COMPLETE W/RFL CULTURE
Bacteria, UA: NONE SEEN /HPF
Bilirubin Urine: NEGATIVE
Glucose, UA: NEGATIVE
Hgb urine dipstick: NEGATIVE
Hyaline Cast: NONE SEEN /LPF
Ketones, ur: NEGATIVE
Leukocyte Esterase: NEGATIVE
Nitrites, Initial: NEGATIVE
Protein, ur: NEGATIVE
RBC / HPF: NONE SEEN /HPF (ref 0–2)
Specific Gravity, Urine: 1.01 (ref 1.001–1.035)
WBC, UA: NONE SEEN /HPF (ref 0–5)
pH: 6 (ref 5.0–8.0)

## 2024-05-18 LAB — WET PREP FOR TRICH, YEAST, CLUE

## 2024-05-18 LAB — NO CULTURE INDICATED

## 2024-05-18 MED ORDER — ESTRADIOL 0.01 % VA CREA: 1.0000 g | TOPICAL_CREAM | VAGINAL | 0 refills | Status: DC

## 2024-05-18 NOTE — Progress Notes (Signed)
   Acute Office Visit  Subjective:    Patient ID: Charlene Carey, female    DOB: 1975-08-21, 48 y.o.   MRN: [id number]   HPI 48 y.o. presents today for intermittent cramping, urinary symptoms, and vaginal dryness. Feels her bladder has dropped. Reports urgency, frequency, stress incontinence, sometimes feel like she does not fully empty bladder. Using vaginal estrogen twice a week for dryness and h/o frequent UTIs. Feels she is still dry. Has seen urology.   No LMP recorded (lmp unknown). Patient is postmenopausal.    Review of Systems  Constitutional: Negative.   Genitourinary:  Positive for dyspareunia, frequency, pelvic pain (Pressure) and urgency. Negative for difficulty urinating, dysuria, flank pain, hematuria, vaginal bleeding, vaginal discharge and vaginal pain.       Objective:    Physical Exam Constitutional:      Appearance: Normal appearance.  Genitourinary:    General: Normal vulva.     Vagina: Normal.     Cervix: Normal.     Uterus: Normal.      Adnexa: Right adnexa normal and left adnexa normal.     Comments: Grade 1-2 cystocele    BP 114/78   Pulse 77   Temp 98.1 F (36.7 C) (Oral)   LMP  (LMP Unknown)   SpO2 97%  Wt Readings from Last 3 Encounters:  06/10/23 168 lb (76.2 kg)  05/07/23 167 lb (75.8 kg)  12/19/22 171 lb (77.6 kg)        Charlene Carey, CMA present as biomedical engineer.   UA negative  Wet prep negative for pathogens  Assessment & Plan:   Problem List Items Addressed This Visit   None Visit Diagnoses       Urinary frequency    -  Primary   Relevant Orders   Urinalysis,Complete w/RFL Culture   Ambulatory referral to Physical Therapy     Vaginal discharge       Relevant Orders   WET PREP FOR TRICH, YEAST, CLUE     Charlene Carey grade 1 cystocele       Relevant Orders   Ambulatory referral to Physical Therapy     Stress incontinence       Relevant Orders   Ambulatory referral to Physical Therapy     Vaginal dryness        Relevant Medications   estradiol  (ESTRACE ) 0.01 % CREA vaginal cream (Start on 05/19/2024)      Plan:  Negative UA and wet prep. Mild cystocele. Referral for PFPT provided. Option to increase vaginal estrogen to 3 times per week or switch to cream. Wants to try cream.   Return if symptoms worsen or fail to improve.    Charlene DELENA Shutter DNP, 2:09 PM 05/18/2024

## 2024-07-14 ENCOUNTER — Encounter: Payer: Self-pay | Admitting: Radiology

## 2024-07-14 ENCOUNTER — Ambulatory Visit: Admitting: Radiology

## 2024-07-14 VITALS — BP 116/78 | HR 72 | Ht 65.75 in | Wt 167.0 lb

## 2024-07-14 DIAGNOSIS — Z01419 Encounter for gynecological examination (general) (routine) without abnormal findings: Secondary | ICD-10-CM | POA: Diagnosis not present

## 2024-07-14 DIAGNOSIS — N951 Menopausal and female climacteric states: Secondary | ICD-10-CM

## 2024-07-14 DIAGNOSIS — N898 Other specified noninflammatory disorders of vagina: Secondary | ICD-10-CM

## 2024-07-14 DIAGNOSIS — Z1331 Encounter for screening for depression: Secondary | ICD-10-CM

## 2024-07-14 MED ORDER — ESTRADIOL 0.01 % VA CREA: 1.0000 g | TOPICAL_CREAM | VAGINAL | 6 refills | Status: AC

## 2024-07-14 NOTE — Progress Notes (Signed)
" ° °  Charlene Carey 1976/02/25 [id number]   History:  49 y.o. G5P3 presents for annual exam. C/O fatigue, mood changes, sadness, dry skin, vaginal dryness, heavy feeling in her vagina. Interested in HRT. On vit D for deficiency. Labs with PCP. Hx of aortic stenosis.  Gynecologic History No LMP recorded (lmp unknown). Patient is postmenopausal.   Contraception/Family planning: none Sexually active: yes Last Pap: 2022. Results were: normal per pt, done at [facility] Last mammogram: 11/07/22. Results were: normal  Obstetric History OB History  Gravida Para Term Preterm AB Living  5 3 3   3   SAB IAB Ectopic Multiple Live Births      3    # Outcome Date GA Lbr Len/2nd Weight Sex Type Anes PTL Lv  5 Gravida           4 Gravida           3 Term           2 Term           1 Term              The following portions of the patient's history were reviewed and updated as appropriate: allergies, current medications, past family history, past medical history, past social history, past surgical history, and problem list.  Review of Systems Pertinent items noted in HPI and remainder of comprehensive ROS otherwise negative.   Past medical history, past surgical history, family history and social history were all reviewed and documented in the EPIC chart.   Exam:  Vitals:   07/14/24 1339  BP: 116/78  Pulse: 72  SpO2: 97%  Weight: 167 lb (75.8 kg)  Height: 5' 5.75 (1.67 m)   Body mass index is 27.16 kg/m.  General appearance:  Normal Thyroid :  Symmetrical, normal in size, without palpable masses or nodularity. Respiratory  Auscultation:  Clear without wheezing or rhonchi Cardiovascular  Auscultation:  Regular rate, without rubs, murmurs or gallops  Edema/varicosities:  Not grossly evident Abdominal  Soft,nontender, without masses, guarding or rebound.  Liver/spleen:  No organomegaly noted  Hernia:  None appreciated  Skin  Inspection:  Grossly normal Breasts: Examined lying  and sitting.   Right: Without masses, retractions, nipple discharge or axillary adenopathy.   Left: Without masses, retractions, nipple discharge or axillary adenopathy. Genitourinary   Inguinal/mons:  Normal without inguinal adenopathy  External genitalia:  Normal appearing vulva with no masses, tenderness, or lesions  BUS/Urethra/Skene's glands:  Normal without masses or exudate  Vagina:  Normal appearing with normal color and discharge, no lesions. Moderately atrophic.  Cervix:  Normal appearing without discharge or lesions  Uterus:  Normal in size, shape and contour.  Mobile, nontender  Adnexa/parametria:     Rt: Normal in size, without masses or tenderness.   Lt: Normal in size, without masses or tenderness.  Anus and perineum: Normal   Darice Hoit, CMA present for exam  Assessment/Plan:   1. Well woman exam with routine gynecological exam (Primary) - Cytology - PAP( [facility])  2. Menopausal symptoms - Estradiol , Ultra Sens - Will contact with results and manage accordingly  3. Vaginal dryness - estradiol  (ESTRACE ) 0.01 % CREA vaginal cream; Place 1 g vaginally 3 (three) times a week.  Dispense: 42.5 g; Refill: 6  4. Depression screening    Return in 1 year for annual or as needed.   Margarite Vessel B WHNP-BC 2:43 PM 07/14/2024  "

## 2024-07-15 ENCOUNTER — Ambulatory Visit: Admitting: Physical Therapy

## 2024-07-15 ENCOUNTER — Ambulatory Visit: Payer: Self-pay | Admitting: Radiology

## 2024-07-15 DIAGNOSIS — R102 Pelvic and perineal pain unspecified side: Secondary | ICD-10-CM

## 2024-07-15 DIAGNOSIS — R279 Unspecified lack of coordination: Secondary | ICD-10-CM

## 2024-07-15 DIAGNOSIS — M6281 Muscle weakness (generalized): Secondary | ICD-10-CM

## 2024-07-15 DIAGNOSIS — R293 Abnormal posture: Secondary | ICD-10-CM

## 2024-07-15 LAB — CYTOLOGY - PAP
Adequacy: ABSENT
Comment: NEGATIVE
Diagnosis: NEGATIVE
High risk HPV: NEGATIVE

## 2024-07-15 NOTE — Therapy (Signed)
 " OUTPATIENT PHYSICAL THERAPY FEMALE PELVIC EVALUATION   Patient Name: Charlene Carey MRN: [id number] DOB:1975-08-22, 49 y.o., female Today's Date: 07/15/2024  END OF SESSION:  PT End of Session - 07/15/24 1540     Visit Number 1    Number of Visits 8    Date for Recertification  01/12/25    Authorization Type [employer] Preferred    Authorization Time Period no auth req    PT Start Time 0215    PT Stop Time 0245    PT Time Calculation (min) 30 min    Activity Tolerance Patient tolerated treatment well    Behavior During Therapy WFL for tasks assessed/performed          Past Medical History:  Diagnosis Date   Allergy-induced asthma    Aortic stenosis with bicuspid valve    Arthritis    Ascending aortic aneurysm    Frequent headaches    Heartburn    History of chicken pox    History of gestational diabetes    Hyperglycemia    Multinodular goiter    Seasonal allergies    Subclinical hyperthyroidism    Past Surgical History:  Procedure Laterality Date   BREAST REDUCTION SURGERY     OTHER SURGICAL HISTORY     had d& c   RIGHT/LEFT HEART CATH AND CORONARY ANGIOGRAPHY N/A 11/18/2016   Procedure: Right/Left Heart Cath and Coronary Angiography;  Surgeon: Court Dorn PARAS, MD;  Location: [facility];  Service: Cardiovascular;  Laterality: N/A;   TEE WITHOUT CARDIOVERSION N/A 01/27/2017   Procedure: TRANSESOPHAGEAL ECHOCARDIOGRAM (TEE);  Surgeon: Francyne Headland, MD;  Location: [facility] ENDOSCOPY;  Service: Cardiovascular;  Laterality: N/A;   Patient Active Problem List   Diagnosis Date Noted   Frequent UTI 05/07/2023   Hyperlipidemia 11/27/2022   Lumbar radiculopathy 11/06/2022   Positive ANA (antinuclear antibody) 11/06/2022   Elevated blood pressure reading 10/07/2022   Generalized weakness 10/07/2022   Preventative health care 01/31/2021   Subclinical hyperthyroidism 12/21/2020   Hyperglycemia 12/21/2020   Irregular menses 12/20/2020   Paresthesia  12/20/2020   Vitamin D  deficiency 12/20/2020   Carpal tunnel syndrome of left wrist 12/20/2020   Anxiety 12/20/2020   Ascending aortic aneurysm    Multinodular goiter    Atypical chest pain 10/16/2016   Dyspnea on exertion 10/16/2016   Aortic stenosis due to bicuspid aortic valve    PCP: Daryl Setter, NP  REFERRING PROVIDER: Prentiss Annabella DELENA, NP  REFERRING DIAG: R35.0 (ICD-10-CM) - Urinary frequency N81.10 (ICD-10-CM) - Baden-Walker grade 1 cystocele N39.3 (ICD-10-CM) - Stress incontinence  THERAPY DIAG:  Muscle weakness (generalized)  Abnormal posture  Unspecified lack of coordination  Pelvic pain  Rationale for Evaluation and Treatment: Rehabilitation  ONSET DATE: stress incontinence: a little after birth of second child; 2011   SUBJECTIVE:  SUBJECTIVE STATEMENT: Eval: Patient reports that heaviness/fullness has been present since she was diagnosed with menopause. She is currently using estradiol  3x/week. She has noticed more comfort in the vaginal region since using the Estradiol .   FUNCTIONAL LIMITATIONS: just frustrating   PERTINENT HISTORY:  Medications for current condition: estradiol  (ESTRACE ) 0.01 % CREA vaginal cream  Surgeries: d&c unknown date  Other: obgyn referral states mild cystocele  Sexual abuse: No  PAIN:  Are you having pain? Yes NPRS scale: 1-2/10 Pain location: vaginal region fullness   Pain type: sharpness occasionally and fullness  Pain description: intermittent   Aggravating factors: unknown  Relieving factors: using the restroom   PRECAUTIONS: None  RED FLAGS: None   WEIGHT BEARING RESTRICTIONS: No  FALLS:  Has patient fallen in last 6 months? No  OCCUPATION: stays at home   ACTIVITY LEVEL : on the feet   PLOF: Independent  PATIENT  GOALS: decrease fullness in pelvic floor/heaviness in pelvic floor   BOWEL MOVEMENT:  Pain with bowel movement: No Type of bowel movement:Type (Bristol Stool Scale) 4, Frequency within normal limits , Strain no , and Splinting no  Fully empty rectum: Yes:   Leakage: No                                                  Caused by:  Bowel urgency: no Pads: No Fiber supplement/laxative No  URINATION: constantly feeling like she has to go  Pain with urination: No Fully empty bladder: No                                         Post-void dribble: No Stream: Strong Urgency: Yes  Frequency:during the day within normal limits                                                         Nocturia: No   Leakage: Coughing, Sneezing, and Laughing Pads/briefs: Yes: estradiol    INTERCOURSE: no issues here   Ability to have vaginal penetration Yes  Pain with intercourse: none Dryness: Yes  Climax: -- Marinoff Scale: 0/3 Lubricant: yes   PREGNANCY: Vaginal deliveries 3 - 2011 Tearing Yes:   Episiotomy Yes  C-section deliveries 0 Currently pregnant No  PROLAPSE: Pressure  OBJECTIVE:  Note: Objective measures were completed at Evaluation unless otherwise noted.  PATIENT SURVEYS:  PFIQ-7: 22  COGNITION: Overall cognitive status: Within functional limits for tasks assessed     SENSATION: Light touch: Appears intact  LUMBAR SPECIAL TESTS:  Straight leg raise test: Positive  FUNCTIONAL TESTS:  Single leg stance:  Rt: +   Lt: +  Sit-up test: 1/3  Squat: within functional limits  Bed mobility: within functional limits   GAIT: Assistive device utilized: None Comments: mild trendelenburg gait pattern with ambulation   POSTURE: rounded shoulders  LUMBARAROM/PROM: within normal limits for all motions tested bilaterally with no pain   LOWER EXTREMITY ROM: within normal limits for all motions tested bilaterally with no pain   LOWER EXTREMITY MMT: 3/5 bilateral knees and hips  grossly   PALPATION:  General:  no tenderness to palpation of bilateral adductors or hip flexors in hooklying   Pelvic Alignment: slight posterior pelvic tilt   Abdominal: abdominal bracing at rest   Diastasis: No Distortion: No  Breathing: apical breathing pattern with decreased lower rib excursion with inhalation/exhalation  Scar tissue: No Active Straight Leg Raise: + bilateral                 External Perineal Exam: dryness present with limited clitoral hood mobility                             Internal Pelvic Floor: Patient fully consents to today's internal examination vaginally. She demonstrates low tone in bilateral aspects of superficial and deep pelvic floor musculature with no palpable trigger points or pain. She has a weakened pelvic floor contraction that improves when paired with a diaphragmatic breath and there is stiffness present in vaginal walls.  Patient confirms identification and approves PT to assess internal pelvic floor and treatment Yes No emotional/communication barriers or cognitive limitation. Patient is motivated to learn. Patient understands and agrees with treatment goals and plan. PT explains patient will be examined in standing, sitting, and lying down to see how their muscles and joints work. When they are ready, they will be asked to remove their underwear so PT can examine their perineum. The patient is also given the option of providing their own chaperone as one is not provided in our facility. The patient also has the right and is explained the right to defer or refuse any part of the evaluation or treatment including the internal exam. With the patient's consent, PT will use one gloved finger to gently assess the muscles of the pelvic floor, seeing how well it contracts and relaxes and if there is muscle symmetry. After, the patient will get dressed and PT and patient will discuss exam findings and plan of care. PT and patient discuss plan of care, schedule,  attendance policy and HEP activities. All internal or external pelvic floor assessments and/or treatments are completed with proper hand hygiene and gloves hands. If needed gloves are changed with hand hygiene during patient care time.  PELVIC MMT: MMT eval  Vaginal 2/5, 2 quick flicks,1 sec hold  Internal Anal Sphincter   External Anal Sphincter   Puborectalis   (Blank rows = not tested)       TONE: Low in bilateral aspects of superficial and deep pelvic floor musculature  PROLAPSE: Mild anterior vaginal wall laxity present with cough test in hooklying   TODAY'S TREATMENT:                                                                                                                              DATE:   EVAL Examination completed, findings reviewed, pt educated on POC, HEP, and self care. Pt motivated to participate in PT and agreeable to attempt recommendations.    Exercises -  Supine Pelvic Floor Contraction  - 1 x daily - 7 x weekly - 3 sets - 10 reps - Quick Flick Pelvic Floor Contractions in Hooklying  - 1 x daily - 7 x weekly - 3 sets - 10 reps  Patient Education - Get To Know Your Pelvic Floor- Female  PATIENT EDUCATION:  Education details: relative anatomy and the connection between the diaphragm and pelvic floor  Person educated: Patient Education method: Explanation, Demonstration, Tactile cues, Verbal cues, and Handouts Education comprehension: verbalized understanding, returned demonstration, verbal cues required, tactile cues required, and needs further education  HOME EXERCISE PROGRAM: Access Code: [email or web address] URL: [email or web address] Date: 07/15/2024 Prepared by: Celena Domino  Exercises - Supine Pelvic Floor Contraction  - 1 x daily - 7 x weekly - 3 sets - 10 reps - Quick Flick Pelvic Floor Contractions in Hooklying  - 1 x daily - 7 x weekly - 3 sets - 10 reps  Patient Education - Get To Know Your Pelvic Floor- Female  ASSESSMENT:  CLINICAL  IMPRESSION: Patient is a 48 y.o. female  who was seen today for physical therapy evaluation and treatment for pelvic floor heaviness/fullness secondary to grade 1 cystocele prolapse. She has dealt with stress urinary incontinence since the birth of her last child and since being diagnosed with menopause, pt has experienced a fullness/heaviness sensation in the lower abdominal region/pelvic floor. Patient reports that heaviness/fullness has been present since she was diagnosed with menopause. She is currently using estradiol  3x/week. She has noticed more comfort in the vaginal region since using the Estradiol . Patient fully consents to today's internal examination vaginally. She demonstrates low tone in bilateral aspects of superficial and deep pelvic floor musculature with no palpable trigger points or pain. She has a weakened pelvic floor contraction that improves when paired with a diaphragmatic breath and there is stiffness present in vaginal walls. Overall, pt tolerated session well and Pt would benefit from additional PT to further address deficits.    OBJECTIVE IMPAIRMENTS: decreased coordination, decreased endurance, decreased mobility, decreased ROM, decreased strength, and pain.   ACTIVITY LIMITATIONS: carrying, lifting, bending, sitting, standing, squatting, stairs, transfers, continence, and toileting  PARTICIPATION LIMITATIONS: driving, shopping, community activity, occupation, and yard work  PERSONAL FACTORS: Age, Past/current experiences, and Time since onset of injury/illness/exacerbation are also affecting patient's functional outcome.   REHAB POTENTIAL: Good  CLINICAL DECISION MAKING: Stable/uncomplicated  EVALUATION COMPLEXITY: Low   GOALS: Goals reviewed with patient? Yes  SHORT TERM GOALS: Target date: 08/12/2024  Pt will be independent with HEP.  Baseline: Goal status: INITIAL  2.  Pt will be independent with diaphragmatic breathing and down training activities in order  to improve pelvic floor relaxation. Baseline:  Goal status: INITIAL  3.  Pt will be independent with use of squatty potty, relaxed toileting mechanics, and improved bowel movement techniques in order to increase ease of bowel movements and complete evacuation.  Baseline:  Goal status: INITIAL  4.  Pt will be independent with the knack, urge suppression technique, and double voiding in order to improve bladder habits and decrease urinary incontinence.  Baseline:  Goal status: INITIAL  LONG TERM GOALS: Target date: 01/12/2025  Pt will be independent with advanced HEP.  Baseline:  Goal status: INITIAL  2.  Pt to demonstrate improved coordination of pelvic floor and breathing mechanics with 10# squat with appropriate synergistic patterns to decrease pain and leakage at least 75% of the time for improved ability to complete a 30 minute workout with strain at pelvic  floor and symptoms.   Baseline:  Goal status: INITIAL  3.  Pt will be able to correctly perform diaphragmatic breathing and appropriate pressure management in order to prevent worsening vaginal wall laxity and improve pelvic floor A/ROM.  Baseline:  Goal status: INITIAL  4.  Pt will demonstrate normal pelvic floor muscle tone and A/ROM, able to achieve 3/5 strength with contractions and 10 sec endurance, in order to reduce urinary leaking and number of pads patient wears.  Baseline:  Goal status: INITIAL  PLAN:  PT FREQUENCY: 1-2x/week  PT DURATION: 6 months  PLANNED INTERVENTIONS: 97110-Therapeutic exercises, 97530- Therapeutic activity, 97112- Neuromuscular re-education, 97535- Self Care, [id number]- Manual therapy, Patient/Family education, Stair training, Taping, Joint mobilization, Spinal mobilization, Scar mobilization, Cryotherapy, Moist heat, and Biofeedback  PLAN FOR NEXT SESSION: continued pelvic floor training in seated; introduce knack and urge drill if needed; toileting mechanics for optimal emptying; strengthening  to back/core/hips  Celena JAYSON Domino, PT 07/15/2024, 3:41 PM  "

## 2024-08-24 ENCOUNTER — Ambulatory Visit: Admitting: Physical Therapy

## 2024-09-01 ENCOUNTER — Ambulatory Visit: Admitting: Physical Therapy

## 2024-09-07 ENCOUNTER — Ambulatory Visit: Admitting: Physical Therapy

## 2024-09-14 ENCOUNTER — Ambulatory Visit: Admitting: Physical Therapy
